# Patient Record
Sex: Female | Born: 1965 | Race: White | Hispanic: No | Smoking: Current every day smoker
Health system: Southern US, Community
[De-identification: ages and names within clinical notes are randomized; demographics above are authoritative.]

## PROBLEM LIST (undated history)

## (undated) DIAGNOSIS — M549 Dorsalgia, unspecified: Secondary | ICD-10-CM

## (undated) DIAGNOSIS — I10 Essential (primary) hypertension: Secondary | ICD-10-CM

## (undated) DIAGNOSIS — B1920 Unspecified viral hepatitis C without hepatic coma: Secondary | ICD-10-CM

## (undated) DIAGNOSIS — J449 Chronic obstructive pulmonary disease, unspecified: Secondary | ICD-10-CM

## (undated) DIAGNOSIS — E119 Type 2 diabetes mellitus without complications: Secondary | ICD-10-CM

## (undated) DIAGNOSIS — F1910 Other psychoactive substance abuse, uncomplicated: Secondary | ICD-10-CM

## (undated) DIAGNOSIS — G8929 Other chronic pain: Secondary | ICD-10-CM

## (undated) HISTORY — PX: BACK SURGERY: SHX140

## (undated) HISTORY — PX: KNEE ARTHROSCOPY: SUR90

## (undated) HISTORY — PX: EXTERNAL FIXATION ARM: SHX1552

## (undated) HISTORY — PX: TUBAL LIGATION: SHX77

## (undated) HISTORY — PX: TONSILLECTOMY: SUR1361

---

## 1997-10-14 ENCOUNTER — Emergency Department (HOSPITAL_COMMUNITY): Admission: EM | Admit: 1997-10-14 | Discharge: 1997-10-15 | Payer: Self-pay | Admitting: Emergency Medicine

## 1997-10-15 ENCOUNTER — Ambulatory Visit (HOSPITAL_COMMUNITY): Admission: RE | Admit: 1997-10-15 | Discharge: 1997-10-15 | Payer: Self-pay | Admitting: Emergency Medicine

## 1997-12-24 ENCOUNTER — Ambulatory Visit (HOSPITAL_COMMUNITY): Admission: RE | Admit: 1997-12-24 | Discharge: 1997-12-24 | Payer: Self-pay | Admitting: Family Medicine

## 1998-10-24 ENCOUNTER — Encounter: Payer: Self-pay | Admitting: *Deleted

## 1998-10-24 ENCOUNTER — Ambulatory Visit (HOSPITAL_COMMUNITY): Admission: RE | Admit: 1998-10-24 | Discharge: 1998-10-24 | Payer: Self-pay | Admitting: *Deleted

## 1998-10-27 ENCOUNTER — Ambulatory Visit (HOSPITAL_COMMUNITY): Admission: RE | Admit: 1998-10-27 | Discharge: 1998-10-27 | Payer: Self-pay | Admitting: Family Medicine

## 1998-10-27 ENCOUNTER — Encounter: Payer: Self-pay | Admitting: Family Medicine

## 1999-01-04 ENCOUNTER — Encounter: Admission: RE | Admit: 1999-01-04 | Discharge: 1999-04-04 | Payer: Self-pay | Admitting: Anesthesiology

## 1999-07-28 ENCOUNTER — Encounter: Admission: RE | Admit: 1999-07-28 | Discharge: 1999-10-26 | Payer: Self-pay | Admitting: Anesthesiology

## 2000-04-03 ENCOUNTER — Emergency Department (HOSPITAL_COMMUNITY): Admission: EM | Admit: 2000-04-03 | Discharge: 2000-04-03 | Payer: Self-pay | Admitting: Emergency Medicine

## 2000-04-03 ENCOUNTER — Encounter: Payer: Self-pay | Admitting: Emergency Medicine

## 2000-06-21 ENCOUNTER — Emergency Department (HOSPITAL_COMMUNITY): Admission: EM | Admit: 2000-06-21 | Discharge: 2000-06-21 | Payer: Self-pay | Admitting: Emergency Medicine

## 2000-06-21 ENCOUNTER — Encounter: Payer: Self-pay | Admitting: Emergency Medicine

## 2001-02-11 ENCOUNTER — Encounter: Payer: Self-pay | Admitting: Emergency Medicine

## 2001-02-11 ENCOUNTER — Emergency Department (HOSPITAL_COMMUNITY): Admission: EM | Admit: 2001-02-11 | Discharge: 2001-02-11 | Payer: Self-pay | Admitting: Emergency Medicine

## 2001-10-20 ENCOUNTER — Emergency Department (HOSPITAL_COMMUNITY): Admission: EM | Admit: 2001-10-20 | Discharge: 2001-10-20 | Payer: Self-pay | Admitting: Emergency Medicine

## 2002-04-28 ENCOUNTER — Emergency Department (HOSPITAL_COMMUNITY): Admission: AC | Admit: 2002-04-28 | Discharge: 2002-04-29 | Payer: Self-pay

## 2002-04-28 ENCOUNTER — Encounter: Payer: Self-pay | Admitting: Emergency Medicine

## 2002-05-07 ENCOUNTER — Encounter: Admission: RE | Admit: 2002-05-07 | Discharge: 2002-06-11 | Payer: Self-pay | Admitting: *Deleted

## 2002-07-31 ENCOUNTER — Inpatient Hospital Stay (HOSPITAL_COMMUNITY): Admission: EM | Admit: 2002-07-31 | Discharge: 2002-08-05 | Payer: Self-pay | Admitting: *Deleted

## 2002-07-31 ENCOUNTER — Encounter: Payer: Self-pay | Admitting: *Deleted

## 2002-07-31 ENCOUNTER — Encounter: Payer: Self-pay | Admitting: Emergency Medicine

## 2002-08-01 ENCOUNTER — Encounter: Payer: Self-pay | Admitting: *Deleted

## 2002-08-02 ENCOUNTER — Encounter: Payer: Self-pay | Admitting: *Deleted

## 2002-08-04 ENCOUNTER — Encounter: Payer: Self-pay | Admitting: Internal Medicine

## 2002-08-05 ENCOUNTER — Inpatient Hospital Stay (HOSPITAL_COMMUNITY): Admission: EM | Admit: 2002-08-05 | Discharge: 2002-08-09 | Payer: Self-pay | Admitting: Psychiatry

## 2002-10-28 ENCOUNTER — Encounter: Payer: Self-pay | Admitting: Neurosurgery

## 2002-10-28 ENCOUNTER — Encounter: Admission: RE | Admit: 2002-10-28 | Discharge: 2002-10-28 | Payer: Self-pay | Admitting: Neurosurgery

## 2002-11-04 ENCOUNTER — Emergency Department (HOSPITAL_COMMUNITY): Admission: EM | Admit: 2002-11-04 | Discharge: 2002-11-05 | Payer: Self-pay | Admitting: Emergency Medicine

## 2002-11-05 ENCOUNTER — Encounter: Payer: Self-pay | Admitting: Emergency Medicine

## 2002-11-24 ENCOUNTER — Inpatient Hospital Stay (HOSPITAL_COMMUNITY): Admission: AD | Admit: 2002-11-24 | Discharge: 2002-11-29 | Payer: Self-pay | Admitting: Psychiatry

## 2002-12-02 ENCOUNTER — Encounter
Admission: RE | Admit: 2002-12-02 | Discharge: 2003-03-02 | Payer: Self-pay | Admitting: Physical Medicine & Rehabilitation

## 2003-03-26 ENCOUNTER — Encounter
Admission: RE | Admit: 2003-03-26 | Discharge: 2003-06-24 | Payer: Self-pay | Admitting: Physical Medicine & Rehabilitation

## 2003-04-12 ENCOUNTER — Encounter: Payer: Self-pay | Admitting: Emergency Medicine

## 2003-04-12 ENCOUNTER — Inpatient Hospital Stay (HOSPITAL_COMMUNITY): Admission: EM | Admit: 2003-04-12 | Discharge: 2003-04-21 | Payer: Self-pay | Admitting: Psychiatry

## 2003-04-26 ENCOUNTER — Emergency Department (HOSPITAL_COMMUNITY): Admission: EM | Admit: 2003-04-26 | Discharge: 2003-04-26 | Payer: Self-pay

## 2003-06-30 ENCOUNTER — Encounter
Admission: RE | Admit: 2003-06-30 | Discharge: 2003-09-28 | Payer: Self-pay | Admitting: Physical Medicine & Rehabilitation

## 2003-07-09 ENCOUNTER — Emergency Department (HOSPITAL_COMMUNITY): Admission: EM | Admit: 2003-07-09 | Discharge: 2003-07-09 | Payer: Self-pay | Admitting: Emergency Medicine

## 2003-07-13 ENCOUNTER — Inpatient Hospital Stay (HOSPITAL_COMMUNITY): Admission: EM | Admit: 2003-07-13 | Discharge: 2003-07-16 | Payer: Self-pay | Admitting: Emergency Medicine

## 2003-07-16 ENCOUNTER — Inpatient Hospital Stay (HOSPITAL_COMMUNITY): Admission: RE | Admit: 2003-07-16 | Discharge: 2003-07-25 | Payer: Self-pay | Admitting: *Deleted

## 2003-11-25 ENCOUNTER — Encounter
Admission: RE | Admit: 2003-11-25 | Discharge: 2004-01-24 | Payer: Self-pay | Admitting: Physical Medicine & Rehabilitation

## 2003-11-26 ENCOUNTER — Ambulatory Visit: Payer: Self-pay | Admitting: Physical Medicine & Rehabilitation

## 2004-01-24 ENCOUNTER — Encounter
Admission: RE | Admit: 2004-01-24 | Discharge: 2004-03-23 | Payer: Self-pay | Admitting: Physical Medicine & Rehabilitation

## 2004-01-26 ENCOUNTER — Ambulatory Visit: Payer: Self-pay | Admitting: Physical Medicine & Rehabilitation

## 2004-03-23 ENCOUNTER — Encounter
Admission: RE | Admit: 2004-03-23 | Discharge: 2004-06-21 | Payer: Self-pay | Admitting: Physical Medicine & Rehabilitation

## 2004-03-27 ENCOUNTER — Ambulatory Visit: Payer: Self-pay | Admitting: Physical Medicine & Rehabilitation

## 2004-05-17 ENCOUNTER — Ambulatory Visit: Payer: Self-pay | Admitting: Physical Medicine & Rehabilitation

## 2004-08-11 ENCOUNTER — Encounter
Admission: RE | Admit: 2004-08-11 | Discharge: 2004-11-09 | Payer: Self-pay | Admitting: Physical Medicine & Rehabilitation

## 2004-08-15 ENCOUNTER — Ambulatory Visit: Payer: Self-pay | Admitting: Physical Medicine & Rehabilitation

## 2004-08-18 ENCOUNTER — Encounter
Admission: RE | Admit: 2004-08-18 | Discharge: 2004-08-18 | Payer: Self-pay | Admitting: Physical Medicine & Rehabilitation

## 2004-09-28 ENCOUNTER — Ambulatory Visit: Payer: Self-pay | Admitting: Physical Medicine & Rehabilitation

## 2004-11-22 ENCOUNTER — Encounter
Admission: RE | Admit: 2004-11-22 | Discharge: 2005-02-20 | Payer: Self-pay | Admitting: Physical Medicine & Rehabilitation

## 2004-12-08 ENCOUNTER — Ambulatory Visit: Payer: Self-pay | Admitting: Physical Medicine & Rehabilitation

## 2005-01-09 ENCOUNTER — Encounter
Admission: RE | Admit: 2005-01-09 | Discharge: 2005-04-09 | Payer: Self-pay | Admitting: Physical Medicine & Rehabilitation

## 2005-01-09 ENCOUNTER — Ambulatory Visit: Payer: Self-pay | Admitting: Physical Medicine & Rehabilitation

## 2005-04-14 ENCOUNTER — Ambulatory Visit (HOSPITAL_COMMUNITY): Admission: RE | Admit: 2005-04-14 | Discharge: 2005-04-14 | Payer: Self-pay | Admitting: Neurology

## 2005-05-17 ENCOUNTER — Emergency Department (HOSPITAL_COMMUNITY): Admission: EM | Admit: 2005-05-17 | Discharge: 2005-05-17 | Payer: Self-pay | Admitting: Emergency Medicine

## 2005-05-24 ENCOUNTER — Encounter: Admission: RE | Admit: 2005-05-24 | Discharge: 2005-06-12 | Payer: Self-pay | Admitting: Orthopedic Surgery

## 2005-07-18 ENCOUNTER — Encounter: Payer: Self-pay | Admitting: Family Medicine

## 2005-07-31 ENCOUNTER — Ambulatory Visit: Payer: Self-pay | Admitting: Pulmonary Disease

## 2005-07-31 ENCOUNTER — Inpatient Hospital Stay (HOSPITAL_COMMUNITY): Admission: RE | Admit: 2005-07-31 | Discharge: 2005-08-01 | Payer: Self-pay | Admitting: Orthopedic Surgery

## 2005-08-21 ENCOUNTER — Inpatient Hospital Stay (HOSPITAL_COMMUNITY): Admission: RE | Admit: 2005-08-21 | Discharge: 2005-08-24 | Payer: Self-pay | Admitting: Orthopedic Surgery

## 2005-08-23 ENCOUNTER — Encounter (INDEPENDENT_AMBULATORY_CARE_PROVIDER_SITE_OTHER): Payer: Self-pay | Admitting: Interventional Cardiology

## 2005-09-20 ENCOUNTER — Other Ambulatory Visit: Admission: RE | Admit: 2005-09-20 | Discharge: 2005-09-20 | Payer: Self-pay | Admitting: Family Medicine

## 2006-01-09 ENCOUNTER — Encounter: Admission: RE | Admit: 2006-01-09 | Discharge: 2006-01-09 | Payer: Self-pay | Admitting: Orthopedic Surgery

## 2006-01-13 ENCOUNTER — Encounter: Admission: RE | Admit: 2006-01-13 | Discharge: 2006-01-13 | Payer: Self-pay | Admitting: Orthopedic Surgery

## 2006-01-22 ENCOUNTER — Encounter: Admission: RE | Admit: 2006-01-22 | Discharge: 2006-02-27 | Payer: Self-pay | Admitting: Orthopedic Surgery

## 2006-05-06 ENCOUNTER — Encounter: Admission: RE | Admit: 2006-05-06 | Discharge: 2006-05-06 | Payer: Self-pay | Admitting: Orthopedic Surgery

## 2006-07-03 ENCOUNTER — Ambulatory Visit (HOSPITAL_BASED_OUTPATIENT_CLINIC_OR_DEPARTMENT_OTHER): Admission: RE | Admit: 2006-07-03 | Discharge: 2006-07-03 | Payer: Self-pay | Admitting: Orthopedic Surgery

## 2006-07-23 ENCOUNTER — Encounter: Admission: RE | Admit: 2006-07-23 | Discharge: 2006-09-10 | Payer: Self-pay | Admitting: Orthopedic Surgery

## 2007-04-30 ENCOUNTER — Emergency Department (HOSPITAL_COMMUNITY): Admission: EM | Admit: 2007-04-30 | Discharge: 2007-04-30 | Payer: Self-pay | Admitting: Family Medicine

## 2007-05-20 ENCOUNTER — Encounter: Admission: RE | Admit: 2007-05-20 | Discharge: 2007-05-20 | Payer: Self-pay | Admitting: Orthopedic Surgery

## 2007-05-23 ENCOUNTER — Emergency Department (HOSPITAL_COMMUNITY): Admission: EM | Admit: 2007-05-23 | Discharge: 2007-05-23 | Payer: Self-pay | Admitting: Emergency Medicine

## 2007-06-04 ENCOUNTER — Encounter (INDEPENDENT_AMBULATORY_CARE_PROVIDER_SITE_OTHER): Payer: Self-pay | Admitting: Orthopedic Surgery

## 2007-06-04 ENCOUNTER — Inpatient Hospital Stay (HOSPITAL_COMMUNITY): Admission: RE | Admit: 2007-06-04 | Discharge: 2007-06-07 | Payer: Self-pay | Admitting: Orthopedic Surgery

## 2009-03-13 ENCOUNTER — Emergency Department (HOSPITAL_COMMUNITY): Admission: EM | Admit: 2009-03-13 | Discharge: 2009-03-14 | Payer: Self-pay | Admitting: Emergency Medicine

## 2009-03-17 ENCOUNTER — Emergency Department (HOSPITAL_COMMUNITY): Admission: EM | Admit: 2009-03-17 | Discharge: 2009-03-17 | Payer: Self-pay | Admitting: Emergency Medicine

## 2009-11-16 ENCOUNTER — Emergency Department (HOSPITAL_COMMUNITY): Admission: EM | Admit: 2009-11-16 | Discharge: 2009-11-16 | Payer: Self-pay | Admitting: Emergency Medicine

## 2009-12-23 ENCOUNTER — Emergency Department (HOSPITAL_COMMUNITY): Admission: EM | Admit: 2009-12-23 | Discharge: 2009-12-23 | Payer: Self-pay | Admitting: Family Medicine

## 2010-02-10 ENCOUNTER — Emergency Department (HOSPITAL_COMMUNITY)
Admission: EM | Admit: 2010-02-10 | Discharge: 2010-02-10 | Payer: Self-pay | Source: Home / Self Care | Admitting: Family Medicine

## 2010-02-24 ENCOUNTER — Emergency Department (HOSPITAL_COMMUNITY)
Admission: EM | Admit: 2010-02-24 | Discharge: 2010-02-24 | Payer: Self-pay | Source: Home / Self Care | Admitting: Emergency Medicine

## 2010-03-08 ENCOUNTER — Encounter (INDEPENDENT_AMBULATORY_CARE_PROVIDER_SITE_OTHER): Payer: Self-pay | Admitting: *Deleted

## 2010-03-08 LAB — CONVERTED CEMR LAB
Albumin: 4 g/dL (ref 3.5–5.2)
Alkaline Phosphatase: 62 units/L (ref 39–117)
Chloride: 101 meq/L (ref 96–112)
Glucose, Bld: 142 mg/dL — ABNORMAL HIGH (ref 70–99)
LDL Cholesterol: 109 mg/dL — ABNORMAL HIGH (ref 0–99)
Potassium: 4.2 meq/L (ref 3.5–5.3)
Sodium: 138 meq/L (ref 135–145)
TSH: 0.671 microintl units/mL (ref 0.350–4.500)
Total Protein: 6.9 g/dL (ref 6.0–8.3)
Triglycerides: 107 mg/dL (ref <150)

## 2010-04-11 ENCOUNTER — Emergency Department (HOSPITAL_COMMUNITY)
Admission: EM | Admit: 2010-04-11 | Discharge: 2010-04-11 | Payer: Self-pay | Source: Home / Self Care | Admitting: Family Medicine

## 2010-05-29 LAB — POCT URINALYSIS DIPSTICK
Bilirubin Urine: NEGATIVE
Glucose, UA: NEGATIVE mg/dL
Nitrite: NEGATIVE
Protein, ur: NEGATIVE mg/dL
Specific Gravity, Urine: 1.01 (ref 1.005–1.030)
Urobilinogen, UA: 0.2 mg/dL (ref 0.0–1.0)

## 2010-06-19 LAB — POCT I-STAT, CHEM 8
Calcium, Ion: 1.17 mmol/L (ref 1.12–1.32)
Chloride: 102 mEq/L (ref 96–112)
Glucose, Bld: 113 mg/dL — ABNORMAL HIGH (ref 70–99)
HCT: 35 % — ABNORMAL LOW (ref 36.0–46.0)
TCO2: 31 mmol/L (ref 0–100)

## 2010-06-19 LAB — DIFFERENTIAL
Basophils Absolute: 0 10*3/uL (ref 0.0–0.1)
Basophils Absolute: 0 10*3/uL (ref 0.0–0.1)
Eosinophils Absolute: 0.1 10*3/uL (ref 0.0–0.7)
Eosinophils Relative: 1 % (ref 0–5)
Eosinophils Relative: 2 % (ref 0–5)
Lymphocytes Relative: 19 % (ref 12–46)
Lymphocytes Relative: 36 % (ref 12–46)
Lymphs Abs: 1.3 10*3/uL (ref 0.7–4.0)
Neutro Abs: 5 10*3/uL (ref 1.7–7.7)
Neutrophils Relative %: 72 % (ref 43–77)
Neutrophils Relative %: 56 % (ref 43–77)

## 2010-06-19 LAB — CBC
HCT: 35.3 % — ABNORMAL LOW (ref 36.0–46.0)
HCT: 39.1 % (ref 36.0–46.0)
MCV: 90.6 fL (ref 78.0–100.0)
Platelets: 177 10*3/uL (ref 150–400)
Platelets: 224 10*3/uL (ref 150–400)
RDW: 13.5 % (ref 11.5–15.5)
RDW: 13.2 % (ref 11.5–15.5)
WBC: 7 10*3/uL (ref 4.0–10.5)
WBC: 6.4 10*3/uL (ref 4.0–10.5)

## 2010-06-19 LAB — CULTURE, ROUTINE-ABSCESS

## 2010-08-01 NOTE — Op Note (Signed)
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   OPERATIVE PROCEDURE:  1,  Fusion, L4-5.  1. Harvest right posterior iliac crest graft.  2. Reinsertion pedicle screws L4, L5, S1 bilaterally.   OPERATIVE NOTE:  The patient was placed under general endotracheal  anesthesia.  A Foley catheter was placed in the bladder.  Sequential  compression devices were placed on both lower extremities.  Intravenous  antibiotics had been infused preoperatively.   She was positioned prone on a Jackson frame.  Care was taken to position  the upper extremities so as to avoid hyperflexion and hyperabduction of  the shoulders and so as to avoid hyperflexion of the elbows.  The upper  extremities were padded from axilla to hands with foam.  The hips and  knees were gently flexed and supported on pillows.   The previous midline incision was marked with a skin marker.  A proposed  site of the bone graft harvest incision was also marked on the skin.  The skin was prepped using DuraPrep and the operative field draped in  rectangular fashion and drapes secured with Ioban.   The subcutaneous tissue was injected with a mixture of 0.25% plain  Marcaine and 1% lidocaine with epinephrine.  I then excised the previous  incision in elliptical fashion.  Dissection was carried down onto the  spinous process of L4 and L3 and carried distally to the posterior  aspect of the sacrum.   Cross-table lateral radiograph was taken with a  Kocher which was aligned almost exactly with the proximal screws at L4.  The paraspinal scar and muscle was then mobilized laterally to the right  side and then to the left.  The set screws in the couplings to the rod  were removed, the rod removed and then each screw removed on either  side.  The L4 screws were grossly loose as they had appeared to be on  radiographs.  The L5 screws were not as tight as expected but not  grossly loose.  Those screws at S1 were very tight bilaterally.   Having removed the screws, we excised the scar from the posterior  elements bilaterally at L4-5 and L5-S1.  We meticulously curetted away  the scar from the bony elements and the fusion appeared solid at L5-S1  and there was obvious motion bilaterally at the facet joints at L4-5.   Next we harvested a right posterior iliac crest graft through the  separate oblique incision.  This was carried down to the gluteal fascia,  the gluteal fascia detached from the outer edge of the iliac crest.  Muscle was elevated off the outer table of the ilium with the Cobb  elevator.  We then harvested a moderate quantity of graft using a gouge.  The wound was irrigated and Gelfoam packed into the bony defect.  The  gluteal fascia was then reattached to the iliac crest using interrupted  figure-of-eight #1 Vicryl sutures.   Next we used a high-speed bur to decorticate the lamina and inferior  articular process of L4 on the right side and the L5 superior articular  process and intact fusion mass distally.  Next we placed screws on the  right side, 7.5 mm diameter screws at S1 and L5 and an 8.5 mm screw at  L4.  Depths were based on measurements to the bottom of the holes.  Each  screw was stimulated and distal EMG activity monitored.  In each  instance there was no stimulation within the levels of concern, making  it very unlikely that there was any contact between a screw  thread and  nerve root.   Next we debrided scar away on the left side and prepared the bone in  similar fashion to that described on the right.  There was a fair amount  of unincorporated beta tricalcium phosphate in the scar tissue, which  was submitted to pathology.   We then replaced the screws on the left side as described for the right  side using 7.5-mm diameter screws at S1 and L5 and an 8.5-mm screw at  L4, the grossly loose level.  Each screw was again stimulated and the  levels of stimulation necessary to create distal EMG activity were at a  safe zone.   Rods were contoured and placed first on the right side and attached  provisionally.  A cross-table lateral radiograph was taken, which showed  satisfactory condition of the implants.  The left-sided rod was then  contoured and attached to the left side.  All the couplings were  torqued.  A PA radiograph was taken.  This showed satisfactory position  of screws.   Next we had had a small package of Infuse prepared.  Collagen sponge and  bone graft were packed in at the L4-5 interval bilaterally posteriorly  with special attention to the facet joints, thus performing the fusion.   A 15-gauge Blake drain was then placed subfascially and brought out  through the skin on the right side and secured to the skin with a 2-0  nylon suture.  Thoracolumbar fascia was then closed using continuous #1  Vicryl suture.  A one-eighth inch Hemovac drain was then run from the  central wound through the lateral bone graft harvest wound through to  the skin of the right side and secured with a 2-0 nylon suture.  The  subcutaneous tissue of both wounds was closed using interrupted 2-0  Vicryl and continuous 2-0 Vicryl.  Skin of both wounds was closed using  subcuticular 3-0 undyed Vicryl in running fashion.  The skin edges were  reinforced with Steri-Strips.  An antibiotic ointment dressing was  applied and secured with OpSite.   At the time  of dictation I had not yet examined the patient in the  recovery room so no neurologic examination is reported here.  Sponge and  needle counts were correct.  There were no intraoperative complications.      Nelda Severe, MD  Electronically Signed     MT/MEDQ  D:  06/04/2007  T:  06/04/2007  Job:  [id number]

## 2010-08-01 NOTE — Discharge Summary (Signed)
Mallory Medina, GALES NO.:  [id number]   MEDICAL RECORD NO.:  [id number]          PATIENT TYPE:  INP   LOCATION:  5033                         FACILITY:  [facility]   PHYSICIAN:  Nelda Severe, MD      DATE OF BIRTH:  Jan 19, 1966   DATE OF ADMISSION:  06/04/2007  DATE OF DISCHARGE:  06/07/2007                               DISCHARGE SUMMARY   This 45 year old woman was admitted for management of L4-L5  pseudoarthrosis status post L4-S1 fusion.  She had disabling back pain.   The day of admission, she was taken to the operating room where her  pedicle fixation was revised, bone graft harvested and placed in  addition to infuse.  Postoperatively, the course has been relatively  uncomplicated, although, she did have some lateral thigh pain in the  right side.  A followup CT scan was taken.  This shows no evidence of  nerve root compression, although the L5 screws did not follow the exact  trajectory of the previously placed screws.   At the present time, her wound is stable.  Drains have been removed.  The dressing has been changed today.  She is ambulatory with a walker.  She can get in and out of bed on her own.  She can tolerate oral intake  and diet.  She is able to void voluntarily.   She is being discharged at this time with a prescription for Norco 10, 1-  2 q.4-6 h. p.r.n. maximum 8 per day 120 tablets.  She will continue with  all other medications, which she has at home.   She will be followed up in 1 month's time in the office.   She is to avoid bending and lifting.  She is to call me for any  persistent wound drainage or fever.   FINAL DIAGNOSIS:  Lumbar spondylosis, status post fusion with  pseudoarthrosis L4-L5.   CONDITION ON DISCHARGE:  Stable, ambulatory.      Nelda Severe, MD  Electronically Signed     MT/MEDQ  D:  06/07/2007  T:  06/07/2007  Job:  [id number]
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
## 2010-08-04 NOTE — H&P (Signed)
NAME:  Mallory, Medina NO.:  [id number]   MEDICAL RECORD NO.:  [id number]                   PATIENT TYPE:  IPS   LOCATION:  0303                                 FACILITY[facility]  [facility]   PHYSICIAN:  Geoffery Lyons, M.D.                   DATE OF BIRTH:  [birth year]   DATE OF ADMISSION:  11/24/2002  DATE OF DISCHARGE:                         PSYCHIATRIC ADMISSION ASSESSMENT   IDENTIFYING INFORMATION:  Mallory Medina patient is a 45 year old separated white  female voluntarily admitted on November 24, 2002.   HISTORY OF PRESENT ILLNESS:  Mallory Medina patient presents with a history of  intentional overdose, taking 10 Ambien tablets on Monday while she was at  home.  Her stressors are she reports a recent breakup with her boyfriend.  She states it was an impulsive act.  She reports, though, that she was  hoping it would kill herself.  Mallory Medina patient left a note to whoever.  She is  not sure who found out.  She states she was also smoking crack cocaine and  that her relapsing and breakup with Mallory Medina boyfriend added to her suicidal  thoughts and current suicide attempt.  She denies any psychotic symptoms.  She reports she has been sleeping and appetite has been satisfactory.   PAST PSYCHIATRIC HISTORY:  This is Mallory Medina second hospitalization to [facility], [facility].  Mallory Medina patient had a suicide attempt before, she overdosed on  Tylenol.  She sees a therapist, Markus Jarvis, at [facility].  No  other psychiatric admissions.   SUBSTANCE ABUSE HISTORY:  Mallory Medina patient smokes.  She drinks alcohol on a rare  basis.  She has been smoking marijuana.   PAST MEDICAL HISTORY:  Primary care [facility] Loyer: Dr. Fulton Mole.  Medical  problems: Chronic back pain.   MEDICATIONS:  1. Wellbutrin XL 450 mg.  2. Protonix 40 mg daily.  3. Neurontin 300 mg t.i.d.  4. Symmetrel 100 mg b.i.d.  5. Symbyax 50/12 mg; has been on that for two months.   DRUG ALLERGIES:  SULFA.   PHYSICAL EXAMINATION:  Physical  was done while Mallory Medina patient was medically  cleared for her overdose in [facility].  Mallory Medina  patient appears in no acute distress today.  Sleepy, but otherwise stable.  She voices no complaints from overdose.   LABORATORY DATA:  Alcohol level was less than 5.  CMET was within normal  limits.  CBC: Within normal limits.  Urine drug screen was positive for THC,  positive for cocaine.   SOCIAL HISTORY:  She is a 45 year old separated white female, separated for  two years, married for one year.  She lives with her mother and her  daughter.  She has three children ages 81, 23, and 21.  Her two youngest  children are with their biological father.  Her oldest is currently with her  mother.  Mallory Medina 29 year old is pregnant.  Mallory Medina patient completed [facility]ory [facility] 10th grade  and is not working at this time.   FAMILY HISTORY:  Mallory Medina patient denies.   MENTAL STATUS EXAM:  She is an alert and oriented female, cooperative, fair  eye contact.  Speech is clear.  Mood is depressed.  Affect: Mallory Medina patient  appears sad and some mild depression.  Thought processes are coherent; no  evidence of psychosis, no suicidal or homicidal ideation or auditory and  visual hallucinations.  Cognitive functioning: Intact.  Memory is fair.  Judgment is fair.  Insight is limited.   ADMISSION DIAGNOSES:   AXIS I:  1. Bipolar disorder.  2. Cocaine abuse.   AXIS II:  Deferred.   AXIS III:  None.   AXIS IV:  Problems with primary support group, other psychosocial problems.   AXIS V:  Current is 30, this past year is 63.   INITIAL PLAN OF CARE:  Plan is a voluntary admission to [facility] for intentional overdose.  Contract for safety, check every 15  minutes.  Will stabilize mood and thinking so Mallory Medina patient can be safe.  Will  resume her medications.  Will put a nicotine patch on as Mallory Medina patient smokes.  Will encourage group activity.  Mallory Medina patient is to increase her coping skills  by attending  groups.  Will have trazodone available for sleep.  Mallory Medina patient  is to remain alcohol and drug free.  Mallory Medina patient is to follow up with mental  health and be medication compliant.   ESTIMATED LENGTH OF STAY:  Three to five days.     Landry Corporal, N.P.                       Geoffery Lyons, M.D.    JO/MEDQ  D:  11/26/2002  T:  11/27/2002  Job:  [id number]

## 2010-08-04 NOTE — Assessment & Plan Note (Signed)
HISTORY OF PRESENT ILLNESS:  Mallory Medina is here for follow up of her low back  pain.  She had two transforaminal injections performed by Dr. Wynn Banker.  She felt some temporary benefits, particularly in the legs, but the effect  was not long lasting.  Unfortunately, on April 26, the patient attempted  suicide with poly medications and was admitted to ICU and eventually  Behavioral Health.  She was followed by Dr. Kathrynn Running who made multiple  adjustments with her medications.  The patient is attempting to get into an  inpatient transitional program to help with __________ treatment service.   CURRENT MEDICATIONS:  1. Seroquel 50 mg t.i.d.  2. Cymbalta 60 mg daily.  3. Neurontin 300 mg b.i.d. and 900 mg q.h.s.  4. Oxcarbazepine 300 mg half tablet t.i.d.  5. Trazodone 300 mg q.h.s.  6. Amantadine 100 mg b.i.d.   The patient states that she feels that the medications are helping.  They  have helped to some extent with her pain.  The pain is mostly still in the  low back radiating down the posterior legs.  She rates her pain on an  average of 8/10.  Made worse with walking, bending, sitting or working.  Improves with rest and her medications.  She is not using anything currently  from a narcotic standpoint for her pain.   REVIEW OF SYSTEMS:  The patient denies any chest pain, shortness of breath,  cold or flu symptoms.  She has had occasional wheezing and weakness and  numbness in the lower extremities.  She has significant psychiatric issues  as mentioned above.  Although, she is improving.  She denies confusion,  vertigo, agitation or headaches.  She has had some reflux, but denies  nausea, vomiting, diarrhea or constipation.  The patient has had some weight  gain, skin changes, fever and chills.   PHYSICAL EXAMINATION:  VITAL SIGNS:  Blood pressure 122/72, pulse 105,  respirations 16. Saturating 95% on room air.  GENERAL APPEARANCE:  The patient walks fairly normal with a slightly wide  based gait.  She is alert, generally flat in affect with normal grooming.  BACK:  Examination of the low back showed pain with palpation of the  lumbosacral spine and paraspinal muscles.  She has elevation of the right  hemipelvis with some clockwise rotation of the pelvis and spine.  She had  some restriction of movement with rightward bending as well as forward  flexion.  She was able to flex to about 45-60 degrees.  She seemed to have  better flexibility compared to last time.  Forward flexion did cause  discomfort.  Extension relieved pain and she was able to extend it to  approximately 20 degrees.  Reflexes were essentially 2+ at the knees and  left ankle.  The right ankle was traced.  Motor function was really 5/5  except for pain inhibition.  Sensory, again, was grossly intact.  Straight-  leg testing was equivocal and Patrick's test was negative.   ASSESSMENT:  1. Post laminectomy syndrome.  2. Lumbar radiculopathy.  3. Manic depression status post suicide attempts.   PLAN:  1. I have noted multiple medication changes and several of these could be     helpful for her pain including the Cymbalta, Oxcarbazepine and Neurontin.     Hopefully, she will get some mood stabilization from these as well as the     other medications.  2. From the standpoint of pure pain relief, we will start her on  low-dose     Kadian once again 20 mg daily.  She had felt great relief with this     before.  I gave her a sample voucher for two weeks worth which should     have a very low abuse potential.  Further scripts will be written out in     two week intervals, or they will be held by her mother.  3. Agree with the residential treatment service program.  Certainly, we need     to get a handle on her lewd aspects of pain to treat her pain in general.  4. I will see the patient back in approximately 5-6 weeks time after her     inpatient admission which may take place tomorrow.      Ranelle Oyster, M.D.   ZTS/MedQ  D:  07/27/2003 13:52:44  T:  07/27/2003 14:47:31  Job #:  [id number]   cc:   Dianne Dun  [facility]

## 2010-08-04 NOTE — Discharge Summary (Signed)
NAMENICHELLE, RENWICK NO.:  [id number]   MEDICAL RECORD NO.:  [id number]          PATIENT TYPE:  INP   LOCATION:  5035                         FACILITY:  [facility]   PHYSICIAN:  Nelda Severe, MD      DATE OF BIRTH:  [birth year]   DATE OF ADMISSION:  08/21/2005  DATE OF DISCHARGE:  08/24/2005                                 DISCHARGE SUMMARY   This 45 year old woman was admitted to the hospital on August 21, 2005, for  management of chronic severe lumbar pain.  The day of admission, she was  taken to surgery were a right L4-5, L5-S1 laminectomies and L4-5, L5-S1  fusion with local bone graft was carried out.  Pedicle screws and rods were  used as well.   Postoperatively, she did quite well except that she had an EKG done because  she was tachycardiac on either the late evening of August 21, 2005, or the  early morning of August 22, 2005, which showed abnormalities.  She was then  assessed by the [facility].  Troponins were negative.  She  progressed satisfactorily with ambulation and transitioned onto hydrocodone  from intravenous narcotics.  She was discharged on August 24, 2005, the post  third postoperative day, by __________  assistant on my behalf.   CONDITION ON DISCHARGE:  Improved pain, ambulating with walker, stable  wound.   She was given a prescription for:  1. Lortab 10 mg, 60 tablets.  2. Flexeril 10 mg, 60 tablets by __________  .   She was instructed to follow up with Dr. Nicholos Johns, her family doctor, for  diabetes management, this having been diagnosed also while she was in the  hospital.   FINAL DIAGNOSIS:  Lumbar spondylosis and stenosis, status post laminectomy  and fusion.   Discharge instructions include avoidance of bending and lifting, following  up in the office in two week's time etc.      Nelda Severe, MD  Electronically Signed     MT/MEDQ  D:  10/24/2005  T:  10/24/2005  Job:  [id number]

## 2010-08-04 NOTE — Discharge Summary (Signed)
NAME:  Mallory Medina, Mallory Medina NO.:  [id number]   MEDICAL RECORD NO.:  [id number]                   PATIENT TYPE:  IPS   LOCATION:  0303                                 FACILITY:  [facility]   PHYSICIAN:  Geoffery Lyons, M.D.                   DATE OF BIRTH:  Jun 04, 1965   DATE OF ADMISSION:  11/24/2002  DATE OF DISCHARGE:  11/29/2002                                 DISCHARGE SUMMARY   CHIEF COMPLAINT AND PRESENT ILLNESS:  This was the second admission to [facility] for this 45 year old separated white female  voluntarily committed.  History of intentional overdose, taking 10 Ambien.  Her stressors, as she reports, are recent breakup with her boyfriend,  claimed that it was impulsive.  She was hoping it would kill herself.  The  patient left a note to whoever, not sure who found out.  Was also smoking  crack cocaine and her relapsing and breakup with boyfriend added to her  suicidal thoughts and current suicide attempts.  Denied any psychotic  symptoms.   PAST PSYCHIATRIC HISTORY:  Second time at [facility].  Suicide  attempt before, overdosed on Tylenol.  Goes to the [facility].   SUBSTANCE ABUSE HISTORY:  As already stated, using marijuana.  Drinks on  occasion.   PAST MEDICAL HISTORY:  Chronic back pain.   MEDICATIONS:  Wellbutrin XL 150 mg daily, Protonix 40 mg daily, Neurontin  300 mg three times a day, Symmetrel 100 mg twice a day and Symbyax 50\12 1  daily.   PHYSICAL EXAMINATION:  Performed and failed to show any acute findings.   LABORATORY DATA:  Alcohol level less than 5.  CMET within normal limits.  CBC within normal limits.  UDS was positive for marijuana and for cocaine.   MENTAL STATUS EXAM:  Alert, oriented female.  Cooperative.  Fair eye  contact.  Speech was clear.  Mood is depressed.  Affect appears sad, some  mild depression.  Thought processes are coherent.  No evidence of psychosis.  No suicidal or homicidal  ideation.  No auditory or visual hallucinations.  Cognition well-preserved.   ADMISSION DIAGNOSES:   AXIS I:  1. Cocaine and marijuana abuse.  2. Mood disorder not otherwise specified.   AXIS II:  No diagnosis.   AXIS III:  No diagnosis.   AXIS IV:  Moderate.   AXIS V:  Global Assessment of Functioning upon admission 30; highest Global  Assessment of Functioning in the last year 65.   HOSPITAL COURSE:  She was admitted and started intensive individual and  group psychotherapy.  She was maintained on the Wellbutrin, the Neurontin  and the Symbyax.  She was also given her Protonix.  She was started on  Neurontin 300 mg three times a day and she was given some trazodone for  sleep.  Did not sleep well on trazodone, so  she was placed on Restoril 15 mg  per day and her Neurontin was increased to 300 mg four times a day to help  her manage the pain.  She was given Vioxx 12.5 mg daily.  She did continue  to endorse pain, felt like her life was a mess.  Stress coming from, among  others, breaking up with the boyfriend, actively grieving the loss of the  relationship, could accept that there was not much to do.  Admitted that she  has used cocaine with long history of marijuana but still minimizing.  As we  increased the Neurontin, she seemed to start doing better but continued to  endorse depression and anxiety.  She found out that her 76 year old  daughter, who was pregnant, lost her baby.  She was hurting for her as well  as for herself.  Admitted that she was in emotional and physical pain.  She  was able to start opening up and did start working on some of the grief.  Sleep was an issue.  She was given trazodone for sleep, up to 300 mg per  night, that was effective.  On November 29, 2002, she was saying that she  was ready to be discharged.  She felt that she was doing really well.  Denied any suicidal or homicidal ideation.  She was wanting to be home for  her daughter.  Found  out that she got her old job back, which was very  encouraging as her boss lady was very supportive of her.  She was also going  to continue outpatient follow-up.  No suicidal ideation.  No homicidal  ideation.  We went ahead and discharged to outpatient treatment.   DISCHARGE DIAGNOSES:   AXIS I:  1. Mood disorder not otherwise specified.  2. Cocaine and marijuana abuse.   AXIS II:  No diagnosis.   AXIS III:  No diagnosis.   AXIS IV:  Moderate.   AXIS V:  Global Assessment of Functioning upon discharge 55.   DISCHARGE MEDICATIONS:  1. Wellbutrin XL 150 mg daily.  2. Protonix 40 mg daily.  3. Symmetrel 100 mg twice a day.  4. Abilify 1/2 in the morning.  5. Neurontin 400 mg four times a day.  6. Vioxx 25 mg daily.  7. Advair 100\50 puff twice a day.  8. Albuterol 2 puffs every four hours as needed.  9. Ultram 50 mg, 2 every eight hours as needed for pain.  10.      Trazodone 150 mg, 2 at night as needed for sleep.   FOLLOW UP:  [facility].                                              Geoffery Lyons, M.D.   IL/MEDQ  D:  12/23/2002  T:  12/24/2002  Job:  [id number]

## 2010-08-04 NOTE — Assessment & Plan Note (Signed)
Mallory Medina is back regarding her low back pain. We reviewed her MRI report,  which revealed annular tear centrally with disk bulge indenting upon the  ventral thecal sac at L4-5 with mild right neural foramen narrowing. Left  neural foramen was okay. At L5-S1, there were signs of the right  hemilaminectomy with decompression at the thecal sac. There was marked left  sided neural foramen narrowing; however, with moderate right neural  foraminal narrowing still. Mild disk bulging was seen with no marked central  canal narrowing. The patient continues to have significant bilateral lower  extremity symptoms particularly posterior as well as pain in the low back  region. The pain is rated at a 7/10. The Sterapred pack seemed to help to a  certain extent but the pain is increasing once again. She describes the pain  as stabbing. It interferes with general life, relations with others and  enjoyment of life on a moderate to severe level. The pain is worse with  bending, sitting and standing.   REVIEW OF SYMPTOMS:  The patient complains of some depression but this has  been stable. She denies any GU, GI, urinary, cardio or respiratory  symptomatology today.   SOCIAL HISTORY:  The patient is divorced but living with family in  [place]. 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   PHYSICAL EXAMINATION:  Blood pressure is 100/64, pulse is 98, respiratory  rate 16, she is sating 97% on room air. The patient is pleasant in no acute  distress. She is alert and oriented x3, affect is bright and appropriate.  Gait is slightly wide based but stable. The patient had about 30-40 degrees  of forward flexion with pain radiating into the legs and low back. Extension  was witnessed at 10-15 degrees with pain. Straight leg testing was positive.  Patrick's testing was equivocal. She had some continued tenderness over the  hips. Sensory reflux exam was essentially normal. Muscle strength was 5/5 in  all  four extremities.   ASSESSMENT:  1.  Status post lumbar laminectomy.  2.  Low back pain and leg pain associated with likely radiculopathy at L5      and S2.  3.  History of manic depression.  4.  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   PLAN:  1.  Will set up the patient for a nonselective epidural steroid injection at      L5-S1. Will schedule with Dr. Wynn Banker.  2.  In the meantime will maintain Kadian at 3 mg b.i.d. and Norco 10/325, 1      q.4 h p.r.n. for breakthrough pain.  3.  Continue Trileptal 300 mg t.i.d. for neuropathic pain.  4.  We discussed weight loss and lifestyle changes as well.  5.  I will see the patient back pending completion of her injections.       ZTS/MedQ  D:  08/30/2004 12:23:50  T:  08/30/2004 12:46:01  Job #:  [id number]   cc:   [name], M.D.  [address]  [place]  [place] [address]  Fax: [phone]

## 2010-08-04 NOTE — Op Note (Signed)
Mallory Medina, Mallory Medina                ACCOUNT NO.:  [id number]   MEDICAL RECORD NO.:  [id number]          PATIENT TYPE:  AMB   LOCATION:  DSC                          FACILITY:  [facility]   PHYSICIAN:  Harvie Junior, M.D.   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   POSTOPERATIVE DIAGNOSES:  1. 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  2. Chondromalacia of the medial femoral condyle.   PROCEDURE:  1. Right knee chondroplasty medial femoral condyle.  2. Right knee chondroplasty patellofemoral joint.  3. Right knee lateral retinacular release.  4. Left knee chondroplasty medial femoral condyle.  5. Chondroplasty of the patellofemoral joint.  6. Lateral retinacular release.   SURGEON:  Harvie Junior, M.D.   ASSISTANT:  Marshia Ly, P.A.   ANESTHESIA:  General.   BRIEF HISTORY:  Mallory Medina is a 45 year old female with a long history of  having significant bilateral knee pain with obvious tight lateral  retinaculum and chondromalacia of patella.  We had followed her for a  prolonged period of time.  She ultimately had some back surgery recently  and was out of work related to that and wanted to get this addressed  during her down time from back surgery as she was going on to heal and  we felt that it was appropriate to address this.  She had failed all  conservative care including antiinflammatory medication, injection  therapy and when all of this had failed, we ultimately felt that the  most appropriate course of action for her was going to be lateral  retinacular release and debridement of chondromalacia of patella.  She  was brought to the operating room for these procedures.   PROCEDURE:  The patient is  brought to the operating room and after  adequate anesthesia was obtained with general anesthetic, the patient  was placed up on the operating room table.  The right and left leg were  prepped and draped in the usual sterile fashion.  Following this,  attention was turned to the right leg where routine arthroscopic  examination revealed that there was some significant chondromalacia in  the medial femoral condyle which was debrided.  There was significant  grade 2 change in the lateral tibial plateau which was debrided.  Attention was then turned up into the patellofemoral joint where there  was grade 2 and 3 changes of the patellofemoral joint and the trochlea.  She had significant lateral tracking in the patella and a tight lateral  retinaculum.  At this point, an 18 gauge spinal needle was placed  through the skin at approximately 2 fingerbreadths proximal to the  patella and we did a lateral release from just proximal to this into the  muscle area on the lateral side distally down to the joint and down to  the portal and  then from the portal down to the joint line.  Excellent  release laterally was obtained.  The patella tracked more in the midline  and the pressure on the patella was dramatically reduced.  At this  point, the knee was copiously and thoroughly irrigated and suctioned  dry.  The portals were closed with a bandage.  A sterile compressive  dressing was applied.   Attention was turned to the left knee.  At this time, routine  arthroscopic examination of the left knee revealed there was some  chondromalacia in the medial femoral condyle, not as severe as in the  right knee, but certainly present.  This was debrided to a smooth and  stable rim and attention was turned towards the lateral side.  The  lateral side of the left knee looked pristine.  Up under the  patellofemoral joint, there was some grade 2 changes in the trochlea and  up under the patella.  These were  debrided to a smooth rim and attention  was turned to the lateral compartment where an 18 gauge spinal needle  was placed 2 fingerbreadths proximal to the patella and a lateral  retinacular release was performed through the lateral portal from just  proximal to this point into the muscle area all the way down to the  portal and then from the portal to the joint line.  Excellent midline  tracking was then achieved.  A final check was made for any loose  fragments or pieces of cartilage and to watch the new patellar tracking,  which is accomplished.  The knee was copiously and thoroughly irrigated,  and suctioned dry and the left knee was then closed with a sterile  compressive dressing and she was then taken to the recovery room and she  was noted to be in satisfactory condition.  She will be started on early  therapy and early range of motion.      Harvie Junior, M.D.  Electronically Signed     JLG/MEDQ  D:  07/03/2006  T:  07/03/2006  Job:  [id number]

## 2010-08-04 NOTE — Assessment & Plan Note (Signed)
Mallory Medina is back regarding her low back pain.  She comes to me with complaints  over the last two weeks of having diffuse body pain.  There is really no  particular area that is bothersome more than others.  The low back area is  chronic.  She states that her mood has been stable.  She says that her  energy has been down.  We increased her kadian to 30 mg b.i.d. at last visit  and added use of Trileptal.  She continues working 20 to 40 hours a week.  She is routinely stressed by psychosocial situations.  She does report  missing her last period.  She also continues to lose weight.   SOCIAL HISTORY:  The patient is working as above and her work time seems to  vary week to week.   REVIEW OF SYSTEMS:  The patient reports anxiety, depression, problems with  sleep, agitation, suicidal thoughts and headaches, most of which have been  in the remote past.  Reports reflux, poor appetite, bowel incontinence,  weight loss, and swelling.   PHYSICAL EXAMINATION:  VITAL SIGNS:  Blood pressure is 116/69, pulse is 85,  respiratory rate is 20, she is saturating 95% on room air.  NEUROLOGICAL:  The patient walks with a fairly normal gait.  PSYCHIATRIC:  Affect is a bit flat.  She does look very exhausted today.  Appearance is slightly disheveled.  EXTREMITIES:  The patient has some limitation in forward flexion at 50  degrees with low back pain.  Extension at 5 to 10 degrees seem to provoke  pain as well.  She had diffuse pain along the shoulders, trapezius region,  neck, mid-spine, hips, knees, and feet.  The knees revealed no swelling or  instability.  She had no weakness there either.  Sensory exam was normal.   IMPRESSION:  1.  Post laminectomy syndrome.  2.  Lumbar radiculopathy.  3.  Manic depression.  4.  Peripheral neuropathy related diabetes.   PLAN:  1.  Continue kadian 30 mg b.i.d.  2. Increase Trileptal to 300 mg t.i.d.  3.      She will continue Vicodin for breakthrough pain, 5/500 one  q.6h. p.r.n.      4. Continue weight loss.  5. May benefit from medical workup for      amenorrhea and generalized aches and pains, although nothing frankly to      contribute it to other than general peripheral sensitization response to      her low back pain.  6. Recommend gynecological followup.  7. The patient      may benefit from a thyroid hormone check.  8. We will see the patient      back in two months' time.      Zach   ZTS/MedQ  D:  03/27/2004 13:42:21  T:  03/27/2004 14:20:52  Job #:  [id number]

## 2010-08-04 NOTE — H&P (Signed)
NAME:  Mallory Medina, Mallory Medina NO.:  [id number]   MEDICAL RECORD NO.:  [id number]                   PATIENT TYPE:  IPS   LOCATION:  0505                                 FACILITY:  [facility]   PHYSICIAN:  Geoffery Lyons, M.D.                   DATE OF BIRTH:  Feb 15, 1966   DATE OF ADMISSION:  04/12/2003  DATE OF DISCHARGE:                         PSYCHIATRIC ADMISSION ASSESSMENT   IDENTIFYING INFORMATION:  This is a 45 year old separated white female who  is a voluntary admission.   HISTORY OF PRESENT ILLNESS:  This patient presented in the [facility]  Emergency Department after she slipped on the ice this week and re injured  her lower back.  While she was there she requested help getting off drugs  and alcohol.  She has a past history of cocaine abuse and reports that she  was clean for about 2 months after she was discharged from [facility] in September of 2004.  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  She reports that she is depressed and has been feeling guilty and  hopeless about her drug use.  She has no specific plans for suicide and  denies any homicidal thought.  She feels hopeless to control her drug use  and is hoping that after she leaves here that she can go through a longer  term rehab plan.  She reports that she has seen shadows in the corners of  her vision during times that she has been using cocaine, no auditory or  visual hallucinations at this time.  Her sleep has been broken at night,  appetite is generally poor with these symptoms for the past 2 weeks.   PAST PSYCHIATRIC HISTORY:  This is the patient's third or fourth admission  to [facility].  She does have a history of prior  suicide attempt by overdose, and currently she has been getting her  medications from her primary care physician and she is not under psychiatric  care.  The patient reports that she had been on Effexor in the past but  found it ineffective.  She has reported positive response to Prozac.   SOCIAL HISTORY:  The patient has been separated for the past 5 years from  her husband.  She has a 39 year old daughter who she is close to and who  lives with her mother.  The patient is unemployed.  No legal charges against  her at this time, but admits that she had been stealing and at one point had  stolen from her own mother.  She is currently homeless and has been living  on the streets.  FAMILY HISTORY:  Unclear.   ALCOHOL AND DRUG HISTORY:  See above.   PAST MEDICAL HISTORY:  The patient has been followed by Dr. Lurena Joiner at  [facility] and the patient has also been followed by [facility] and had been receiving steroid injections for her  ruptured lumbar disk.  The patient has a medical history significant for  asthma and she has a history of 2 prior back surgeries.   MEDICATIONS:  Neurontin 600 mg p.o. q.i.d., Symbyax 12/50 mg one daily,  Wellbutrin XL 150 mg p.o. q.a.m., trazodone 300 mg p.o. q.h.s.  The patient  has also undergone spinal injections at [facility], last date  unclear.  Also the patient is on Protonix 40 mg daily.   DRUG ALLERGIES:  SULFA, MORPHINE.   REVIEW OF SYSTEMS:  Reveals that the patient reports that she had a seizure  in which she fell out on the floor and was shaking.  This occurred about 2  weeks ago under the influence of both cocaine and alcohol.  She reports that  she did not lose bowel or bladder function at that time, did go to the  emergency room, was not formally diagnosed with a seizure but she feels that  she did have a seizure.  The patient reports tremulousness and anxiety over  the past day or two since  she has stopped drinking, feels this has been  alleviated by the Librium we have given her.  She is positive for cocaine  cravings and feels that they are very strong today and endorses risk of  sexually-transmitted disease, denies IV drug use.   POSITIVE PHYSICAL FINDINGS:  Please see the physical examination that was  done in the [facility] Emergency Department.  We have nothing new to add to  that today.  The patient's vital signs are within normal limits.   DIAGNOSTIC STUDIES:  Her CBC is within normal limits as is her metabolic  panel.  Her pregnancy test is negative.  Urine drug screen is positive for  cocaine and THC.  Her alcohol level was less than 8.   MENTAL STATUS EXAM:  This is a fully alert female who is overweight.  She is  fatigued looking and disheveled, with a depressed and blunted affect, but  she is cooperative and pleasant.  Speech is normal in pace, tone and amount.  Mood is anxious, depressed.  She feels guilty and hopeless about her drug  use, quite ashamed of herself for relapsing and ashamed of the life that she  is currently living.  No mood lability noted.  Thought process is logical  and coherent.  No evidence of psychosis, positive for suicidal ideation with  no clear plan on how she would commit suicide but rather feeling hopeless  about her life, with nothing to live for.  No homicidal ideation.  Cognitively she is intact and oriented x3.  Intelligence is within normal  limits.  Insight satisfactory, impulse control and judgment within normal  limits.   ADMISSION DIAGNOSIS:   AXIS I:  1. Depressive disorder not otherwise specified.  2. Polysubstance abuse.   AXIS II:  Deferred.   AXIS III:  Chronic low back pain.   AXIS IV:  Severe problems with homelessness.   AXIS V:  Current 30, past year 22.   INITIAL PLAN OF CARE:  Voluntarily admit the patient to treat her cocaine addiction and to alleviate her depression.  We have elected to  stop her   Wellbutrin because of her history of seizure and have placed her on Librium  25 mg p.o. q.6 hours p.r.n..  We have also placed her on Symmetrel 100 mg  p.o. b.i.d. for the cocaine cravings, and have asked the case manager to  check on resources for her to try to get her restarted and get into some  type of a safe home situation.  Meanwhile we have discussed the risks of  sexually-transmitted disease including HIV and hepatitis and we will get an  RPR and an HIV antibody test along with a hemoglobin A1C, fasting lipid  panel,  and a TSH.  We have discussed the plan or care with the patient and she is  currently in agreement.  We have discussed the risks and benefits of the  medicine and the patient voices her understanding.   ESTIMATED LENGTH OF STAY:  5 days.     Margaret A. Scott, N.P.                   Geoffery Lyons, M.D.    MAS/MEDQ  D:  04/13/2003  T:  04/13/2003  Job:  [id number]

## 2010-08-04 NOTE — Discharge Summary (Signed)
Mallory Medina, AUL NO.:  [id number]   MEDICAL RECORD NO.:  [id number]          PATIENT TYPE:  INP   LOCATION:  5018                         FACILITY:  [facility]   PHYSICIAN:  Nelda Severe, MD      DATE OF BIRTH:  July 16, 1965   DATE OF ADMISSION:  07/31/2005  DATE OF DISCHARGE:  08/01/2005                                 DISCHARGE SUMMARY   This woman was admitted for surgical management of back and leg pain.  It  was proposed to do a revision laminectomy at L5-S1 and L4-5, L5-S1 fusion.  The day of admission she was taken to the operating room.  Anesthesia was  induced, she was positioned and the incision partially made.  At this point  it was discovered that her endotracheal tube was malpositioned.  She was  taken off the table, placed on a gurney.  The tube was malpositioned.  X-  rays showed the possible presence of aspiration pneumonia.  Therefore, the  surgery procedure was aborted.  She was seen in the recovery room by the  pulmonology service who felt that she probably had atelectasis and not  aspiration.  She has been maintained in the hospital for observation  overnight.  She will be discharged this morning if her chest x-ray is clear  and it is pending.  Clinically she looks fine with the likelihood of  aspiration pneumonia being remote __________.   FINAL DIAGNOSES:  Lumbar spondylosis and stenosis status post previous  laminectomy, atelectasis left lung status post malposition of ET tube.   CONDITION ON DISCHARGE:  Stable.   DISCHARGE INSTRUCTIONS:  She will call the office to rebook her surgery.      Nelda Severe, MD  Electronically Signed     MT/MEDQ  D:  08/01/2005  T:  08/01/2005  Job:  [id number]

## 2010-08-04 NOTE — Procedures (Signed)
[name], Mallory Medina NO.:  [id number]   MEDICAL RECORD NO.:  [id number]          PATIENT TYPE:  REC   LOCATION:  TPC                          FACILITY:  [facility]   PHYSICIAN:  Erick Colace, M.D.DATE OF BIRTH:  10-31-1965   DATE OF PROCEDURE:  10/30/2004  DATE OF DISCHARGE:                                 OPERATIVE REPORT   MEDICAL RECORD NUMBER:  [id number].   PROCEDURE:  Caudal epidural steroid injection under fluoroscopic guidance.   INDICATIONS:  Post laminectomy syndrome with radicular symptoms at L5 and S2  dermatomes.   Prior S1 transforaminal injection not particularly helpful for patient's  pain. She does have bilateral lower extremity pain at the current time.   Informed consent was obtained after describing risks and benefits of the  procedure to the patient. These include bleeding, bruising, infection, loss  of bowel and bladder function, temporary or permanent paralysis, and death.  She elects to proceed.   The patient placed prone on fluoroscopy table, Betadine prep, sterile drape.  A 25-gauge 1-1/2-inch needle was used to anesthetize skin and subcu tissues  in the inferior presacral area on the dorsal side.   Then, a 22-gauge, 3-1/2-inch spinal needle was inserted under fluoroscopic  guidance into the sacral hiatus, and this was confirmed on both AP and  lateral imaging. Then, Omnipaque 180 under live fluoro demonstrated no  intravascular uptake and epidural spread, and then a solution containing 2  mL of 40 mg per mL Kenalog and 2 mL of 1% lidocaine and 6 mL of 0.9 normal  saline preservative free was injection. The patient tolerated the procedure  well. Pre-injection pain 9/10. Post-injection 7/10. If no significant  improvement with this, the last location for injection would likely be at L2-  3 interspace.      Erick Colace, M.D.  Electronically Signed     AEK/MEDQ  D:  10/30/2004 17:13:32  T:  10/31/2004 07:16:32  Job:   [id number]

## 2010-08-04 NOTE — Procedures (Signed)
NAME:  Mallory Medina, Mallory Medina                          ACCOUNT NO.:  [id number]   MEDICAL RECORD NO.:  [id number]                   PATIENT TYPE:  REC   LOCATION:  TPC                                  FACILITY:  [facility]   PHYSICIAN:  Erick Colace, M.D.           DATE OF BIRTH:  01/22/1966   DATE OF PROCEDURE:  05/20/2003  DATE OF DISCHARGE:                                 OPERATIVE REPORT   INDICATIONS FOR PROCEDURE:  The patient returns today for an L4-5  translaminar epidural steroid injection.  She has had a previous L2-3  translaminar epidural steroid injection for a history of right L3  radiculopathy.  This was improved following the injection.  She has pain  lower down now.   Pre-injection pain level 7 out of 10; refer to pre-procedure record.   DESCRIPTION OF PROCEDURE:  Informed consent was obtained after discussing  the risks and benefits of the procedure with the patient.  The risks include  bleeding, bruising, infection, loss of bowel and bladder dysfunction,  paralysis and loss of sensation in the lower extremities.  The patient  elects to proceed.  The patient was placed prone on the fluoroscopy table  and fluoroscopic images were obtained.  The L4-5 interspace was identified.  An 18 gauge curved spinal epidural needle was utilized.  Both AP and lateral  views were obtained to measure the appropriate depth and placement.  Loss of  resistance technique was used utilizing normal saline.  Omnipaque 180 times  1 mL demonstrated epidural flow.  Then a solution containing 2 mL of 40 mg  per mL Kenalog in 2 mL of 1% methylparaben free lidocaine were injected.  The patient tolerated the procedure well.  The procedure was done after  sterile Betadine prep and drape as well as 1 mL of subcutaneous lidocaine to  anesthetize the skin and subcutaneous tissues.                                                Erick Colace, M.D.    AEK/MEDQ  D:  05/20/2003 17:36:06  T:   05/20/2003 18:56:05  Job:  [id number]   cc:   [name], M.D.  [address]  [place]  [place] [address]  Fax: [phone]

## 2010-08-04 NOTE — Assessment & Plan Note (Signed)
MEDICAL RECORD NUMBER:  [id number]   HISTORY OF PRESENT ILLNESS:  Mrs. Legault is back regarding her low back pain.  She continues to have significant low back symptoms and pain and numbness  into the left leg.  She had three sets of injections by Dr. Wynn Banker which  include a caudal epidural on August 14, S1 transforaminal epidural injection  on July 17 which provided no relief.  She had a L3-4 left paramedian  translaminar epidural injection without relief either.  The patient rates  her pain as a 6-7/10.  Describes it as sharp, dull, aching, burning,  stabbing.  She has moved to a new job.  We are told that she will be sitting  and is less stressful, and she is hopeful this will be a benefit to her  symptoms.  She is still frustrated with her pain and feels that it  interferes with general activity and enjoyment of life on a severe level.  The pain does not abate during the day.  She sleeps fairly well.  Pain  increases with bending, sitting and standing for prolonged periods of time  and improves with rest.   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   REVIEW OF SYSTEMS:  The patient reports weight gain, depression, anxiety,  diarrhea.   PHYSICAL EXAMINATION:  VITAL SIGNS:  Blood pressure 117/68, pulse 97,  respirations 16, saturating 97% on room air.  GENERAL APPEARANCE:  The patient is pleasant, in no acute distress.  She  remains moderately obese.  Affect is bright and generally appropriate. Gait  is stable.  Coordination is fair.  Reflexes are 1+ in both lower  extremities.  Straight-leg testing was positive bilaterally.  She has  subjective numbness along the dorsum of the foot today, somewhat into the  lateral leg, although, this could be consistent.  Left leg was a bit weaker  with ankle dorsiflexion, +/- with plantar flexion today from the left side.  Left extension flexion of the knee and hip was 4+ to 5/5.  Right lower  extremity was 4+ to 5/5 throughout.  The patient tended to walk with some  favoring and antalgia to the left side, but minimally so today.   IMPRESSION:  Patient with significant lumbar spondylosis of the spine at L2-  S1.  The patient has annular disk tears at L2-3 and L4-5. She has severe  left neural foraminal narrowing and moderate right neural foraminal  narrowing at L5-S1 which may correlate her symptoms, particularly in the  left leg.  The patient has history of right hemilaminectomy at L5-S1.   PLAN:  1.  I have gone through multiple treatments and injection therapy, etc.  She      continues to have difficulties with her pain, particularly in the left      leg.  She would like another opinion regarding surgical intervention      along the lumbar spine.  We will send her to Dr. Lelon Perla for      evaluation.  2.  In the meantime, we will increase Kadian to 50 mg b.i.d.  Continue Norco      10/25 for breakthrough pain.  3.  Will send her for dietary counseling for weight loss.  4.  I will see her back in about one months time.      Ranelle Oyster, M.D.  Electronically Signed    ZTS/MedQ  D:  01/09/2005 17:24:49  T:  01/10/2005 91:47:82  Job #:  [id number]   cc:   Henry A. Pool, M.D.  Fax: [phone]

## 2010-08-04 NOTE — Op Note (Signed)
Mallory Medina, LEMBO NO.:  [id number]   MEDICAL RECORD NO.:  [id number]          PATIENT TYPE:  INP   LOCATION:  5035                         FACILITY:  [facility]   PHYSICIAN:  Nelda Severe, MD      DATE OF BIRTH:  07-09-1965   DATE OF PROCEDURE:  08/21/2005  DATE OF DISCHARGE:                                 OPERATIVE REPORT   SURGEON:  Dr. Casimiro Needle.   ASSISTANT:  OR staff.   PREOPERATIVE DIAGNOSIS:  Lumbar spondylosis and stenosis.  Status post  laminectomy.   POSTOPERATIVE DIAGNOSIS:  Lumbar spondylosis and stenosis.  Status post  laminectomy.   PROCEDURE:  Left L4-5, L5-S1 lamina foraminotomies and facette joint  resection; L4-5, L5-S1 posterolateral fusion; insertion of pedicle screws at  L4-5, L5-S1 bilaterally; local graft harvest; aspiration of bone marrow from  left iliac crest for add mixture with VITOSS (tricalcium phosphate) morsels.   OPERATIVE NOTE:  The patient was placed under general endotracheal  anesthesia.  A Foley catheter was placed in the bladder.  She received  intravenous vancomycin prophylactically.  Bilateral sequential compression  devices were attached to her lower extremities.   The patient was positioned prone on the [place] frame.  Care was taken to  position the upper extremities to avoid hyperflexion and abduction of the  shoulders and also to avoid hyperflexion of the elbows.  The upper  extremities were padded from axilla to hands with foam.  There was no  pressure on the cubital tunnels.  The hips and knees were gently flexed and  supported on pillows.   The lumbar area was prepped using DuraPrep and draped in square fashion.  The drapes were secured with Ioban.   The skin was scored in the location had been scored at the previous aborted  surgery.  Subcutaneous tissue was injected a mixture of 0.25% plain Marcaine  and 1% plain lidocaine.  The incision was deepened and the previous scar  excised in elliptical fashion.   Dissection was carried down into the fascial  layer.  Cutting current and Cobb elevators used to mobilize the paraspinal  muscles bilaterally from the transverse process of L4 superiorly to about S2  distally.  Transverse processes at L4, L5 and ala sacrum were exposed  bilaterally.   Next we made pedicle holes at S1, L5-L4, first on the right and on the left.  This was done in the same fashion at each level.  At the sacral levels the  base of the superior tracheal process was removed with a Leksell rongeur to  reveal the posterior pedicle.  Pedicle was perforated with an awl and a  pedicle probe and then a 3.2 mm drill bit used to make a hole through the  pedicle in the vertebral body.  At the lumbar level the junction of the  superior articular process and transverse process was identified and a piece  of the superior articular process removed with Leksell, the posterior  pedicle identified, perforated with an awl and then hole created using a  pedicle probe and 3.5 drill bit.  In each  instance the hole was carefully  palpated with a ball-tip probe to make sure it was circumferentially intact  and then sounded for depth and the depths recorded.  In each hole, FloSeal  was injected and then a radiopaque marker placed.  Cross-table lateral  radiograph showed satisfactory position radiopaque markers.   Next we harvested local bone graft from the posterior elements using a small  acetabular reamer.  This thinned down the lamina at L4 and L5 as well as the  inferior articular processes of either level.  Because the patient is having  left leg pain and not right, we did not perform revision laminectomy on the  right side.  The left side had never had a laminectomy.  We performed lamina  foraminotomies and inferior articular process excisions at L4-5, L5-S1.  Both instances some of the superior articular process was removed and  lateral decompression carried out until a ball-tipped nerve hook  was  admitted into the neural foramen without any obstruction.  The lateral  recess was well decompressed at both levels.   Next we mixed 30 mL of the VITOSS morsels with approximately 25 mL of bone  marrow aspirate from the left posterior iliac crest with the bone graft we  had harvested.   We then decorticated the ala sacrum and the transverse processes at L5-L4  and lateral aspect of the superior articular process at L4 on the right  side.  About half of the mixture of VITOSS, bone marrow aspirate and locally  harvested graft was then packed in between the transverse process of L4 and  superior lateral aspect of the superior articular process of L4 and the ala  sacrum distally.  We then inserted the right-sided screws, these were  Iscolap screws.  A 7 mm screw was used at the S1 and 6 mm screws and L5-L4.  The depths were based on the depth measurements and checked just prior to  putting the screw in.  Each hole was pre tapped.  Each screw was then tested  by stimulating it and determining the levels of stimulation required to  stimulate the distal recording EMGs.  Each instance the levels were well  above the danger level.   We then did exactly the same on the left side, first creating the fusion bed  from the ala of sacrum to the lateral aspect of the superior articular  process of L4, then placing most of the remaining graft and then placing the  screws.  Screws were then stimulated and all of the levels of distal  stimulation were in the safe zone.   Actually prior to placing the screws on the left side we had provisionally  attached the pre contoured 50 mm titanium rod to the screws on the right  side.  We then preliminarily attached a 50 mm rod on the left, but this  proved too short, and had to be removed as did the L4 screw because we had  tightened the set screw down and therefore the risked weakening the coupling mechanism.  Therefore the L4 screw was removed and a new  screw placed, same  size.  It was again stimulated and the distal recording EMG stimulated at  safe level.   The 60 mm rod was then placed and provisionally coupled.  Prior to placing  the rod, cross-table lateral radiograph was taken which showed satisfactory  position of the screws.  A 60 mm rod was then provisionally attached and  then we torqued  all of the couplings.  The Tangs of each saddle were then  broken off and placed on Mayo stand so they could be counted and we knew  that none had been retained in the field.   There was a small amount of remaining graft which was then placed  posterolaterally on the right side.   Next we closed the wound in layers.  #1 Vicryl in continuous fashion was  used for the fascia.  A one-eighth inch Hemovac drain was placed in the  subcutaneous layer and brought out through the skin to the right.  Drain was  secured with a 2-0 nylon suture in basket weave fashion.  The subcutaneous  layer was closed using combination of interrupted and continuous 2-0 Vicryl  suture.  The skin was closed using subcuticular 3-0 Vicryl suture undyed in  continuous fashion.  The skin edges were reinforced with Steri-Strips.  Antibiotic ointment dressing applied and secured with tape.   ESTIMATED BLOOD LOSS:  350 mL.  There no intraoperative complications.  The  patient was placed in her bed and brought to the recovery room.  At the  point of dictation, the patient has not had neurologic examination by me.      Nelda Severe, MD  Electronically Signed     MT/MEDQ  D:  08/21/2005  T:  08/22/2005  Job:  [id number]

## 2010-08-04 NOTE — Procedures (Signed)
Mallory Medina, Mallory Medina NO.:  [id number]   MEDICAL RECORD NO.:  [id number]          PATIENT TYPE:  REC   LOCATION:  TPC                          FACILITY:  [facility]   PHYSICIAN:  Erick Colace, M.D.DATE OF BIRTH:  1966/01/04   DATE OF PROCEDURE:  12/11/2004  DATE OF DISCHARGE:                                 OPERATIVE REPORT   MEDICAL RECORD NUMBER:  [id number].   PROCEDURE:  L3-4 left paramedian translaminar epidural steroid injection  under fluoroscopic guidance. Ordering physician Dr. Riley Kill.   REASON FOR INJECTION:  Post laminectomy syndrome, lumbar radiculopathy, only  partly responsive to narcotic analgesics as well as adjuvant therapy.   Prior injections tried:  Caudal epidural October 30, 2004 with no significant  relief and S1 transforaminal epidural steroid injections performed prior to  this on October 02, 2004 without significant relief.   Informed consent was obtained after describing risks and benefits of the  procedure to the patient. These include bleeding, bruising, infection, loss  of bowel and bladder function, temporary or permanent paralysis, and she  elects to proceed.   The patient placed prone on fluoroscopy table, Betadine prep, sterile drape.  A 25-gauge 1-1/2-inch needle was used to anesthetize skin and subcu tissues.  A 18-gauge 6-inch Tuohy needle was inserted under fluoroscopic guidance into  the left paramedian L4-5 interspace, and loss-of-resistance technique was  utilized demonstrating good loss of resistance to saline/air combination  when needle advanced carefully under lateral fluoroscopic imaging. Omnipaque  180 demonstrated good epidural spread x2 cc under live fluoro. Then a test  solution 1 cc of 1% lidocaine was injected. No lumbar extremity numbness or  paresthesias was elicited, and then a solution of 40 mg/cc Depo-Medrol x2  cc, then lidocaine methylparaben-free 1% x2 cc were injected. The patient  tolerated the  procedure well. Preinjection pain level 9/10. Postinjection  pain level 5/10.      Erick Colace, M.D.  Electronically Signed     AEK/MEDQ  D:  12/11/2004 12:38:43  T:  12/12/2004 11:52:26  Job:  [id number]

## 2010-08-04 NOTE — Assessment & Plan Note (Signed)
DATE OF VISIT:  May 17, 2004.   MEDICAL RECORD NUMBER:  [id number].   Mallory Medina is back regarding her low back pain and post laminectomy syndrome.  She has had some continued low back pain and some pain into the left leg.  It really has been exacerbated by her workload.  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  She is working third shift, which means she  is overnight.  She is standing about 12 hours at a time.  She is not able to  sit down.  She is folding pamphlets and packaging.  The back ends up being  very sore by the end of the day.  Her pain will range from an 8-10  generally.  Trileptal has helped to a certain extent with the left leg.  She  continues to have psychosocial issues that are stressful.   SOCIAL HISTORY:  The patient continues to smoke.  She lives with her  parents.  Her mother has some health issues of late.   REVIEW OF SYSTEMS:  The patient reports some wheezing, coughing, weakness,  numbness and problems with depression, anxiety, sleep, reflux and appetite.   PHYSICAL EXAMINATION:  The blood pressure is 119/67, the pulse is 74 and the  respiratory rate is 20.  She is saturating 94% on room air.  The patient  walks with a slightly antalgic gait favoring the left side, but this is  minimal.  The back and pelvic posture are fair.  There is some pain along  both PSIS areas in the lower lumbar paraspinals.  Range of motion is stable  at 50-60 degrees of flexion and extension and 10 degrees with some pain.  She has less diffuse body pain I would say today.  The knees are stable.  Motor and sensory exams are both within normal limits.  The heart is regular  rate and rhythm.  The lungs are clear.  Abdomen soft and nontender.   IMPRESSION:  1.  Post laminectomy syndrome.  2.  Lumbar radiculopathy.  3.  Manic depression.  4.  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   PLAN:  1.  The patient has not been able to use Lidoderm secondary to Medicaid    restrictions.  I would like to resume the Lidoderm patches and she will      use these at work.  2.  Continue Kadian 30 mg b.i.d.  This was refilled the other day.  3.  I refilled Vicodin 5/500 mg one q.6h. p.r.n. #60.  4.  Continue Trileptal 300 mg t.i.d.  5.  Recommended ongoing weight loss and aerobic activity as able.  6.  I gave the patient a prescription in order to take breaks and sit on a      stool during work if possible.  The prolonged standing is certainly not      beneficial for her low back.  7.  I will see her back in three months' time.      ZTS/MedQ  D:  05/17/2004 09:46:34  T:  05/17/2004 10:12:51  Job #:  [id number]

## 2010-08-04 NOTE — Discharge Summary (Signed)
Mallory Medina, Mallory Medina NO.:  [id number]   MEDICAL RECORD NO.:  [id number]                   PATIENT TYPE:  IPS   LOCATION:  0503                                 FACILITY:  [facility]   PHYSICIAN:  Geoffery Lyons, M.D.                   DATE OF BIRTH:  1965-05-21   DATE OF ADMISSION:  08/05/2002  DATE OF DISCHARGE:  08/09/2002                                 DISCHARGE SUMMARY   CHIEF COMPLAINT AND PRESENT ILLNESS:  This was the first admission to [facility] for this 45 year old single white female voluntarily  admitted.  History of intentional overdose on 400 tablets of Tylenol.  The  patient said she had the medication at home.  Did not realize how dangerous  it was.  No one was there.  She left a note for her boyfriend, her mother  and her daughter.  She was wanting to die.  Stressors include that she stole  money from her mother and her employer.  She relapsed on crack cocaine about  a month prior to this admission.  She felt very guilty over this situation.  Upon admission, she stated that she wanted to live.   PAST PSYCHIATRIC HISTORY:  First time at [facility].  No other  psychiatric treatment.  In rehab eight years ago for cocaine.  Longest  history of being clean was four years.   ALCOHOL/DRUG HISTORY:  Relapsed on crack cocaine.  Occasional use of  alcohol.   PAST MEDICAL HISTORY:  Back problems, two back surgeries and asthma.   MEDICATIONS:  Prozac 40 mg daily (on for six months).   PHYSICAL EXAMINATION:  Performed.   LABORATORY DATA:  Tylenol level, on admission, was 837.4.  SGOT was  __________  down to 38 on Aug 06, 2002.  Urine pregnancy test was negative.  Head CT scan was negative.   MENTAL STATUS EXAM:  Alert, oriented, cooperative female with good eye  contact, casually dressed.  Speech was clear.  Mood is depressed.  Affect is  flat.  Agreeable.  However, receptive to medication and treatment plan.  Thought  processes are coherent.  No evidence of psychosis.  No auditory or  visual hallucinations.  Cognition well-preserved.   ADMISSION DIAGNOSES:   AXIS I:  1. Major depression, recurrent.  2. Cocaine dependence.   AXIS II:  No diagnosis.   AXIS III:  1. Status post acetaminophen overdose.  2. Back pain.  3. Asthma.   AXIS IV:  Moderate.   AXIS V:  Global Assessment of Functioning upon admission 30; highest Global  Assessment of Functioning in the last year 65.   LABORATORY DATA:  CBC with hemoglobin 11.6.  Blood chemistries with glucose  112.  SGOT 31, SGPT 138.   HOSPITAL COURSE:  She was admitted and started intensive individual and  group psychotherapy.  She was given  Ambien for sleep, Ativan as needed for  anxiety, Protonix for her stomach, Prozac 40 mg per day, albuterol  nebulizer, oxycodone 5 mg every six hours as needed.  She was switched to  Symmetrel 100 mg twice a day, Neurontin 100 mg three times a day, Wellbutrin  XL 150 mg in the morning.  OxyContin was discontinued.  She was given an  albuterol inhaler.  Neurontin was increased to 200 mg twice a day and 300 mg  at bedtime.  She did admit that she relapsed on cocaine and she felt so  upset and ashamed that she wanted to die.  But she persistently denied that  she was still feeling suicidal.  As the hospitalization progressed, she  started feeling better.  Family session with her daughter, parents, brother  and boyfriend.  Led to being able to talk, clarify things.  They were  supportive of her.  She was tearful during the interview, stating that she  was sorry, feeling very guilty.  On Aug 09, 2002, she was in full contact  with reality.  Mood was euthymic.  Affect was bright, broad.  No suicidal  ideation.  No homicidal ideation.  A lot of support from the family.  Willing and motivated to pursue further treatment, so discharge was  considered and granted.   DISCHARGE DIAGNOSES:   AXIS I:  1. Major depression,  single episode.  2. Cocaine dependence.   AXIS II:  No diagnosis.   AXIS III:  1. Asthma.  2. Status post Tylenol overdose.  3. Chronic back pain.   AXIS IV:  Moderate.   AXIS V:  Global Assessment of Functioning upon discharge 60.   DISCHARGE MEDICATIONS:  1. Protonix 40 mg per day.  2. Prozac 40 mg daily.  3. Wellbutrin XL 150 mg daily.  4. Neurontin 100 mg, 2 twice a day.  5. Neurontin 300 mg at night.  6.     Albuterol inhaler.  7. Ambien 10 mg at bedtime for sleep.   FOLLOW UP:  [facility].                                               Geoffery Lyons, M.D.    IL/MEDQ  D:  09/09/2002  T:  09/10/2002  Job:  [id number]

## 2010-08-04 NOTE — Discharge Summary (Signed)
NAME:  Mallory Medina, Mallory Medina NO.:  [id number]   MEDICAL RECORD NO.:  [id number]                   PATIENT TYPE:  IPS   LOCATION:  0505                                 FACILITY:  [facility]   PHYSICIAN:  Geoffery Lyons, M.D.                   DATE OF BIRTH:  1965/04/09   DATE OF ADMISSION:  04/12/2003  DATE OF DISCHARGE:  04/21/2003                                 DISCHARGE SUMMARY   CHIEF COMPLAINT AND PRESENT ILLNESS:  This was the third or fourth admission  to [facility] for this 45 year old separated white  female, voluntarily admitted.  She presented to the emergency room at [facility] emergency department.  She slipped on the ice and injured her lower  back.  She requests help getting off drugs and alcohol.  History of cocaine,  clean for 2 months, relapsed on cocaine around mid November.  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  Her drinking escalated  in fashion up to two 4-oz beers on most days.  Depressed.  Guilty.  Hopeless.  No specific plan for suicide.  Very hopeless.  Unable to control  her drug use.  Would like to go to long-term rehab.   PAST PSYCHIATRIC HISTORY:  This is the third or fourth time to [facility].  She had been on Effexor, which was ineffective, was  __________ response to Prozac.   PAST MEDICAL HISTORY:  1. Steroid injection for a ruptured lumbar disk.  2. Asthma.   MEDICATIONS:  1. Neurontin 600 four times a day.  2. Symbyax 12/50 one daily.  3. Wellbutrin XL 150 mg in the morning.  4. Trazodone 300 at bedtime.   PHYSICAL EXAMINATION:  Performed and failed show any acute findings.   LABORATORY DATA:  Hemoglobin A1c 5.8.  Triglycerides 236.  HIV non-reactive.  TSH within normal limits.  RPR non-reactive.   HOSPITAL COURSE:  She was admitted and started in intensive individual and  group psychotherapy.  She was on Librium as needed for anxiety, Symmetrel  100 twice a day.  She was given Ultram every 8 hour as needed for pain,  Neurontin 600 four times a day, Protonix 40 mg in the morning, Symbyax 12/25  in the morning, trazodone 100 at bedtime for sleep.  Trazodone was increased  to 150.  She was given some Robaxin 7.5 every 8 hours.  She was given  naproxen 500 twice a day for the pain.  She was given a short term of  Percocet.  Trazodone was increased to 300 for sleep.  She admitted that she  had relapsed on cocaine and marijuana.  There is persistent pain.  Felt no  support.  She was homeless.  Broke up with her boyfriend again.  Claimed  that that was also an issue.  She was somatically focused,  wanting to have  some control of her pain.  Easily upset because she had dealt with the shame  and guilt for having relapsed.  After trazodone was increased, she started  sleeping better.  Objectively her mood was better.  Her affect was better.  She seemed somatically focused.   On April 18, 2003 she felt something snap in her back.  She was sent to  the emergency room where she was checked, and there were no acute findings.  We started working on discharge.  When she was told that she going to be  discharged, she said she was suicidal, anxious, depression, upset, hopeless,  helpless, alone, afraid.  There was a bed in the half-way house, which was  very encouraging.  To that, she had to be off her benzodiazepines and her  opiates, and she was willing to do that.  We went ahead and fully detoxed.   On April 21, 2003, she was in full contact with reality.  No suicide  ideas.  No homicidal ideas.  No hallucinations.  No delusional.  She was  dealing with pain, but she was willing to do without the opiates in order to  secure a bed.  Upon discharge, no suicidal ideas, no homicidal ideas,  motivated.   DISCHARGE DIAGNOSIS:   AXIS I:  1. Depressive disorder, not otherwise specified.  2. Polysubstance abuse.   AXIS II:  No diagnosis.    AXIS III:  Chronic back pain.   AXIS IV:  Moderate.   AXIS V:  Global assessment of functioning on discharge:  50.   DISCHARGE MEDICATIONS:  1. Symmetrel 100 twice a day.  2. Neurontin 600 four times a day.  3. Protonix 40 mg daily.  4. Symbyax 12/50 one at bedtime.  5. Naproxen 500 twice a day.  6. Trazodone 300 at bedtime for sleep.   FOLLOW UP:  [facility], [place] and the half-  way house.                                               Geoffery Lyons, M.D.    IL/MEDQ  D:  05/11/2003  T:  05/12/2003  Job:  [id number]

## 2010-08-04 NOTE — Discharge Summary (Signed)
Mallory Medina, Mallory Medina                            ACCOUNT NO.:  [id number]   MEDICAL RECORD NO.:  [id number]                   PATIENT TYPE:  INP   LOCATION:  4712                                 FACILITY:  [facility]   PHYSICIAN:  Deirdre Peer. Polite, M.D.              DATE OF BIRTH:  1965/06/27   DATE OF ADMISSION:  07/31/2002  DATE OF DISCHARGE:  08/05/2002                                 DISCHARGE SUMMARY   DISCHARGE DIAGNOSES:  1. Tylenol overdose with hepatotoxicity, resolving.  2. Major depression.  3. Thrombocytopenia, no acute bleeding.   DISCHARGE MEDICATIONS:  1. Protonix 40 mg daily.  2. Prozac 40 mg daily.   CONSULTS:  1. Dr. Antonietta Breach of psychiatry.  2. Dr. Marcelyn Bruins of Critical Care Medicine.   PROCEDURES AND STUDIES:  1. Chest x-ray, Aug 02, 2002:  No evidence of acute cardiopulmonary disease,     removal of the nasogastric tube.  2. CT of the head without contrast, Jul 31, 2002:  No evidence of     intracranial mass or hemorrhage, no skull fracture or foreign body.     Paranasal sinuses appear normal.  The intracranial vessels appear to be     somewhat more dense than usual, suggesting possibility of dehydration.  3. Chest x-ray, Aug 01, 2002:  No active disease.  4. Twelve-lead electrocardiogram:  Normal sinus rhythm.   DISCHARGE LABORATORY DATA:  WBC of 4, RBC 3.18, hemoglobin 10.5, hematocrit  29.9, platelets 135,000.  PT 13.4, INR 1.0, PTT 29.  Sodium 140, potassium  3.4, chloride 104, CO2 31, glucose 118, BUN 4, creatinine 0.6, SGOT 42, SGPT  184, total protein 5.5, albumin 3.   DISPOSITION:  The patient is being discharged to [facility].   CONDITION ON DISCHARGE:  Stable.   HISTORY OF PRESENT ILLNESS:  This is a 45 year old female with a history of  drug abuse and depression, who presented to [facility] after being found  unresponsive on a cold tile floor by a family member.  The patient recently  has been under a lot of stress  and depressed.  Family members stated they  are concerned about cocaine abuse.  The patient reportedly took some Tylenol  as well last p.m.  Upon evaluation in the emergency room, the patient was  unresponsive to painful stimuli with occasional jerky movements.  The  patient had been given Narcan by EMS en route to the hospital.  Initial CT  of the head was negative for acute process.  Drug screen was positive for  cocaine.  Tylenol level was high at 837.4.  The patient was admitted for  further evaluation and treatment.   HOSPITAL COURSE:  #1 - MULTI-SUBSTANCE/TYLENOL OVERDOSE WITH HEPATOTOXICITY:  The patient was admitted.  Consult was made for Critical Care Medicine for  possible intubation for airway protection.  The patient was seen by Dr.  Marcelyn Bruins.  Upon examination by Dr. Shelle Iron, the patient was starting to  wake up and saw no need for emergent airway projection at that point.  Mucomyst was started for her Tylenol overdose.  LFTs and coagulations were  obtained.  Her LFTs and her INR did become elevated.  The patient was  monitored closely, as she was on suicide precautions with a sitter at  bedtime and all times.  A psychiatric evaluation was obtained; the patient  was seen by Dr. Jeanie Sewer with recommendation for inpatient dual treatment.  At discharge, her LFTs are resolving; her INR is normal at 1.0.  She has no  signs or symptoms of acute bleeding.   #2 - MAJOR DEPRESSION:  Major depression was addressed by psychiatry.  Her  outpatient medication of Prozac was continued during her hospitalization.   #3 - THROMBOCYTOPENIA:  No evidence of bleeding at this time.   DISCHARGE INSTRUCTIONS AND FOLLOWUP:  The patient needs LFTs in two to three  days.     Stephanie Swaziland, NP                      Deirdre Peer. Polite, M.D.    SJ/MEDQ  D:  08/05/2002  T:  08/06/2002  Job:  [id number]   cc:   Marcelyn Bruins, M.D. [facility]   Antonietta Breach, M.D.  [address]   [place], [place] [address]  Fax: [phone]   Elana Alm. Nicholos Johns, M.D.  [address]  [place]  [place] [address]  Fax: [phone]

## 2010-08-04 NOTE — Consult Note (Signed)
Mallory Medina, AHN NO.:  [id number]   MEDICAL RECORD NO.:  [id number]           PATIENT TYPE:   LOCATION:                               FACILITY:  [facility]   PHYSICIAN:  Michelene Gardener, MD         DATE OF BIRTH:   DATE OF CONSULTATION:  08/22/2005  DATE OF DISCHARGE:                                   CONSULTATION   PRIMARY CARE PHYSICIAN:  Elana Alm. Nicholos Johns, M.D.   REFERRING PHYSICIAN:  Nelda Severe, M.D.   REASON FOR CONSULTATION:  Abnormal EKG.   HISTORY OF PRESENT ILLNESS:  This is a 45 year old Caucasian female, with  past medical history of multiple problems, admitted to the hospital for  evaluation of back pain and left lower extremity pain.  The patient had  laminectomy at L5-S1 followed by two level fusion at L4-5 and L5-S1.  I was  called to consult on her because of chest pain and change in her EKG today.  When I spoke to the patient today she denied having any chest pain at all.  She stated that there is no chest pain today and there was no chest pain  yesterday. She denied any shortness of breath.  There is no vomiting, there  is no palpitations, and no syncope.  Her EKG was showing sinus tachycardia  with T wave inversion in the inferior leads.   PAST MEDICAL HISTORY:  1.  Back problem that started in early 1990's and she had two operations      done in 1990 and 1992.  2.  Bipolar disorder.  3.  GERD.   MEDICATIONS:  1.  Depakote.  2.  Lamictal.  3.  Protonix.  4.  Dulcolax.  5.  Zinc.   PAST SURGICAL HISTORY:  Back procedures done in 1990 and 1992 and  laminectomy on this admission.   SOCIAL HISTORY:  The patient is divorced and lives with her parents and one  daughter, age 49 years old.  She has been unable to work on a regular basis  for over a year.  She has worked in that regard intermittently.  She used to  smoke, but she quit around 3 months ago.  Before that, she used to smoke 1/2  packs of cigarettes a day for about 25 years.   She drinks alcohol  occasionally.  She denies recreational drugs.   FAMILY HISTORY:  No diabetes, no hypertension, and no coronary artery  disease.   REVIEW OF SYSTEMS:  Positive for some back pain and left shoulder pain.  CONSTITUTIONAL:  There is no fever, no fatigability, and no weight changes.  EYES:  No double vision, no pain, no weakness.  ENT:  No ear pain, no  hearing loss, no epistaxis.  No sinus pain or difficulty swelling.  RESPIRATORY:  No cough, no wheezes, no hemoptysis.  CARDIOVASCULAR:  NO  chest pain, no orthopnea, no edema, and no palpitations.  GASTROINTESTINAL:  No nausea, no vomiting, no diarrhea, and no abdominal pain.  No  constipation.  GENITOURINARY:  No dysuria, no hematuria, and  no frequency.  ENDOCRINE:  No polyuria, no nocturia, and no sweating.  HEMATOLOGY:  No  bruises and no bleeding, acne, no rash and no lesions.  NEUROLOGY:  No  numbness, no weakness, no dysphagia and no tremors and no headache.  The  rest of the systems were reviewed and they were negative.   PHYSICAL EXAMINATION:  VITAL SIGNS:  Temperature is 98.2, pulse 100,  respiratory rate 20, and blood pressure 100/58.  Oxygen saturation is 95% on  2 liters.  GENERAL:  This is a young, Caucasian female who lying down in bed in no  acute distress at the present time.  HEENT:  Conjunctivae are normal.  There is no pallor and no erythema.  Pupils equal, round, and reactive to light and accommodation.  There is no  ptosis.  Hearing is intact.  There is no ear discharge or infection.  There  is no nose discharge, infection, or bleeding.  Oral mucosa is moist and  there is no pharyngeal erythema.  NECK:  Supple.  There is no JVD, no carotid bruits, no lymphadenopathy, no  thyroid enlargement or thyroid tenderness.  HEART:  S1 and S2 are heard.  There is no additional heart sound.  There are  no murmurs, no gallops, and no thrills.  RESPIRATORY:  The patient is breathing between 18 to 20.  There is  no use of  accessory muscles.  No intercostal retractions.  No murmurs, no rales, no  rhonchi, and no wheezes.  ABDOMEN:  Soft and nondistended.  No tenderness.  No hepatosplenomegaly.  Bowel sounds are normal.  Umbilicus is central.  EXTREMITIES:  No edema.  No varicose veins.  SKIN:  No rash and no erythema.  Skin is warm to touch.  NEUROLOGY:  Cranial nerves II-XII grossly intact. No sensory deficits.   LABORATORY DATA:  WBC 10.6, hemoglobin 11.0, hematocrit 31.5, MCV 97,  platelet count is 149.  Sodium 139, potassium 4.3, chloride 98, bicarb 34,  glucose 219, BUN 4, creatinine 0.9, calcium 8.5.  Creatinine kinase total is  718 and then 706.  CK-MB is 15.3 and then 9.6.  Troponin is 0.05 and then  0.03.   EKG; initial one done at 2:47 a.m. showed sinus tachycardia at 130 beats per  minute.  There is ST depression in the inferior leads including lead 2, 3,  and aVF. There were also some T wave inversions in leads V3 through V6.  Repeat EKG at 9:20 a.m. is showing sinus tachycardia at 101 beats per minute  with same changes with ST depressions which are less pronounced in inferior  leads 2, 3, aVF, and her lateral leads V3, V4, V5, and V6.   IMPRESSION:  1.  Abnormal EKG.  The patient's EKG was showing sinus tachycardia and      normally with sinus tachycardia its compensatory mechanism which is      secondary to dehydration or pain and in this patient's case I am      suspecting her tachycardia is secondary to her pain.  This was transient      tachycardia which resolved.  When I saw the patient she is running heart      rate between 90 to 100.  Normally the patient developed sinus      tachycardia, I would just keep it between 120 to 130 because it will be      compensatory and then this would be corrected if the patient went more  than 140 or if she developed high blood pressure or low blood pressure.     The inferior leads are showing some ST depression as well as the       anterolateral leads.  The two EKG's that were done are almost the same,      although, the ST depressions are more pronounced in the first EKG.  The      patient denies any chest pain and denies any shortness of breath.  I      will get three sets of troponins on her.  She has already had two sets      which are negative and we checked the third one and I would repeat her      EKG at that time as well to compare and see if there are any changes.      No need for medications at the present time, I would just start her on      sublingual nitroglycerin if needed.  I would also get echocardiogram to      assess her heart, her left ventricular function, and to see if there are      any wall motion abnormalities.  2.  Rhabdomyolysis.  Increased total CPK is secondary to rhabdomyolysis      rather than cardiac source and this would indicate that there is damage      to her muscular source and not the cardiac source because the troponins      remain in the normal range.  So the patient only needs IV fluids for      this to be corrected.  The patient is already receiving IV fluids. We      will followtotal CPK.  3.  Hyperglycemia.  I will get her fasting glucose level.  4.  Hypertension.  We will continue her current medications.  5.  Bipolar disorder.  Again we will continue her current medications.   Total assessment time is 60 minutes.      Michelene Gardener, MD  Electronically Signed     NAE/MEDQ  D:  08/22/2005  T:  08/23/2005  Job:  [id number]   cc:   Elana Alm. Nicholos Johns, M.D.  Fax: [phone]   Nelda Severe, MD  Fax: [phone]

## 2010-08-04 NOTE — Assessment & Plan Note (Signed)
MEDICAL RECORD NUMBER:  [id number].   Sacred is back regarding her low back pain and post laminectomy syndrome.  She has been fairly stable with her pain up until recently. She began a new  job a few weeks ago where she is walking two miles to and from work. She  began to develop increased left hip and leg pain radiating from the low  back. She also has some symptoms on the right but not to the same extent.  Instead of improving, these seem to be getting worse. She had to quit her  job and move back up to [place] area to help with her exhusband who  became sick. She also is assisting with the care of the children. The  patient rates her pain at a 9/10, describes it as sharp and tingling. It is  not worse any particular time of the day. Interferes with general activity,  enjoyment of life on a severe level. Pain seems to be worse with sitting or  bending. Also seems to worsen with walking. She has been sleeping on the  couch, and this seems to tie her back up in knots.   REVIEW OF SYSTEMS:  The patient reports numbness, tingling, depression, some  anxiety. She has had no suicidal thoughts. She denies any constitutional,  GU, GI, or cardiorespiratory problems.   SOCIAL HISTORY:  The patient is living with her exhusband with details  above.   PHYSICAL EXAMINATION:  VITAL SIGNS:  Blood pressure 140/80, pulse 76,  respiratory rate 16. She is saturating 97% on room air.  GENERAL:  The patient favors the left side today once again. She walks with  a flexed posture. The pelvis was also tilted forward. She continues to have  pain in both PSIS areas and lumbar paraspinals and associated facets. Range  of motion is about 30 degrees before pain sets in. Extension was 10 to 15  degrees with pain. She also had significant pain with rotation, particularly  of the left today. Straight leg testing was positive. Patrick's test was  equivocal. The patient had mild tenderness over both greater  trochanter  areas. Reflexes were 1+ to 2+ and nonfocal. Sensory exam was grossly intact.  The muscle exam was stable at 5/5 throughout.   ASSESSMENT:  1.  Post laminectomy syndrome.  2.  Lumbar radiculopathy.  3.  Manic depression.  4.  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   PLAN:  1.  Will send the patient for a MRI of the lumbar spine to rule out      worsening lumbar radiculopathy. She may be a candidate for epidural      injections.  2.  Will increase her breakthrough pain to Norco 10/325 one q.4h. p.r.n.      #90.  3.  Continue Kadian 30 mg b.i.d.  4.  I provided her with a Sterapred dose pack. Will see if this helps some      of her inflammation and symptoms.  5.  She will continue with the Trileptal 300 mg t.i.d.  6.  She needs to work on weight loss as we talked about before.  7.  We will see the patient back in one month's time if not sooner.     ZTS/MedQ  D:  08/15/2004 16:26:25  T:  08/16/2004 07:39:00  Job #:  [id number]

## 2010-08-04 NOTE — Discharge Summary (Signed)
NAME:  Mallory Medina, Mallory Medina NO.:  [id number]   MEDICAL RECORD NO.:  [id number]                   PATIENT TYPE:  IPS   LOCATION:  0301                                 [facility]ILITY[facility]  [facility]   PHYSICIAN:  Jeanice Lim, M.D.              DATE OF BIRTH:  Aug 11, 1965   DATE OF ADMISSION:  07/16/2003  DATE OF DISCHARGE:  07/25/2003                                 DISCHARGE SUMMARY   IDENTIFYING DATA:  This is a 45 year old Caucasian separated female,  relapsed on Tuesday with crack cocaine, suffered a seizure, found  unresponsive late afternoon, brought to the emergency room.  The patient  sees the relapse result of pressure of multiple stressors including a  suicide attempt by her daughter who is 69 years old.  Father had a heart  attack.  Mother is aware of the relapse and kicked her out of her home and  once again she is homeless.  Urine drug screen positive for cocaine,  opiates, and benzodiazepines as well as cannabis, negative for tricyclics,  no acetaminophen.  Alcohol less than 10.  The patient has several overdose  attempts in the past.   PAST PSYCHIATRIC HISTORY:  Multiple psychiatric admissions.  Drug use began  around age 15 and started out with marijuana.  Most recent medications  prescribed by primary care physician Dr. Renato Gails at [facility].   ADMISSION MEDICATIONS:  Symbyax, gained 30 pounds on this, Neurontin 600 mg  q.i.d., Wellbutrin XL 150 mg q.a.m., and trazodone 300 mg q.h.s., Protonix  40 mg q.a.m. and Vicodin p.r.n. pain.   ALLERGIES:  SULFA, caused diarrhea.   PHYSICAL EXAMINATION:  Essentially within normal limits, neurologically  nonfocal.   ROUTINE ADMISSION LABS:  Essentially within normal limits except for urine  drug screen.   MENTAL STATUS EXAM:  Alert, oriented, speech within normal limits,goal  directed, reporting suicidal ideation, upset that she is once again homeless  and without friends or income.  No homicidal ideation, no  psychotic  symptoms, cognition intact.  No memory of the seizure or blackout from using  crack cocaine the other day.  Partial insight.   ADMISSION DIAGNOSES:   AXIS I:  1. Polysubstance abuse with cocaine, marijuana, benzodiazepines abuse,     possible opiate abuse.  2. History of bipolar disorder, although this may be substance-induced mood     disorder not otherwise specified.   AXIS II:  Rule out borderline personality disorder.   AXIS III:  Morbid obesity, chronic back pain.   AXIS IV:  Severe stressors, occupation, living situation, financial, support  system, other psychosocial stressors.   AXIS V:  35/55-60.   HOSPITAL COURSE:  The patient was admitted and ordered routine p.r.n.  medications, underwent further monitoring, and was encouraged to participate  in individual, group and milieu therapy.  Medications were resumed.  Albuterol and Vicodin ordered, seizure precautions.  The patient was  restarted  on Neurontin, Cymbalta, trazodone, Protonix, Vicodin.  The patient  participated in aftercare planning and substance abuse therapy.  She was  optimized on Neurontin, Trileptal for mood instability, and Cymbalta was  changed to the morning, also Symmetrel for cocaine cravings and Seroquel  p.r.n. agitation.  The patient was wanting to go to a residential program  and case management assisted with finding what options were available.  The  patient was accepted at [facility] in [place] as well as could go to [facility] if not  able to get into [facility].  She was discharged in improved condition, no acute  withdrawal symptoms, more stable mood, improved judgment and insight, coping  skills, no dangerous ideation, psychotic symptoms and motivated to be sober  and to participate in her aftercare relapse prevention plan. The patient was  given medication education.   DISCHARGE MEDICATIONS:  1. Protonix 40 mg q.a.m.  2. Trazodone 150 mg two q.h.s.  3. Trileptal 300 mg 1/2 q.a.m., 1/2 q.4 p.m.,  and 1/2 q.h.s.  4. Cymbalta 60 mg q.a.m.  5. Symmetrel 100 mg b.i.d.  6. Neurontin 300 mg 1 t.i.d. and 3 q.h.s.  7. Seroquel 25 mg 2 tabs up to 3 times a day.   DISPOSITION:  The patient was to follow up with Fransisca Kaufmann May 9 at 12  noon, residential treatment services.   DISCHARGE DIAGNOSES:   AXIS I:  1. Polysubstance abuse with cocaine, marijuana, benzodiazepines abuse,     possible opiate abuse.  2. History of bipolar disorder, although this may be substance-induced mood     disorder not otherwise specified.   AXIS II:  Rule out borderline personality disorder.   AXIS III:  Morbid obesity, chronic back pain.   AXIS IV:  Severe stressors, occupation, living situation, financial, support  system, other psychosocial stressors.   AXIS V:  Global assessment of function on discharge was 55.                                               Jeanice Lim, M.D.    JEM/MEDQ  D:  08/29/2003  T:  08/30/2003  Job:  [id number]

## 2010-08-04 NOTE — H&P (Signed)
NAME:  Mallory Medina, Mallory Medina NO.:  [id number]   MEDICAL RECORD NO.:  [id number]                   PATIENT TYPE:  IPS   LOCATION:  0301                                 FACILITY:  [facility]   PHYSICIAN:  Jeanice Lim, M.D.              DATE OF BIRTH:  10/28/1965   DATE OF ADMISSION:  07/16/2003  DATE OF DISCHARGE:                         PSYCHIATRIC ADMISSION ASSESSMENT   IDENTIFYING INFORMATION:  This is a 45 year old white separated female.  She  relapsed on Tuesday, July 13, 2003 on crack cocaine.  Apparently, she  suffered a seizure.  Her family found her unresponsive late in the afternoon  and she was brought to the emergency room around 6:30 on Tuesday evening.  The patient states that her relapse was after the pressures of multiple  psychosocial stressors including a recent suicide attempt by her daughter  who is age 68, her father having a heart attack and her mother being aware  of her relapse on drugs and turning her out of her home so, hence, she is  once again homeless.  In the emergency room, her urine drug screen was  positive for cocaine, opiates, benzodiazepines, THC.  It was in fact  negative for TCA and acetaminophen.  Her alcohol level was less than 10.  Unfortunately, this was one of several overdose attempts and it is also one  of several admissions to the hospital for the same.   PAST PSYCHIATRIC HISTORY:  She has had probably 4-5 psychiatric admissions.  She notes that her drug use began around age 8.  She started out with  marijuana.  She had no emotional history prior to beginning her drug use.  Her most recent medications have been prescribed by her primary care  physician, Dr. Lurena Joiner, at [facility].   MEDICATIONS:  Her medications, currently, are Symbyax 50\12 mg q.h.s.,  although she has gained 30 pounds on this, Neurontin 600 mg q.i.d.,  Wellbutrin XL 150 mg in the a.m. and at noon, trazodone 300 mg q.h.s.,  Protonix 40 mg  q.a.m. and Vicodin 2 q.6h. p.r.n. pain.   SOCIAL HISTORY:  She is currently separated from her boyfriend.  Her three  daughters, age 19, 82 and 24, live with her former husband, their father.  She currently has no income.  She states that, due to chronic back pain, it  makes it difficult for her to work.  She does have a Medicaid card but,  other than that, no income.   FAMILY HISTORY:  She states that all of her grandparents used alcohol.  They  were not necessarily alcoholics but they all used alcohol.   ALLERGIES:  SULFA (this gives her diarrhea).   PHYSICAL EXAMINATION:  She was admitted to the hospital on the 26th where  she was stabilized.  Her physical examination is not repeated at this time.  It is well-documented in the chart.   MENTAL STATUS  EXAM:  She is alert and oriented in all realms.  Her speech is  within normal limits.  Her thought processes are clear, rational and goal-  oriented.  Her thought content shows that she is acknowledging that she is  still suicidal, although this is mostly self-pitying as she finds herself,  once again, without friends or income, etc.  She is negative for homicidal  ideation.  She is negative for hallucinations and delusions.  Her memory is  intact.  She has no memory of seizure or blackouts from using crack the  other day.  Her affect is appropriate to the situation and her mood, she  reports, is up and down multiple times during the day although, sitting her  in the interview, she is normal.  Her judgment is within normal limits.  She  has some insight.   DIAGNOSES:   AXIS I:  1. Recurrent polysubstance abuse, specifically cocaine, marijuana.  She is     prescribed Vicodin and she stole her mother's benzodiazepines and     overdosed on those.  2. History for bipolar disorder.  However, I feel this is substance abuse-     induced mood disorder.   AXIS II:  Rule out borderline.   AXIS III:  1. Morbid obesity.  2. Chronic back  pain.   AXIS IV:  Severe (stressors in all levels).   AXIS V:  Probably 40 at the moment.   PLAN:  Continue further stabilization and to help identify long-term  substance abuse treatment for her.     Vic Ripper, P.A.-C.               Jeanice Lim, M.D.    MD/MEDQ  D:  07/17/2003  T:  07/17/2003  Job:  [id number]

## 2010-08-04 NOTE — Assessment & Plan Note (Signed)
REASON FOR VISIT:  Mallory Medina is back regarding her low back pain.  She had an  L2-L3 translaminar epidural injection performed on the right side with good  results in December 2004.  This improved a lot of her anterior leg pain.  Approximately 2-3 weeks ago she had increased pain, however, in the  posterolateral legs, left greater than right side.  She does not remember  any type of fall or accident.  She rates her pain at a 10/10 today.  She has  been taking increased doses of Vicodin, using 1-2 q.8h.  The pain goes from  the back into the buttock area and down the knee to the foot.  She does  report some numbness in the leg.  No frank weakness.   REVIEW OF SYSTEMS:  The patient does note occasional chest pain, cold  symptoms, and some coughing.  Weakness, numbness in the left lower extremity  is noted, and some dizziness.  Mood has been decreased, has had some  problems with sleep and suicidal ideations.  She does report reflux and  diarrhea.  Denies vomiting, nausea, and constipation.  Does report some  recent weight gain.   PHYSICAL EXAMINATION TODAY:  The patient is stable and in no acute distress.  Blood pressure is 115/73, pulse is 86, respiratory rate 16, her temperature  is 98.  She does walk with a limp to the left side.  She is generally  appropriate in conversation and nonagitated.  The examination of the low  back revealed some tenderness in both PSIS areas as well as the lower lumbar  paraspinals.  The postoperative scar is noted.  She had limited forward  flexion of approximately 30 degrees, lateral bending and rotation of  approximately 20 degrees to either side, and extension to 20 degrees.  She  had a positive straight leg test on the left more so than the right.  Reflexes were 2+ at the knees and 1+ at both ankles.  Sensory exam was  inconsistent in the left leg today.  She had fair range of motion of the  knee and foot.  Skin was intact for color and temperature.   Patrick's test  was equivocal as the patient had significant pain with any type of hip  flexion today on the left side.   ASSESSMENT:  1. Post laminectomy syndrome.  2. Lumbar radiculopathy at the L3 nerve which is improved.  3. L4-L5 disc disease and probable L5 radiculopathy acutely causing     increased pain.  4. Manic depression.   PLAN:  1. Will have the patient return for a nonselective L4-L5 epidural injection.     Once again, symptoms are more predominant on the one side (left) but she     is having bilateral symptomatology.  She may need a series of injections.  2. Will continue her on her Neurontin at 600 mg q.i.d.  3. For pain we will begin her on low-dose Kadian 20 mg daily.  Dispense #30     today.  4. She can use Vicodin for breakthrough pain one to two q.6h. p.r.n. #60.  5. Ultimately after the pain subsides and she is more mobile I would like to     initiate physical therapy.  6. Will see the patient back pending the completion of her injections.      Ranelle Oyster, M.D.   ZTS/MedQ  D:  05/04/2003 15:39:56  T:  05/04/2003 16:43:23  Job #:  [id number]   cc:  [name], M.D.  [address]  [place]  [place] [address]  Fax: [phone]

## 2010-08-04 NOTE — Op Note (Signed)
Mallory Medina, KITE NO.:  [id number]   MEDICAL RECORD NO.:  [id number]          PATIENT TYPE:  INP   LOCATION:  5018                         FACILITY:  [facility]   PHYSICIAN:  Nelda Severe, MD      DATE OF BIRTH:  October 24, 1965   DATE OF PROCEDURE:  07/31/2005  DATE OF DISCHARGE:  08/01/2005                                 OPERATIVE REPORT   PREOPERATIVE DIAGNOSIS:  Lumbar spondylosis and stenosis status post  laminectomy.   POSTOPERATIVE DIAGNOSIS:  Lumbar spondylosis and stenosis status post  laminectomy.   OPERATION PERFORMED:  Aborted revision laminectomy and two-level fusion at  L4-5, L5-S1 secondary to possible aspiration pneumonia, left lung.   SURGEON:  Nelda Severe, MD   ANESTHESIA:  Sheldon Silvan, M.D.   INDICATIONS FOR PROCEDURE:  The patient was admitted for management of back  and left lower extremity pain.  The plan was to perform a one-level  revisional laminectomy at L5-S1 followed by two-level fusion at L4-5, L5-S1  with instrumentation and autogenous bone graft.   DESCRIPTION OF PROCEDURE:  She was taken to the operating room and general  anesthetic with intubation given.  A large quantity of fluid was aspirated  through a nasogastric tube from her stomach.  She was positioned prone on  the [place] frame.  Care was taken to position the upper extremities so as  to avoid hyperflexion, abduction of the shoulders and so as to avoid  hyperflexion of the elbows.  The upper extremities were padded with foam.  The hips and knees were gently flexed and supported on pillows.  The lumbar  area was prepped with DuraPrep and draped in rectangular fashion.  The  drapes were secured with Ioban.   I had just scored the proposed incision through the epidermis into the  dermis in preparation for injecting the subcutaneous layer with a mixture of  0.25% Marcaine with epinephrine and 1% lidocaine with epinephrine, when Dr.  Ivin Booty,  the anesthesiologist told he  to stop.  We did continue with the  injection, but no more incision was made.  The problem was that there was a  malposition of the endotracheal tube.  A chest x-ray taken with the patient  prone on the [place] frame revealed the ET tube to be in the right mainstem  bronchus.  We then applied a sterile dressing and turned the patient onto a  gurney into the supine position.  The tube was readjusted and another x-ray  taken.  The x-ray was consistent with the possibility of an aspiration  pneumonia of the left lung.  Therefore we decided not to proceed with the  surgery.  She was placed briefly back on the [place] frame while the  dressing was removed and Steri-Strips applied to the epidermal incision and  a new Xeroform gauze dressing applied and secured with OpSite.   She was then awakened and transferred to the recovery room where she was  assessed by the critical care pulmonology service.  They felt that it was  probably atelectasis, but that we would observe her for possible  aspiration  pneumonia.      Nelda Severe, MD  Electronically Signed     MT/MEDQ  D:  08/01/2005  T:  08/01/2005  Job:  [id number]

## 2010-08-04 NOTE — Procedures (Signed)
NAMEKAVINA, CANTAVE NO.:  [id number]   MEDICAL RECORD NO.:  [id number]          PATIENT TYPE:  WOC   LOCATION:  WOC                          FACILITY:  [facility]   PHYSICIAN:  Erick Colace, M.D.DATE OF BIRTH:  14-Jan-1966   DATE OF PROCEDURE:  DATE OF DISCHARGE:                                 OPERATIVE REPORT   Patient referred for lumbar epidural injection.  She is status post  laminectomy L5-S1.  For that reason, could not perform requested L5-S1  translaminar.  Therefore, given increased pain in the right side versus left  side, went with right S1 transforaminal.   Informed consent was obtained, after describing risks, benefits and  procedure with the patient.  These include bleeding, bruising, infection,  loss of bowel and bladder function, death or paralysis.  She elects to  proceed and has given written consent.  Patient placed prone on fluoroscopy  table.  Betadine prep, sterile drape. A 25-gauge inch and a half needle was  used in skin and subcutaneous tissues, 1% lidocaine x3 mL and a 22-gauge 3-  1/2 inch spinal needle was inserted under fluoroscopic guidance and a right  S1 foramen under live fluoroscopic injection using IV extension tube, being  Omnipaque 180 demonstrated no intravascular uptake and therefore, solution  containing 1 mL of 40 mg/mL Depo-Medrol and 2 mL of 1% lidocaine  methylparaben-free were injected.  Patient tolerated the procedure well.  Post injection instructions given.  If no improvement with this injection,  would try a caudal epidural steroid injection, otherwise would go on to left  S1 since she had more left-sided symptomatology.       AEK/MEDQ  D:  10/02/2004 14:27:13  T:  10/03/2004 17:20:35  Job:  [id number]

## 2010-08-04 NOTE — Procedures (Signed)
NAME:  Mallory Medina, Mallory Medina                          ACCOUNT NO.:  [id number]   MEDICAL RECORD NO.:  [id number]                   PATIENT TYPE:  REC   LOCATION:  TPC                                  FACILITY:  [facility]   PHYSICIAN:  Erick Colace, M.D.           DATE OF BIRTH:  09-Apr-1965   DATE OF PROCEDURE:  DATE OF DISCHARGE:                                 OPERATIVE REPORT   PROCEDURE:  L2-3 translaminar epidural steroid injection.   INDICATIONS:  L2-3 disk impinging on right L3 nerve root with low back pain  and leg pain, right greater than left.   INFORMED CONSENT:  Risks and benefits of the procedure were explained to the  patient including bleeding, bruising, infection, paralysis, loss of bowel  and bladder functioning; patient elects to proceed.   DESCRIPTION OF PROCEDURE:  Patient placed prone on fluoroscopy table.  Under  multiple fluoroscopic images, a 3.5-inch 18-gauge Hustead needle was  advanced into proper positioning.  Loss of resistance was not clear.  AP and  lateral fluoroscopic images confirmed needle location and injection of  Omnipaque 180 showed epidural spread without evidence of intravascular  uptake.  Then a solution containing 2 mL of 40-mg-per-mL Kenalog and 3 mL of  1% methylparaben-free lidocaine were injected.  The patient tolerated the  procedure well.   Post-injection instructions given.  The patient will follow up in three to  four weeks.  If not significant result, would do an L3 selective nerve root  block/transforaminal epidural steroid injection.                                                Erick Colace, M.D.    AEK/MEDQ  D:  02/25/2003 17:01:26  T:  02/26/2003 04:30:54  Job:  [id number]   cc:   [name], M.D.  [address]  [place]  [place] [address]  Fax: [phone]   Ranelle Oyster, M.D.  [address] [place]  [place] [address]  Fax: [phone]

## 2010-08-04 NOTE — Procedures (Signed)
NAME:  Mallory, Medina                          ACCOUNT NO.:  [id number]   MEDICAL RECORD NO.:  [id number]                   PATIENT TYPE:  REC   LOCATION:  TPC                                  FACILITY:  [facility]   PHYSICIAN:  Erick Colace, M.D.           DATE OF BIRTH:  Jul 29, 1965   DATE OF PROCEDURE:  DATE OF DISCHARGE:                                 OPERATIVE REPORT   HISTORY:  [age]-year-old female referred for chronic low back pain,  severe, with right-greater-than-left lower extremity posterior thigh pain.   PROCEDURE:  L2-3 nonselective epidural steroid injection.   INDICATION:  MRI, April 2004, demonstrated broad-based disk extrusion, L2-3.   INFORMED CONSENT:  Informed consent was obtained after describing risks and  benefits of the procedure to the patient.  Risks included bleeding,  bruising, infection, paralysis, loss of bowel and bladder function; the  patient wished to proceed.   DESCRIPTION OF PROCEDURE:  Patient was placed prone on the fluoroscopy  table, Betadine prep, marked, draped, skin and subcu tissues anesthetized  with 2 mL of 1% lidocaine.  An 18-gauge Hustead 3.5-inch needle was advanced  to bone contact under multiple fluoroscopic images.  Loss-of-resistance  technique showed no obvious loss, but both AP and lateral showed appropriate  placement and live fluoroscopic injection of Omnipaque 180 x1 mL  demonstrated epidural spread.  The patient tolerated the procedure well.   Post-injections instructions given.  I will see her back in three to four  weeks.  If no significant benefit, would do an L3 selective nerve root block  at that time.   Pre-injection pain level was 9; post-injection pain level 5/10.                                                Erick Colace, M.D.    AEK/MEDQ  D:  02/25/2003 16:58:48  T:  02/26/2003 04:54:09  Job:  [id number]   cc:   Ranelle Oyster, M.D.  [address] [place]  [place] [address]  Fax: [phone]

## 2010-08-04 NOTE — Assessment & Plan Note (Signed)
MEDICAL RECORD NUMBER:  Is #[id number].   Mallory Medina is back regarding her low back pain.  She has been stable from the  standpoint of her back pain.  It is essentially a 6 out of 10 __________ the  low back and somewhat into the left leg.  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  She is doing  five hour days.  She reports a 20 pound weight loss as well.  She is hoping  the decreased physical demands of this job will help her somewhat.  She  feels that the Kadian was somewhat beneficial.  It remains at 20 mg b.i.d.  She is concerned somewhat with the leg pain that she is having.  The pain is  usually worse with walking, bending, sitting, working and better with rest  and medication.  Sleep is fair but sometimes still a problem.  Her pain  continues to effect social activities and ADLs.  She is unable to do some of  her leisure activities and certainly this is limiting her work capabilities  at this point.   REVIEW OF SYSTEMS:  The patient reports some wheezing, coughing, weakness,  numbness, depression, anxiety, reflux, problems with appetite and bowel  incontinence.  Does report a weight loss as mentioned above as well as some  sweating.  She is planning follow up with her PCP regarding her borderline  diabetes.  We have found a hemoglobin A1c at 6.4 on the last visit.   PHYSICAL EXAMINATION:  GENERAL:  The patient was generally pleasant and in  good spirits.  Affect is bright and appropriate.  Appearance is normal.  VITAL SIGNS:  Blood pressure is 112/79, pulse is 81, respiratory rate is 20.  She is sating 95% on room air.  GAIT:  The patient walks with a fair gait and posture.  MUSCULOSKELETAL:  She had some limitation once again with forward flexion at  45-50 degrees.  It seemed to be improved frankly to me today.  She had pain  __________  extended position.  Extension was 5-10 degrees in lateral  bending and rotation was 20 degrees  to either side.  Reflexes were 2+.  She  had no focal motor loss or sensory loss on either lower extremities today.  Straight leg testing remained equivocal.   ASSESSMENT:  1.  Post laminectomy syndrome.  2.  Lumbar radiculopathy.  3.  Manic depression which is controlled.  4.  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   PLAN:  1.  Continue titrating Kadian to 30 mg b.i.d.  2.  We will add another dose of Trileptal, give her 300 mg at bedtime and      150 mg each in the morning and afternoon.  3.  She will use her Vicodin 5/500 for breakthrough pain, 1-2 q.6h. p.r.n.  4.  The patient will follow up with her PCP regarding her sugars.  5.  The patient will continue with exercise and weight loss.  6.  We did no trigger point injections today.  7.  I will see the patient back in about one to two months' time.       ZTS/MedQ  D:  01/26/2004 13:03:25  T:  01/26/2004 16:17:14  Job #:  [id number]

## 2010-08-04 NOTE — H&P (Signed)
Mallory Medina, Mallory Medina NO.:  [id number]   MEDICAL RECORD NO.:  [id number]                   PATIENT TYPE:  IPS   LOCATION:  0503                                 [facility]ILITY[facility]   PHYSICIAN:  Geoffery Lyons, M.D.                   DATE OF BIRTH:  10-Apr-1965   DATE OF ADMISSION:  08/05/2002  DATE OF DISCHARGE:                         PSYCHIATRIC ADMISSION ASSESSMENT   IDENTIFYING INFORMATION:  A 45 year old single white female, voluntarily  admitted on Aug 06, 2002.   HISTORY OF PRESENT ILLNESS:  The patient presents with a history of  intentional overdose, overdosing on 400 tablets of Tylenol.  The patient  said she had the medication at home.  She did not realize how dangerous it  was.  She did it at home.  She states no one was there.  The patient left a  note for her boyfriend and her mother and her daughter.  Her stated  intention was to die.  Her stressors include that she stole money from her  mother and her employer.  She relapsed on crack cocaine about a month prior.  Her stressors are relapsing, stealing, and feeling very guilty over this  situation.  She denies any current suicidal ideation.  She states she wants  to live.  Denies any psychotic symptoms.   PAST PSYCHIATRIC HISTORY:  First hospitalization [facility],  no other psychiatric admissions.  Was in rehab 8 years ago for cocaine.  Longest history of being clean has been 4 years.  The patient states she  used to see a therapist in the past.  Her name was Gaspar Cola.   SOCIAL HISTORY:  She is a 45 year old separated white female, separated for  5 years, has 3 children ages 51, 13, and 69.  The patient's mother has the  60 year old, the 47 year old is in foster care, and the 45 year old is with  the biological father.  The patient lives with her boyfriend.  She trains  horses for show.  She reports no legal problems.   FAMILY HISTORY:  None.   ALCOHOL DRUG HISTORY:   Smokes, occasion alcohol use, has been relapsed on  crack cocaine.   PAST MEDICAL HISTORY:  Primary care Frankee Gritz is [facility].  Medical problems are back problems, has had 2 back surgeries, and asthma.   MEDICATIONS:  Has been on Prozac 40 mg every day, has been on that for 6  months.  Was on Effexor in the past but found that ineffective.   DRUG ALLERGIES:  SULFA, MORPHINE.   PHYSICAL EXAMINATION:  Done while the patient was hospitalized after her  overdose at [facility].  Tylenol level on admission was 837.4.  Her SGOT was  492, down to 38 at May 20.  Urine pregnancy test was negative.  BUN was 4,  potassium was 2.8 on admission.  Head CT scan was negative.  The patient was  found unresponsive after her overdose and was on a ventilator for support  and was hospitalized for approximately 5 days.   MENTAL STATUS EXAMINATION:  She is alert, oriented, cooperative female, good  eye contact, casually dressed.  Speech is clear, mood is depressed, affect  is flat.  The patient is agreeable, however, and receptive to medications  and treatment plan.  Thought processes are coherent, no evidence of  psychosis, no auditory or visual hallucinations.  Cognitive function intact.  Memory is fair, judgment and insight are poor, poor impulse control.    ADMISSION DIAGNOSES:   AXIS I:  1. Major depression, severe.  2. Cocaine abuse.   AXIS II:  Deferred.   AXIS III:  Status post Tylenol overdose and chronic back pain and asthma.   AXIS IV:  Problems with primary support group, economic, other psychosocial  problems.   AXIS V:  Current is 30, estimated this past year 67.   PLAN:  Voluntary admission for intentional overdose.  Contract for safety,  check every 15 minutes.  Will stabilize mood and thinking so the patient can  be safe.  Will monitor labs.  Medication compliance was discussed with the  patient.  We will order Neurontin for pain and mood stabilization, will add   Wellbutrin for depressive symptoms, will continue with nebulizer treatments,  discourage smoking due to patient's complaints of asthma.  Smoking cessation  was discussed with the patient.  Will have a family session with boyfriend.  The patient is to attend AA and NA meetings and mental health.  The patient  is to remain alcohol and drug free.   TENTATIVE LENGTH OF CARE:  Three to five days or more depending on the  patient's response to medication.      Landry Corporal, N.P.                       Geoffery Lyons, M.D.    JO/MEDQ  D:  08/07/2002  T:  08/07/2002  Job:  [id number]

## 2010-08-04 NOTE — Assessment & Plan Note (Signed)
MEDICAL RECORD NUMBER:  [id number].   SUBJECTIVE:  Mallory Medina is back regarding her low-back pain.  She was recently  released from her half-way house about one month or six weeks ago.  She is  doing much better emotionally.  She is back with her parents.  She has  returned to part-time work.  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  She has been doing it for  about 2-1/2 weeks at this point.  She has been using p.r.n. Vicodin 5/500  for breakthrough pain.  The pain ranges from a 4-8/10.  It certainly is  related to her amount of activity, particularly on the job.  The pain is  made worse with walking, bending, sitting and working, and improves  generally a little bit with her medications.  She is continued on her same  psychiatric medications which include her Seroquel, Cymbalta, Neurontin,  trazodone and Trileptal.  She has been on Kadian in the past and felt that  this was helpful, but had not continued this after going to the inpatient  transitional program.   REVIEW OF SYSTEMS:  The patient reports a history of chest pain, shortness  of breath, swelling, coughing, heart trouble.  Nothing new in the last few  months.  She does report weakness, numbness, problems with depression,  suicidal thoughts and headaches. She reports reflux and poor appetite.  Other pertinent positives and negative are listed in the review of systems  section and health and history portion of the chart.   OBJECTIVE:  On physical examination today, blood pressure 128/66, pulse 112,  respirations 18.  She is saturating 95% on room air.  The patient walks with  a slightly rigid gait with hyperlordotic posture.  Affect is flat, but  fairly bright.  Appearance is normal and well kept.  Examination of the low  back today, she had continued tenderness in the lower lumbar and sacral  regions at the perioperative site.  She had limited flexion today and was  only able to  go to about 45 degrees in the forward plane, and 10 degrees in  the posterior plane.  Collateral bending and rotation was about 20 degrees  either side.  Reflexes remained 2+ at both the knees and ankles.  Motor  function remained 5/5 throughout.  Sensory exam appeared intact.  Straight  leg testing was equivocal.  She had pain really with all movement as well as  her Patrick's test.   ASSESSMENT:  1.  Post laminectomy syndrome.  2.  Lumbar radiculopathy.  3.  Manic depression, status post suicide attempts.   PLAN:  1.  I restarted the Kadian at 20 mg daily.  2.  We gave her a trial of lidocaine patches 5% to place over the lumbar      surgical site.  She will use one to two a day as needed.  3.  We refilled Vicodin 5/500 for breakthrough pain.  4.  We discussed a regular stretching exercise program that she should      follow both at work and while she is off of work.  5.  The patient seems to be doing well emotionally at this point.  She will      continue with her current medications, but does need formal psychiatric      maintenance and followup.      Ranelle Oyster, M.D.   ZTS/MedQ  D:  11/26/2003 15:52:30  T:  11/27/2003 23:25:07  Job #:  [id number]

## 2010-12-08 LAB — POCT URINALYSIS DIP (DEVICE)
Glucose, UA: NEGATIVE
Operator id: 239701
Protein, ur: NEGATIVE
Urobilinogen, UA: 0.2

## 2010-12-11 LAB — URINALYSIS, ROUTINE W REFLEX MICROSCOPIC
Glucose, UA: NEGATIVE
Ketones, ur: NEGATIVE
Leukocytes, UA: NEGATIVE
pH: 5.5

## 2010-12-11 LAB — CBC
Hemoglobin: 11.1 — ABNORMAL LOW
Hemoglobin: 12.2
MCHC: 34.6
MCHC: 35.1
MCV: 92.4
MCV: 93.3
Platelets: 161
RBC: 4.57
RBC: 3.73 — ABNORMAL LOW
RBC: 3.76 — ABNORMAL LOW
RDW: 13.6
WBC: 7.7
WBC: 7.6

## 2010-12-11 LAB — COMPREHENSIVE METABOLIC PANEL
ALT: 180 — ABNORMAL HIGH
AST: 36
Albumin: 3.8
Alkaline Phosphatase: 98
Chloride: 100
GFR calc Af Amer: >60
Potassium: 4.7
Sodium: 136
Total Bilirubin: 0.6

## 2010-12-11 LAB — DIFFERENTIAL
Basophils Absolute: 0
Basophils Relative: 0
Eosinophils Absolute: 0.2
Eosinophils Relative: 3
Lymphocytes Relative: 27
Monocytes Absolute: 0.5

## 2010-12-11 LAB — URINE CULTURE: Colony Count: >=100000

## 2010-12-11 LAB — URINE MICROSCOPIC-ADD ON

## 2010-12-11 LAB — BASIC METABOLIC PANEL
CO2: 29
CO2: 34 — ABNORMAL HIGH
Calcium: 9
Calcium: 9.2
Chloride: 99
Creatinine, Ser: 0.68
Creatinine, Ser: 0.74
GFR calc Af Amer: >60
GFR calc Af Amer: >60
Glucose, Bld: 129 — ABNORMAL HIGH
Sodium: 136
Sodium: 137

## 2010-12-11 LAB — TYPE AND SCREEN: Antibody Screen: NEGATIVE

## 2010-12-11 LAB — HEMOGLOBIN A1C: Mean Plasma Glucose: 133

## 2012-01-20 ENCOUNTER — Emergency Department (HOSPITAL_COMMUNITY)
Admission: EM | Admit: 2012-01-20 | Discharge: 2012-01-20 | Disposition: A | Discharge status: Home / Self Care | Payer: Self-pay | Attending: Emergency Medicine | Admitting: Emergency Medicine

## 2012-01-20 DIAGNOSIS — IMO0002 Reserved for concepts with insufficient information to code with codable children: Secondary | ICD-10-CM | POA: Insufficient documentation

## 2012-01-20 DIAGNOSIS — M549 Dorsalgia, unspecified: Secondary | ICD-10-CM

## 2012-01-20 DIAGNOSIS — M79609 Pain in unspecified limb: Secondary | ICD-10-CM | POA: Insufficient documentation

## 2012-01-20 DIAGNOSIS — M5416 Radiculopathy, lumbar region: Secondary | ICD-10-CM

## 2012-01-20 MED ORDER — OXYCODONE-ACETAMINOPHEN 5-325 MG PO TABS: 1.0000 | ORAL_TABLET | ORAL | Status: DC | PRN

## 2012-01-20 MED ORDER — CYCLOBENZAPRINE HCL 10 MG PO TABS: 10.0000 mg | ORAL_TABLET | Freq: Two times a day (BID) | ORAL | Status: DC | PRN

## 2012-01-20 MED ORDER — OXYCODONE-ACETAMINOPHEN 5-325 MG PO TABS
2.0000 | ORAL_TABLET | Freq: Once | ORAL | Status: AC
  Administered 2012-01-20: 2 via ORAL
  Filled 2012-01-20: qty 2

## 2012-01-20 MED ORDER — CYCLOBENZAPRINE HCL 10 MG PO TABS
10.0000 mg | ORAL_TABLET | Freq: Once | ORAL | Status: AC
  Administered 2012-01-20: 10 mg via ORAL
  Filled 2012-01-20: qty 1
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
## 2012-01-20 NOTE — ED Notes (Signed)
Per [facility], pt states that she was diagnosed with scoliosis when she was 46yo; she also states that she has fibromyalgia. Currently not taking any medicines. Allergy: Sulfa. Symptoms have been persisting for 4 days.

## 2012-01-23 NOTE — ED Provider Notes (Signed)
History     CSN: [id number]  Arrival date & time 01/20/12  1611   First MD Initiated Contact with Patient 01/20/12 1720      Chief Complaint  Patient presents with  . Back Pain    Bilateral  . Leg Pain    Bilateral    (Consider location/radiation/quality/duration/timing/severity/associated sxs/prior treatment) HPI Comments: LAQUANNA KASPRZYK is a 46 y.o. Female with ongoing, recurrent, back, pain. No recent trauma. She has pain radiating down both legs. No urinary or bowel incontinence. No dysuria, or urinary frequency . No fever, chills, nausea, vomiting. She's not tried a medication for the problems. The pain is worse with movement. It improves with rest.  Patient is a 46 y.o. female presenting with back pain and leg pain. The history is provided by the patient.  Back Pain  Associated symptoms include leg pain.  Leg Pain     No past medical history on file.  No past surgical history on file.  No family history on file.  History  Substance Use Topics  . Smoking status: Not on file  . Smokeless tobacco: Not on file  . Alcohol Use: Not on file    OB History    No data available      Review of Systems  Musculoskeletal: Positive for back pain.  All other systems reviewed and are negative.    Allergies  Sulfa antibiotics  Home Medications   Current Outpatient Rx  Name  Route  Sig  Dispense  Refill  . DIMENHYDRINATE 50 MG PO TABS   Oral   Take 50 mg by mouth every 8 (eight) hours as needed. For nausea         . CYCLOBENZAPRINE HCL 10 MG PO TABS   Oral   Take 1 tablet (10 mg total) by mouth 2 (two) times daily as needed for muscle spasms.   20 tablet   0   . OXYCODONE-ACETAMINOPHEN 5-325 MG PO TABS   Oral   Take 1 tablet by mouth every 4 (four) hours as needed for pain.   20 tablet   0     BP 146/83  Pulse 102  Temp 99.1 F (37.3 C) (Oral)  Resp 16  SpO2 99%  Physical Exam  Nursing note and vitals reviewed. Constitutional: She is oriented  to person, place, and time. She appears well-developed and well-nourished.  HENT:  Head: Normocephalic and atraumatic.  Eyes: Conjunctivae normal and EOM are normal. Pupils are equal, round, and reactive to light.  Neck: Normal range of motion and phonation normal. Neck supple.  Cardiovascular: Normal rate, regular rhythm and intact distal pulses.   Pulmonary/Chest: Effort normal and breath sounds normal. She exhibits no tenderness.  Abdominal: Soft. She exhibits no distension. There is no tenderness. There is no guarding.  Musculoskeletal: Normal range of motion.       Mild lumbar pain with palpation to  Neurological: She is alert and oriented to person, place, and time. She has normal strength. She exhibits normal muscle tone.  Skin: Skin is warm and dry.  Psychiatric: She has a normal mood and affect. Her behavior is normal. Judgment and thought content normal.    ED Course  Procedures (including critical care time)   Nursing notes, applicable records and vitals reviewed.      1. Back pain   2. Lumbar radicular pain       MDM  Nonspecific recurrent low back pain. Doubt significant myelopathy, discitis, or occult infection.Doubt metabolic instability, serious  bacterial infection or impending vascular collapse; the patient is stable for discharge.    Plan: Home Medications- Percocet; Home Treatments- Rest; Recommended follow up- PCP prn        Flint Melter, MD 01/23/12 [phone]

## 2012-11-26 ENCOUNTER — Inpatient Hospital Stay (HOSPITAL_BASED_OUTPATIENT_CLINIC_OR_DEPARTMENT_OTHER)
Admission: EM | Admit: 2012-11-26 | Discharge: 2012-12-01 | DRG: 189 | Disposition: A | Discharge status: Home / Self Care | Payer: Self-pay | Attending: Internal Medicine | Admitting: Internal Medicine

## 2012-11-26 ENCOUNTER — Emergency Department (HOSPITAL_BASED_OUTPATIENT_CLINIC_OR_DEPARTMENT_OTHER): Payer: Self-pay

## 2012-11-26 ENCOUNTER — Encounter (HOSPITAL_BASED_OUTPATIENT_CLINIC_OR_DEPARTMENT_OTHER): Payer: Self-pay

## 2012-11-26 DIAGNOSIS — J9600 Acute respiratory failure, unspecified whether with hypoxia or hypercapnia: Principal | ICD-10-CM | POA: Diagnosis present

## 2012-11-26 DIAGNOSIS — J441 Chronic obstructive pulmonary disease with (acute) exacerbation: Secondary | ICD-10-CM | POA: Diagnosis present

## 2012-11-26 DIAGNOSIS — IMO0001 Reserved for inherently not codable concepts without codable children: Secondary | ICD-10-CM

## 2012-11-26 DIAGNOSIS — J449 Chronic obstructive pulmonary disease, unspecified: Secondary | ICD-10-CM

## 2012-11-26 DIAGNOSIS — K5900 Constipation, unspecified: Secondary | ICD-10-CM | POA: Diagnosis not present

## 2012-11-26 DIAGNOSIS — F411 Generalized anxiety disorder: Secondary | ICD-10-CM

## 2012-11-26 DIAGNOSIS — D696 Thrombocytopenia, unspecified: Secondary | ICD-10-CM | POA: Diagnosis not present

## 2012-11-26 DIAGNOSIS — G8929 Other chronic pain: Secondary | ICD-10-CM | POA: Diagnosis present

## 2012-11-26 DIAGNOSIS — E119 Type 2 diabetes mellitus without complications: Secondary | ICD-10-CM | POA: Diagnosis present

## 2012-11-26 DIAGNOSIS — J189 Pneumonia, unspecified organism: Secondary | ICD-10-CM | POA: Diagnosis not present

## 2012-11-26 DIAGNOSIS — I10 Essential (primary) hypertension: Secondary | ICD-10-CM | POA: Diagnosis present

## 2012-11-26 DIAGNOSIS — F17200 Nicotine dependence, unspecified, uncomplicated: Secondary | ICD-10-CM | POA: Diagnosis present

## 2012-11-26 DIAGNOSIS — T380X5A Adverse effect of glucocorticoids and synthetic analogues, initial encounter: Secondary | ICD-10-CM | POA: Diagnosis present

## 2012-11-26 DIAGNOSIS — R739 Hyperglycemia, unspecified: Secondary | ICD-10-CM

## 2012-11-26 DIAGNOSIS — F1120 Opioid dependence, uncomplicated: Secondary | ICD-10-CM | POA: Diagnosis present

## 2012-11-26 DIAGNOSIS — F17213 Nicotine dependence, cigarettes, with withdrawal: Secondary | ICD-10-CM

## 2012-11-26 DIAGNOSIS — Z23 Encounter for immunization: Secondary | ICD-10-CM

## 2012-11-26 HISTORY — DX: Other chronic pain: G89.29

## 2012-11-26 HISTORY — DX: Chronic obstructive pulmonary disease, unspecified: J44.9

## 2012-11-26 HISTORY — DX: Type 2 diabetes mellitus without complications: E11.9

## 2012-11-26 HISTORY — DX: Other psychoactive substance abuse, uncomplicated: F19.10

## 2012-11-26 HISTORY — DX: Dorsalgia, unspecified: M54.9

## 2012-11-26 HISTORY — DX: Essential (primary) hypertension: I10

## 2012-11-26 LAB — CBC WITH DIFFERENTIAL/PLATELET
Basophils Absolute: 0 10*3/uL (ref 0.0–0.1)
Eosinophils Absolute: 0.1 10*3/uL (ref 0.0–0.7)
Eosinophils Relative: 1 % (ref 0–5)
HCT: 44.7 % (ref 36.0–46.0)
Lymphocytes Relative: 27 % (ref 12–46)
MCH: 31.7 pg (ref 26.0–34.0)
MCHC: 34.9 g/dL (ref 30.0–36.0)
MCV: 90.9 fL (ref 78.0–100.0)
Monocytes Absolute: 0.7 10*3/uL (ref 0.1–1.0)
Platelets: 125 10*3/uL — ABNORMAL LOW (ref 150–400)
RDW: 12.1 % (ref 11.5–15.5)

## 2012-11-26 LAB — BASIC METABOLIC PANEL
CO2: 31 mEq/L (ref 19–32)
Calcium: 9.8 mg/dL (ref 8.4–10.5)
Creatinine, Ser: 0.7 mg/dL (ref 0.50–1.10)
GFR calc non Af Amer: >90 mL/min (ref >90)
Sodium: 136 mEq/L (ref 135–145)

## 2012-11-26 MED ORDER — ACETAMINOPHEN 325 MG PO TABS
650.0000 mg | ORAL_TABLET | Freq: Once | ORAL | Status: AC
  Administered 2012-11-26: 650 mg via ORAL
  Filled 2012-11-26: qty 2

## 2012-11-26 MED ORDER — ALBUTEROL SULFATE (5 MG/ML) 0.5% IN NEBU
INHALATION_SOLUTION | RESPIRATORY_TRACT | Status: AC
  Administered 2012-11-26: 5 mg via RESPIRATORY_TRACT
  Filled 2012-11-26: qty 1

## 2012-11-26 MED ORDER — PREDNISONE 50 MG PO TABS
60.0000 mg | ORAL_TABLET | Freq: Once | ORAL | Status: AC
  Administered 2012-11-26: 60 mg via ORAL
  Filled 2012-11-26: qty 1

## 2012-11-26 MED ORDER — ALBUTEROL (5 MG/ML) CONTINUOUS INHALATION SOLN
INHALATION_SOLUTION | RESPIRATORY_TRACT | Status: AC
  Filled 2012-11-26: qty 20

## 2012-11-26 MED ORDER — IPRATROPIUM BROMIDE 0.02 % IN SOLN
RESPIRATORY_TRACT | Status: AC
  Administered 2012-11-26: 0.5 mg
  Filled 2012-11-26: qty 2.5

## 2012-11-26 MED ORDER — ALBUTEROL SULFATE (5 MG/ML) 0.5% IN NEBU
15.0000 mg/h | INHALATION_SOLUTION | Freq: Once | RESPIRATORY_TRACT | Status: AC
  Administered 2012-11-26: 15 mg/h via RESPIRATORY_TRACT

## 2012-11-26 MED ORDER — ALBUTEROL SULFATE (5 MG/ML) 0.5% IN NEBU
5.0000 mg | INHALATION_SOLUTION | Freq: Once | RESPIRATORY_TRACT | Status: AC
  Administered 2012-11-26: 5 mg via RESPIRATORY_TRACT

## 2012-11-26 MED ORDER — ALBUTEROL SULFATE (5 MG/ML) 0.5% IN NEBU
INHALATION_SOLUTION | RESPIRATORY_TRACT | Status: AC
  Administered 2012-11-26: 5 mg
  Filled 2012-11-26: qty 1

## 2012-11-26 NOTE — ED Notes (Signed)
Pt  Complains of body aches and severe back pain from coughing so much.  Pt reports unable to cough anything up.  Pt has congested cough that started 2 days ago.

## 2012-11-26 NOTE — ED Notes (Signed)
MD into re evaluate patient.  Pt still has sats 87-89% on 2L [place]

## 2012-11-26 NOTE — ED Provider Notes (Signed)
CSN: [id number]     Arrival date & time 11/26/12  2138 History  This chart was scribed for Charles B. Bernette Mayers, MD by Blanchard Kelch, ED Scribe. The patient was seen in room MH10/MH10. Patient's care was started at 10:14 PM.     Chief Complaint  Patient presents with  . Shortness of Breath  . Wheezing    The history is provided by the patient. No language interpreter was used.    HPI Comments: Mallory Medina is a 47 y.o. female with a history of COPD who presents to the Emergency Department complaining of constant cough with associated congestion and wheezing that began two days ago. Patient was given a breathing treatment with mild relief. Patient is a current every day smoker (pack/day).  She was put on steroids a month and a half ago for her breathing in [place]. She has never been admitted to the hospital for her breathing. She reports her mother died from diabetes and her father from hypertension.  Pt denies having a PCP currently.   Past Medical History  Diagnosis Date  . COPD (chronic obstructive pulmonary disease)   . Diabetes mellitus without complication   . Hypertension   . Back pain, chronic    Past Surgical History  Procedure Laterality Date  . Back surgery    . External fixation arm Left   . Knee arthroscopy Bilateral   . Tonsillectomy    . Tubal ligation     No family history on file. History  Substance Use Topics  . Smoking status: Current Every Day Smoker -- 1.00 packs/day    Types: Cigarettes  . Smokeless tobacco: Not on file  . Alcohol Use: No   OB History   Grav Para Term Preterm Abortions TAB SAB Ect Mult Living                 Review of Systems: A complete 10 system review of systems was obtained and all systems are negative except as noted in the HPI and PMH.    Allergies  Sulfa antibiotics  Home Medications   Current Outpatient Rx  Name  Route  Sig  Dispense  Refill  . methadone (DOLOPHINE) 10 MG tablet   Oral   Take 10 mg by mouth  every 8 (eight) hours. Pt goes to methadone clinic and takes 80mg          . cyclobenzaprine (FLEXERIL) 10 MG tablet   Oral   Take 1 tablet (10 mg total) by mouth 2 (two) times daily as needed for muscle spasms.   20 tablet   0   . dimenhyDRINATE (DRAMAMINE) 50 MG tablet   Oral   Take 50 mg by mouth every 8 (eight) hours as needed. For nausea         . oxyCODONE-acetaminophen (PERCOCET/ROXICET) 5-325 MG per tablet   Oral   Take 1 tablet by mouth every 4 (four) hours as needed for pain.   20 tablet   0    Triage Vitals: BP 171/92  Pulse 74  Temp(Src) 98.3 F (36.8 C) (Oral)  Resp 20  Ht 5\' 4"  (1.626 m)  Wt 165 lb (74.844 kg)  BMI 28.31 kg/m2  SpO2 98%  Physical Exam  Nursing note and vitals reviewed. Constitutional: She is oriented to person, place, and time. She appears well-developed and well-nourished.  HENT:  Head: Normocephalic and atraumatic.  Eyes: EOM are normal. Pupils are equal, round, and reactive to light.  Neck: Normal range of motion. Neck  supple.  Cardiovascular: Normal rate, normal heart sounds and intact distal pulses.   Pulmonary/Chest: Effort normal. She has wheezes.  Abdominal: Bowel sounds are normal. She exhibits no distension. There is no tenderness.  Musculoskeletal: Normal range of motion. She exhibits no edema and no tenderness.  Neurological: She is alert and oriented to person, place, and time. She has normal strength. No cranial nerve deficit or sensory deficit.  Skin: Skin is warm and dry. No rash noted.  Psychiatric: She has a normal mood and affect.    ED Course  Procedures (including critical care time)  DIAGNOSTIC STUDIES: Oxygen Saturation is 98% on room air, normal by my interpretation.    COORDINATION OF CARE:  10:18 PM - Will order prednizone another breathing treatment with follow up. Patient verbalizes understanding and agrees with treatment plan.    Labs Review Labs Reviewed - No data to display  Imaging  Review  Dg Chest 2 View  11/26/2012   *RADIOLOGY REPORT*  Clinical Data: Shortness of breath and wheezing  CHEST - 2 VIEW  Comparison: 05/30/2007  Findings: The heart size and mediastinal contours are within normal limits.  Both lungs are clear.  The visualized skeletal structures are unremarkable.  IMPRESSION: Negative exam.   Original Report Authenticated By: Signa Kell, M.D.    MDM  No diagnosis found.  Pt's SpO2 dropped after neb. Now consistently in 80s despite supplemental O2 and still wheezing. Will check basic labs in anticipation of possible admission, CAT ordered. Will need reassessment after shift change. Care signed out to Dr. Nicanor Alcon pending reassessment.   I personally performed the services described in this documentation, which was scribed in my presence. The recorded information has been reviewed and is accurate.      Charles B. Bernette Mayers, MD 11/26/12 2350

## 2012-11-26 NOTE — ED Notes (Signed)
Patient transported to X-ray 

## 2012-11-26 NOTE — ED Notes (Signed)
Pt oxygen range from 87-91% on RA, MD notified.  Placed patient on 2L [place]

## 2012-11-26 NOTE — ED Notes (Signed)
Pt presents with chest congestion, cough and wheezing x 2 days.  Hx of beginning COPD.

## 2012-11-26 NOTE — ED Notes (Signed)
Pt requesting medication for headache. MD made aware  

## 2012-11-26 NOTE — ED Notes (Signed)
Rt at bedside for neb treatments.

## 2012-11-27 ENCOUNTER — Encounter (HOSPITAL_BASED_OUTPATIENT_CLINIC_OR_DEPARTMENT_OTHER): Payer: Self-pay

## 2012-11-27 ENCOUNTER — Inpatient Hospital Stay (HOSPITAL_COMMUNITY): Payer: Self-pay

## 2012-11-27 DIAGNOSIS — J9600 Acute respiratory failure, unspecified whether with hypoxia or hypercapnia: Principal | ICD-10-CM | POA: Diagnosis present

## 2012-11-27 DIAGNOSIS — J449 Chronic obstructive pulmonary disease, unspecified: Secondary | ICD-10-CM

## 2012-11-27 LAB — D-DIMER, QUANTITATIVE: D-Dimer, Quant: 1 ug/mL-FEU — ABNORMAL HIGH (ref 0.00–0.48)

## 2012-11-27 LAB — CBC WITH DIFFERENTIAL/PLATELET
Basophils Absolute: 0 10*3/uL (ref 0.0–0.1)
Basophils Relative: 0 % (ref 0–1)
Eosinophils Absolute: 0 10*3/uL (ref 0.0–0.7)
Eosinophils Relative: 0 % (ref 0–5)
HCT: 42.3 % (ref 36.0–46.0)
MCHC: 36.2 g/dL — ABNORMAL HIGH (ref 30.0–36.0)
MCV: 89.6 fL (ref 78.0–100.0)
Monocytes Absolute: 0.1 10*3/uL (ref 0.1–1.0)
Platelets: 124 10*3/uL — ABNORMAL LOW (ref 150–400)
RDW: 12.3 % (ref 11.5–15.5)

## 2012-11-27 LAB — TSH: TSH: 0.423 u[IU]/mL (ref 0.350–4.500)

## 2012-11-27 LAB — POCT I-STAT 3, ART BLOOD GAS (G3+)
O2 Saturation: 91 %
Patient temperature: 98.6
TCO2: 29 mmol/L (ref 0–100)
pCO2 arterial: 43.5 mmHg (ref 35.0–45.0)
pH, Arterial: 7.419 (ref 7.350–7.450)

## 2012-11-27 LAB — GLUCOSE, CAPILLARY
Glucose-Capillary: 142 mg/dL — ABNORMAL HIGH (ref 70–99)
Glucose-Capillary: 149 mg/dL — ABNORMAL HIGH (ref 70–99)

## 2012-11-27 LAB — BASIC METABOLIC PANEL
BUN: 6 mg/dL (ref 6–23)
CO2: 26 mEq/L (ref 19–32)
Calcium: 9.6 mg/dL (ref 8.4–10.5)
Chloride: 96 mEq/L (ref 96–112)
Creatinine, Ser: 0.61 mg/dL (ref 0.50–1.10)

## 2012-11-27 LAB — HEMOGLOBIN A1C: Mean Plasma Glucose: 111 mg/dL (ref <117)

## 2012-11-27 LAB — RAPID URINE DRUG SCREEN, HOSP PERFORMED
Amphetamines: NOT DETECTED
Cocaine: NOT DETECTED
Opiates: NOT DETECTED
Tetrahydrocannabinol: POSITIVE — AB

## 2012-11-27 LAB — MRSA PCR SCREENING: MRSA by PCR: NEGATIVE

## 2012-11-27 MED ORDER — DEXTROSE 5 % IV SOLN
500.0000 mg | Freq: Once | INTRAVENOUS | Status: AC
  Administered 2012-11-27: 500 mg via INTRAVENOUS
  Filled 2012-11-27 (×2): qty 500

## 2012-11-27 MED ORDER — LEVALBUTEROL HCL 0.63 MG/3ML IN NEBU
0.6300 mg | INHALATION_SOLUTION | Freq: Four times a day (QID) | RESPIRATORY_TRACT | Status: DC
  Administered 2012-11-27: 0.63 mg via RESPIRATORY_TRACT
  Filled 2012-11-27 (×5): qty 3

## 2012-11-27 MED ORDER — ACETAMINOPHEN 325 MG PO TABS
650.0000 mg | ORAL_TABLET | Freq: Four times a day (QID) | ORAL | Status: DC | PRN
  Administered 2012-11-27 – 2012-11-29 (×3): 650 mg via ORAL
  Filled 2012-11-27 (×3): qty 2

## 2012-11-27 MED ORDER — ONDANSETRON HCL 4 MG/2ML IJ SOLN
4.0000 mg | Freq: Four times a day (QID) | INTRAMUSCULAR | Status: DC | PRN
  Administered 2012-11-28: 4 mg via INTRAVENOUS
  Filled 2012-11-27: qty 2

## 2012-11-27 MED ORDER — METHYLPREDNISOLONE SODIUM SUCC 40 MG IJ SOLR
40.0000 mg | Freq: Two times a day (BID) | INTRAMUSCULAR | Status: DC
  Administered 2012-11-27 – 2012-11-29 (×5): 40 mg via INTRAVENOUS
  Filled 2012-11-27 (×7): qty 1

## 2012-11-27 MED ORDER — BENZONATATE 100 MG PO CAPS
200.0000 mg | ORAL_CAPSULE | Freq: Three times a day (TID) | ORAL | Status: DC
  Administered 2012-11-27 – 2012-12-01 (×13): 200 mg via ORAL
  Filled 2012-11-27 (×16): qty 2

## 2012-11-27 MED ORDER — LEVALBUTEROL HCL 0.63 MG/3ML IN NEBU: 0.6300 mg | INHALATION_SOLUTION | Freq: Four times a day (QID) | RESPIRATORY_TRACT | Status: DC | PRN

## 2012-11-27 MED ORDER — ONDANSETRON HCL 4 MG PO TABS: 4.0000 mg | ORAL_TABLET | Freq: Four times a day (QID) | ORAL | Status: DC | PRN

## 2012-11-27 MED ORDER — INFLUENZA VAC SPLIT QUAD 0.5 ML IM SUSP
0.5000 mL | INTRAMUSCULAR | Status: AC
  Administered 2012-11-29: 0.5 mL via INTRAMUSCULAR
  Filled 2012-11-27 (×2): qty 0.5

## 2012-11-27 MED ORDER — LEVOFLOXACIN IN D5W 750 MG/150ML IV SOLN
750.0000 mg | Freq: Every day | INTRAVENOUS | Status: DC
  Administered 2012-11-27 – 2012-11-28 (×2): 750 mg via INTRAVENOUS
  Filled 2012-11-27 (×2): qty 150

## 2012-11-27 MED ORDER — HYDRALAZINE HCL 20 MG/ML IJ SOLN: 10.0000 mg | INTRAMUSCULAR | Status: DC | PRN

## 2012-11-27 MED ORDER — ENOXAPARIN SODIUM 40 MG/0.4ML ~~LOC~~ SOLN
40.0000 mg | SUBCUTANEOUS | Status: DC
  Administered 2012-11-27 – 2012-12-01 (×5): 40 mg via SUBCUTANEOUS
  Filled 2012-11-27 (×5): qty 0.4

## 2012-11-27 MED ORDER — ALBUTEROL SULFATE (5 MG/ML) 0.5% IN NEBU: 2.5000 mg | INHALATION_SOLUTION | RESPIRATORY_TRACT | Status: DC

## 2012-11-27 MED ORDER — METHADONE HCL 10 MG PO TABS
80.0000 mg | ORAL_TABLET | Freq: Every day | ORAL | Status: DC
  Administered 2012-11-27 – 2012-11-30 (×4): 80 mg via ORAL
  Filled 2012-11-27 (×4): qty 8

## 2012-11-27 MED ORDER — DEXTROSE 5 % IV SOLN
1.0000 g | Freq: Once | INTRAVENOUS | Status: AC
  Administered 2012-11-27: 1 g via INTRAVENOUS
  Filled 2012-11-27: qty 10

## 2012-11-27 MED ORDER — INSULIN ASPART 100 UNIT/ML ~~LOC~~ SOLN
0.0000 [IU] | Freq: Three times a day (TID) | SUBCUTANEOUS | Status: DC
  Administered 2012-11-27 – 2012-11-28 (×2): 1 [IU] via SUBCUTANEOUS

## 2012-11-27 MED ORDER — SODIUM CHLORIDE 0.9 % IJ SOLN: 3.0000 mL | Freq: Two times a day (BID) | INTRAMUSCULAR | Status: DC

## 2012-11-27 MED ORDER — GUAIFENESIN-DM 100-10 MG/5ML PO SYRP
5.0000 mL | ORAL_SOLUTION | ORAL | Status: DC | PRN
  Administered 2012-11-28: 5 mL via ORAL
  Filled 2012-11-27: qty 5

## 2012-11-27 MED ORDER — ALBUTEROL SULFATE (5 MG/ML) 0.5% IN NEBU
2.5000 mg | INHALATION_SOLUTION | RESPIRATORY_TRACT | Status: DC | PRN
  Administered 2012-11-29: 2.5 mg via RESPIRATORY_TRACT
  Filled 2012-11-27: qty 0.5

## 2012-11-27 MED ORDER — ALBUTEROL SULFATE (5 MG/ML) 0.5% IN NEBU
2.5000 mg | INHALATION_SOLUTION | Freq: Four times a day (QID) | RESPIRATORY_TRACT | Status: DC
  Administered 2012-11-27 – 2012-12-01 (×14): 2.5 mg via RESPIRATORY_TRACT
  Filled 2012-11-27 (×15): qty 0.5

## 2012-11-27 MED ORDER — IOHEXOL 350 MG/ML SOLN
100.0000 mL | Freq: Once | INTRAVENOUS | Status: AC | PRN
  Administered 2012-11-27: 70 mL via INTRAVENOUS

## 2012-11-27 MED ORDER — IPRATROPIUM BROMIDE 0.02 % IN SOLN
0.5000 mg | Freq: Four times a day (QID) | RESPIRATORY_TRACT | Status: DC
  Administered 2012-11-27 – 2012-12-01 (×15): 0.5 mg via RESPIRATORY_TRACT
  Filled 2012-11-27 (×16): qty 2.5

## 2012-11-27 MED ORDER — INSULIN ASPART 100 UNIT/ML ~~LOC~~ SOLN: 0.0000 [IU] | Freq: Three times a day (TID) | SUBCUTANEOUS | Status: DC

## 2012-11-27 MED ORDER — BUDESONIDE 0.25 MG/2ML IN SUSP
0.2500 mg | Freq: Two times a day (BID) | RESPIRATORY_TRACT | Status: DC
  Administered 2012-11-27 – 2012-11-28 (×3): 0.25 mg via RESPIRATORY_TRACT
  Filled 2012-11-27 (×7): qty 2

## 2012-11-27 MED ORDER — SODIUM CHLORIDE 0.9 % IJ SOLN
3.0000 mL | Freq: Two times a day (BID) | INTRAMUSCULAR | Status: DC
  Administered 2012-11-27 – 2012-11-30 (×8): 3 mL via INTRAVENOUS

## 2012-11-27 MED ORDER — ACETAMINOPHEN 650 MG RE SUPP: 650.0000 mg | Freq: Four times a day (QID) | RECTAL | Status: DC | PRN

## 2012-11-27 MED ORDER — NICOTINE 21 MG/24HR TD PT24
21.0000 mg | MEDICATED_PATCH | Freq: Every day | TRANSDERMAL | Status: DC
  Administered 2012-11-27 – 2012-12-01 (×5): 21 mg via TRANSDERMAL
  Filled 2012-11-27 (×5): qty 1

## 2012-11-27 NOTE — Progress Notes (Signed)
[facility] PROGRESS NOTE  Mallory Medina WUJ:[id number] DOB: 1965/08/01 DOA: 11/26/2012 PCP: No primary provider on file.  Assessment/Plan  Acute hypoxic respiratory failure secondary to acute COPD exacerbation.  ProBNP 290. D-dimer mildly elevated at CT Angiocath negative for pulmonary embolism.  CT chest demonstrated a mild right upper lobe groundglass opacity that could represent atelectasis versus mild pneumonia. She also had a mild T3 compression deformity without canal compromise.  She was started on IV Solu-Medrol twice daily, DuoNeb, inhaled budesonide. She initially required 6 L of nasal cannula to maintain oxygen saturation in the low 90s, however she quickly improved and at the time of my exam has been weaned to 2 L nasal cannula. -  Continue IV Solu-Medrol today, and transition to oral prednisone tomorrow -  Continue levofloxacin and DuoNeb -  Transferred to telemetry -  Wean oxygen as tolerated  Cigarette abuse and dependence with withdrawal -  Nicotine patch -  Counseled cessation  DM with hyperglycemia, likely due to steroids. -  Hemoglobin A1c pending -  Sliding scale insulin  Narcotic dependence on [facility]. -  Verified methadone dose of 80 mg daily with [facility] this morning -  Continue methadone daily -  Urine drug screen also positive for THC, counseled cessation  Elevated blood pressures, possibly secondary to steroids -  Trend -  Add when necessary hydralazine  Diet:  Diabetic Access:  PIV IVF:  Off Proph:  Lovenox  Code Status: Full Family Communication: Patient alone Disposition Plan: Pending improvement in dyspnea   Consultants:  None  Procedures:  CT Angio  Antibiotics:  Levofloxacin   HPI/Subjective:    Objective: Filed Vitals:   11/27/12 0441 11/27/12 0800 11/27/12 0829 11/27/12 0957  BP:  140/70  145/78  Pulse:  76  73  Temp: 98.7 F (37.1 C)  97.5 F (36.4 C) 98.7 F (37.1 C)  TempSrc: Oral   Oral Oral  Resp:  16    Height:      Weight:      SpO2:  95%  96%    Intake/Output Summary (Last 24 hours) at 11/27/12 1230 Last data filed at 11/27/12 0800  Gross per 24 hour  Intake    150 ml  Output   1400 ml  Net  -1250 ml   Filed Weights   11/26/12 2142 11/27/12 0235  Weight: 74.844 kg (165 lb) 73.2 kg (161 lb 6 oz)    Exam:   General:  Caucasian female, No acute distress  HEENT:  NCAT, MMM, nasal cannula in place  Cardiovascular:  RRR, nl S1, S2 no mrg, 2+ pulses, warm extremities  Respiratory:  No expiratory wheeze with diminished breath sounds at the bases, mildly rhonchorous, no focal rales, no increased WOB  Abdomen:   NABS, soft, NT/ND  MSK:   Normal tone and bulk, no LEE  Neuro:  Grossly intact  Data Reviewed: Basic Metabolic Panel:  Recent Labs Lab 11/26/12 2322 11/27/12 0430  NA 136 135  K 3.1* 3.8  CL 95* 96  CO2 31 26  GLUCOSE 137* 246*  BUN 6 6  CREATININE 0.70 0.61  CALCIUM 9.8 9.6   Liver Function Tests: No results found for this basename: AST, ALT, ALKPHOS, BILITOT, PROT, ALBUMIN,  in the last 168 hours No results found for this basename: LIPASE, AMYLASE,  in the last 168 hours No results found for this basename: AMMONIA,  in the last 168 hours CBC:  Recent Labs Lab 11/26/12 2322 11/27/12 0430  WBC 8.4 6.4  NEUTROABS 5.3 5.6  HGB 15.6* 15.3*  HCT 44.7 42.3  MCV 90.9 89.6  PLT 125* 124*   Cardiac Enzymes: No results found for this basename: CKTOTAL, CKMB, CKMBINDEX, TROPONINI,  in the last 168 hours BNP (last 3 results)  Recent Labs  11/27/12 0430  PROBNP 290.0*   CBG:  Recent Labs Lab 11/27/12 0827 11/27/12 1125  GLUCAP 168* 142*    Recent Results (from the past 240 hour(s))  MRSA PCR SCREENING     Status: None   Collection Time    11/27/12  2:39 AM      Result Value Range Status   MRSA by PCR NEGATIVE  NEGATIVE Final   Comment:            The GeneXpert MRSA Assay (FDA     approved for NASAL specimens      only), is one component of a     comprehensive MRSA colonization     surveillance program. It is not     intended to diagnose MRSA     infection nor to guide or     monitor treatment for     MRSA infections.     Studies: Dg Chest 2 View  11/26/2012   *RADIOLOGY REPORT*  Clinical Data: Shortness of breath and wheezing  CHEST - 2 VIEW  Comparison: 05/30/2007  Findings: The heart size and mediastinal contours are within normal limits.  Both lungs are clear.  The visualized skeletal structures are unremarkable.  IMPRESSION: Negative exam.   Original Report Authenticated By: Signa Kell, M.D.   Ct Angio Chest Pe W/cm &/or Wo Cm  11/27/2012   CLINICAL DATA:  Shortness of Breath. Productive cough. Rule out pulmonary embolism. History of COPD.  EXAM: CT ANGIOGRAPHY CHEST WITH CONTRAST  TECHNIQUE: Multidetector CT imaging of the chest was performed using the standard protocol during bolus administration of intravenous contrast. Multiplanar CT image reconstructions including MIPs were obtained to evaluate the vascular anatomy.  CONTRAST:  70mL OMNIPAQUE IOHEXOL 350 MG/ML SOLN  COMPARISON:  Plain films, including 11/26/2012. No prior CT.  FINDINGS: Lung windows demonstrate mild bibasilar scarring. Patchy mild ground-glass opacity within the medial right upper lobe, including on images 30 -31/series 6. .  Soft tissue windows: The quality of this exam for evaluation of pulmonary embolism is moderate. A moderate amount of contrast remains in the SVC. No evidence of pulmonary embolism.  Normal thoracic aorta, without evidence of dissection. Normal heart size, without pericardial or pleural effusion.  No mediastinal or hilar adenopathy.  Limited abdominal imaging demonstrates no significant findings.  Mild T3 superior endplate compression deformity, of indeterminate acuity. No canal compromise.  Review of the MIP images confirms the above findings.  IMPRESSION: 1. No evidence of pulmonary embolism. 2. Minimal  right upper lobe ground-glass opacity. Although this could represent subsegmental atelectasis, given the clinical history, suspicious for mild infection. 3. Mild T3 compression deformity, without significant canal compromise.   Electronically Signed   By: Jeronimo Greaves   On: 11/27/2012 07:47    Scheduled Meds: . albuterol  2.5 mg Nebulization Q6H  . benzonatate  200 mg Oral TID  . budesonide (PULMICORT) nebulizer solution  0.25 mg Nebulization BID  . enoxaparin (LOVENOX) injection  40 mg Subcutaneous Q24H  . [START ON 11/28/2012] influenza vac split quadrivalent PF  0.5 mL Intramuscular Tomorrow-1000  . insulin aspart  0-9 Units Subcutaneous TID WC  . ipratropium  0.5 mg Nebulization Q6H  . levofloxacin (LEVAQUIN)  IV  750 mg Intravenous Daily  . methadone  80 mg Oral Daily  . methylPREDNISolone (SOLU-MEDROL) injection  40 mg Intravenous Q12H  . nicotine  21 mg Transdermal Daily  . sodium chloride  3 mL Intravenous Q12H   Continuous Infusions:   Principal Problem:   Acute respiratory failure    Time spent: 30 min    Colletta Spillers, [facility]  [facility] Pager [phone]. If 7PM-7AM, please contact night-coverage at [email or web address], password [facility] 11/27/2012, 12:30 PM  LOS: 1 day

## 2012-11-27 NOTE — Progress Notes (Signed)
Clinical Social Work Department BRIEF PSYCHOSOCIAL ASSESSMENT 11/27/2012  Patient:  Mallory Medina, Mallory Medina     Account Number:  [id number]     Admit date:  11/26/2012  Clinical Social Worker:  Carren Rang  Date/Time:  11/27/2012 02:11 PM  Referred by:  CSW  Date Referred:  11/27/2012 Referred for  Homelessness   Other Referral:   Interview type:  Patient Other interview type:    PSYCHOSOCIAL DATA Living Status:   Admitted from facility:   Level of care:   Primary support name:  Harriett Sine (middle daughter) [phone] Primary support relationship to patient:  CHILD, ADULT Degree of support available:   Poor    CURRENT CONCERNS Current Concerns  Other - See comment   Other Concerns:   Homeless Concerns    SOCIAL WORK ASSESSMENT / PLAN CSW received referral for Homeless Concerns. CSW went into room and introduced self to patient. CSW explained reason for visit. Patient stated that she "is going to be very blunt" with CSW. Patient stated that she is homeless right now but is able to get by. She stated that she has a motel room to live in every night because she gets money by being a prostitute. 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 Patient stated that [place] said she can live with her until she gets back on her feet. Patient asked CSW if she can call daughter Harriett Sine to see about housing options. CSW will follow up with daughter and assist with options.   Assessment/plan status:  Information/Referral to [facility] Other assessment/ plan:   Information/referral to community resources:   List of shelters/ Resources    PATIENT'S/FAMILY'S RESPONSE TO PLAN OF CARE: Patient states she would rather not live in a shelter and is able to stay in a motel about every  night. Patient asked CSW to follow up with daughter about her current situation.       Maree Krabbe, MSW, [facility] [phone]

## 2012-11-27 NOTE — Progress Notes (Signed)
[facility] PROGRESS NOTE  Mallory Medina  UJW:[id number]  DOB: 1965/09/08  DOA: 11/26/2012 PCP: No primary provider on file.  HPI/Subjective: Called to accept the patient for transfer from med [facility]. The patient presented with complain of shortness of breath and cough, initially was on room air but progressively requiring 5 L of oxygen to maintain adequate saturations.  Continues to wheeze as per the ER physician.  Data Reviewed: Chest x-ray  Assessment and Plan: Patient accepted for transfer to step down unit. Requested an ABG due to continuous progressive hypoxia. Also requested to start the patient on ceftriaxone and Zithromax.  Author: Lynden Oxford, MD [facility] Pager: [phone] 11/27/2012 2:57 AM   If 7PM-7AM, please contact night-coverage at [email or web address], password [facility]

## 2012-11-27 NOTE — Progress Notes (Addendum)
Pt to be TX, [facility] Admin methadone prior to TX. Called report.  Also called RT so she can be seen on 2W.

## 2012-11-27 NOTE — H&P (Addendum)
[facility] History and Physical  Mallory Medina JWJ:[id number] DOB: Jul 24, 1965 DOA: 11/26/2012  Referring physician: ER physician. Patient was transferred from [facility]. PCP: No primary provider on file.   Chief Complaint: Shortness of breath.  HPI: Mallory Medina is a 47 y.o. female history of tobacco abuse and chronic pain has been experiencing shortness of breath last few weeks but worse in last 2 days with productive cough. Patient states that she gets short of breath easily even at rest and has been having productive cough with subjective feeling of fever chills and diaphoresis. Denies any chest pain. Patient also had a fall last week but did not lose consciousness. In the ER patient was found to be hypoxic and has required at least 5 L of oxygen to maintain saturation. Patient at this time has been admitted for acute hypoxic respiratory failure secondary to severe bronchitis. On my exam patient presently is comfortable and able to complete sentences without difficulty. Patient is wheezing. Patient otherwise denies any nausea vomiting abdominal pain or any focal deficits.  Review of Systems: As presented in the history of presenting illness, rest negative.  Past Medical History  Diagnosis Date  . COPD (chronic obstructive pulmonary disease)   . Diabetes mellitus without complication   . Hypertension   . Back pain, chronic   . IV drug abuse recovery    hx of   Past Surgical History  Procedure Laterality Date  . Back surgery    . External fixation arm Left   . Knee arthroscopy Bilateral   . Tonsillectomy    . Tubal ligation     Social History:  reports that she has been smoking Cigarettes.  She has been smoking about 1.00 pack per day. She does not have any smokeless tobacco history on file. She reports that she does not drink alcohol or use illicit drugs. Home. where does patient live-- Yes. Can patient participate in ADLs?  Allergies  Allergen Reactions  .  Sulfa Antibiotics Nausea And Vomiting    Stomach ache    Family History  Problem Relation Age of Onset  . Diabetes Mellitus II Mother       Prior to Admission medications   Medication Sig Start Date End Date Taking? Authorizing Provider  methadone (DOLOPHINE) 10 MG tablet Take 10 mg by mouth every 8 (eight) hours. Pt goes to methadone clinic and takes 80mg    Yes Historical Provider, MD  cyclobenzaprine (FLEXERIL) 10 MG tablet Take 1 tablet (10 mg total) by mouth 2 (two) times daily as needed for muscle spasms. 01/20/12   Flint Melter, MD  dimenhyDRINATE (DRAMAMINE) 50 MG tablet Take 50 mg by mouth every 8 (eight) hours as needed. For nausea    Historical Provider, MD  oxyCODONE-acetaminophen (PERCOCET/ROXICET) 5-325 MG per tablet Take 1 tablet by mouth every 4 (four) hours as needed for pain. 01/20/12   Flint Melter, MD   Physical Exam: Filed Vitals:   11/26/12 2345 11/27/12 0015 11/27/12 0059 11/27/12 0235  BP:   151/80 134/67  Pulse: 78 85 98 89  Temp:   98.8 F (37.1 C) 98.6 F (37 C)  TempSrc:   Oral Oral  Resp:   18 19  Height:    5\' 4"  (1.626 m)  Weight:    73.2 kg (161 lb 6 oz)  SpO2: 92% 91% 95% 100%     General:  Well-developed and nourished.  Eyes: Anicteric no pallor.  ENT: No discharge from ears eyes nose  mouth.  Neck: No mass felt.  Cardiovascular: S1-S2 heard.  Respiratory: Bilateral expiratory wheeze heard. No crepitations.  Abdomen: Soft nontender bowel sounds present.  Skin: No rash.  Musculoskeletal: No edema.  Psychiatric: Appears normal.  Neurologic: Alert awake oriented to time place and person. Moves all extremities.  Labs on Admission:  Basic Metabolic Panel:  Recent Labs Lab 11/26/12 2322  NA 136  K 3.1*  CL 95*  CO2 31  GLUCOSE 137*  BUN 6  CREATININE 0.70  CALCIUM 9.8   Liver Function Tests: No results found for this basename: AST, ALT, ALKPHOS, BILITOT, PROT, ALBUMIN,  in the last 168 hours No results found for this  basename: LIPASE, AMYLASE,  in the last 168 hours No results found for this basename: AMMONIA,  in the last 168 hours CBC:  Recent Labs Lab 11/26/12 2322  WBC 8.4  NEUTROABS 5.3  HGB 15.6*  HCT 44.7  MCV 90.9  PLT 125*   Cardiac Enzymes: No results found for this basename: CKTOTAL, CKMB, CKMBINDEX, TROPONINI,  in the last 168 hours  BNP (last 3 results) No results found for this basename: PROBNP,  in the last 8760 hours CBG: No results found for this basename: GLUCAP,  in the last 168 hours  Radiological Exams on Admission: Dg Chest 2 View  11/26/2012   *RADIOLOGY REPORT*  Clinical Data: Shortness of breath and wheezing  CHEST - 2 VIEW  Comparison: 05/30/2007  Findings: The heart size and mediastinal contours are within normal limits.  Both lungs are clear.  The visualized skeletal structures are unremarkable.  IMPRESSION: Negative exam.   Original Report Authenticated By: Signa Kell, M.D.     Assessment/Plan Principal Problem:   Acute respiratory failure   1. Acute gross respiratory failure secondary to severe bronchitis - continue with nebulizer Pulmicort and steroids and antibiotics. Check BNP and d-dimer. 2. Tobacco abuse - patient states that she is planning to quit and has not smoked last 2 days. 3. Hyperglycemia - probably from steroids. Check hemoglobin A1c. Place patient on sliding-scale coverage. 4. Chronic pain - continue methadone. Drug screen pending.    Code Status: Full code.  Family Communication: None.  Disposition Plan: Admit to inpatient.    Sanders Manninen N. [facility] Pager [phone].  If 7PM-7AM, please contact night-coverage [email or web address] Password [facility] 11/27/2012, 3:42 AM

## 2012-11-27 NOTE — Progress Notes (Signed)
Nutrition Brief Note  Patient identified on the Malnutrition Screening Tool (MST) Report for minor weight loss. Patient unsure of usual weight.  Wt Readings from Last 15 Encounters:  11/27/12 161 lb 6 oz (73.2 kg)    Body mass index is 27.69 kg/(m^2). Patient meets criteria for overweight based on current BMI.   Current diet order is regular, patient is consuming 100% of meals at this time. Labs and medications reviewed.   No nutrition interventions warranted at this time. If nutrition issues arise, please consult RD.   Joaquin Courts, RD, LDN, CNSC Pager [phone] After Hours Pager [phone]

## 2012-11-28 DIAGNOSIS — J441 Chronic obstructive pulmonary disease with (acute) exacerbation: Secondary | ICD-10-CM

## 2012-11-28 DIAGNOSIS — D696 Thrombocytopenia, unspecified: Secondary | ICD-10-CM

## 2012-11-28 DIAGNOSIS — F17200 Nicotine dependence, unspecified, uncomplicated: Secondary | ICD-10-CM

## 2012-11-28 DIAGNOSIS — R030 Elevated blood-pressure reading, without diagnosis of hypertension: Secondary | ICD-10-CM

## 2012-11-28 DIAGNOSIS — R7309 Other abnormal glucose: Secondary | ICD-10-CM

## 2012-11-28 DIAGNOSIS — R739 Hyperglycemia, unspecified: Secondary | ICD-10-CM

## 2012-11-28 DIAGNOSIS — F1120 Opioid dependence, uncomplicated: Secondary | ICD-10-CM

## 2012-11-28 DIAGNOSIS — F1920 Other psychoactive substance dependence, uncomplicated: Secondary | ICD-10-CM

## 2012-11-28 DIAGNOSIS — F17213 Nicotine dependence, cigarettes, with withdrawal: Secondary | ICD-10-CM

## 2012-11-28 DIAGNOSIS — J189 Pneumonia, unspecified organism: Secondary | ICD-10-CM

## 2012-11-28 LAB — GLUCOSE, CAPILLARY

## 2012-11-28 MED ORDER — GUAIFENESIN-CODEINE 100-10 MG/5ML PO SOLN
5.0000 mL | ORAL | Status: DC | PRN
  Administered 2012-11-29: 5 mL via ORAL
  Filled 2012-11-28: qty 5

## 2012-11-28 MED ORDER — DOCUSATE SODIUM 100 MG PO CAPS
100.0000 mg | ORAL_CAPSULE | Freq: Two times a day (BID) | ORAL | Status: DC
  Administered 2012-11-28 – 2012-12-01 (×5): 100 mg via ORAL
  Filled 2012-11-28 (×6): qty 1

## 2012-11-28 MED ORDER — LEVOFLOXACIN 750 MG PO TABS
750.0000 mg | ORAL_TABLET | Freq: Every day | ORAL | Status: DC
  Administered 2012-11-29 – 2012-12-01 (×3): 750 mg via ORAL
  Filled 2012-11-28 (×3): qty 1

## 2012-11-28 MED ORDER — BISACODYL 5 MG PO TBEC: 5.0000 mg | DELAYED_RELEASE_TABLET | Freq: Every day | ORAL | Status: DC | PRN

## 2012-11-28 MED ORDER — SENNA 8.6 MG PO TABS
2.0000 | ORAL_TABLET | Freq: Every day | ORAL | Status: DC
  Administered 2012-11-28 – 2012-12-01 (×4): 17.2 mg via ORAL
  Filled 2012-11-28 (×4): qty 2

## 2012-11-28 MED ORDER — POLYETHYLENE GLYCOL 3350 17 G PO PACK
17.0000 g | PACK | Freq: Every day | ORAL | Status: DC | PRN
  Administered 2012-11-29: 17 g via ORAL
  Filled 2012-11-28 (×2): qty 1

## 2012-11-28 NOTE — Care Management Note (Signed)
    Page 1 of 2   12/01/2012     1:27:33 PM   CARE MANAGEMENT NOTE 12/01/2012  Patient:  Mallory Medina, Mallory Medina   Account Number:  [id number]  Date Initiated:  11/28/2012  Documentation initiated by:  Dylan Monforte  Subjective/Objective Assessment:   PT ADM WITH RESPIRATORY FAILURE ON 11/26/12.  PTA, PT LIVES ALONE.  SHE IS ESSENTIALLY HOMELESS, AND LIVING IN [place].     Action/Plan:   CSW FOLLOWING FOR SOCIAL ISSUES.  MAY BE ELIGIBLE FOR MATCH PROGRAM FOR MEDICATION ASSISTANCE AT DC.  PT HAS NO PCP.   Anticipated DC Date:  11/30/2012   Anticipated DC Plan:  HOME/SELF CARE      DC Planning Services  CM consult  Medication Assistance  MATCH Program  PCP issues      Choice offered to / List presented to:     DME arranged  NEBULIZER MACHINE      DME agency  [facility]        Status of service:  Completed, signed off Medicare Important Message given?   (If response is "NO", the following Medicare IM given date fields will be blank) Date Medicare IM given:   Date Additional Medicare IM given:    Discharge Disposition:  HOME/SELF CARE  Per UR Regulation:  Reviewed for med. necessity/level of care/duration of stay  If discussed at Long Length of Stay Meetings, dates discussed:    Comments:  12/01/12 Pelham Hennick,RN,BSN [phone] PT FOR DC TODAY.  PT ELIGIBLE FOR MATCH PROGRAM; LETTER GIVEN WITH EXPLANATION OF PROGRAM BENEFITS.  NEBULIZER MACHINE DELIVERED TO PT'S ROOM PRIOR TO DC.  PT DID NOT QUALIFY FOR HOME OXYGEN; SATS 90% OR BETTER WITH AMBULATION.  PT TO FOLLOW UP AT [facility] ON 12/12/12 AT 11:30, AS SHE HAS NO PCP.  11/28/12 Nishi Neiswonger,RN,BSN [phone] PT NOW SLEEPING, AND WOULD NOT AWAKEN WITH CALLING NAME. CASE MANAGER CONT TO ATTEMPT FOLLOW UP.  11/28/12 Myia Bergh,RN,BSN [phone]  1330 ATTEMPTED TO MEET WITH PT TO DISCUSS DISCHARGE NEEDS; RECEIVING BREATHING TREATMENT.  WILL FOLLOW UP LATER.

## 2012-11-28 NOTE — Progress Notes (Signed)
PHARMACY IV TO PO  This patient is receiving the antibiotic levofloxacin by the intravenous route. Based on criteria approved by the Pharmacy and Therapeutics Committee, and the Infectious Disease Division, the antibiotic(s) is / are being converted to equivalent oral dose form(s). These criteria include:  . Patient being treated for a respiratory tract infection,  . The patient is not neutropenic and does not exhibit a GI malabsorption state . The patient is eating (either orally or per tube) and/or has been taking other orally administered medications for at least 24 hours. . The patient is improving clinically (physician assessment and a 24-hour Tmax of ? 100.5? F).  If you have questions about this conversion, please contact the pharmacy department.    Thank you for allowing pharmacy to be a part of this patients care team.  Lovenia Kim Pharm.D., BCPS Clinical Pharmacist 11/28/2012 1:45 PM Pager: [phone] Phone: [phone]

## 2012-11-28 NOTE — Progress Notes (Signed)
Spoke with pt at length concerning discharge and plans.  She says her daughter wants her to live with her in [place] but she does not want to move there.  She wants to stay in [place] where her doctors are and not have to set up somewhere else.  She would like to get any assistance possible "to get her life together". Pt would like for daughter to speak to her about her condition. Will continue to monitor. Thomas Hoff

## 2012-11-28 NOTE — Progress Notes (Signed)
[facility] PROGRESS NOTE  Mallory Medina ZOX:[id number] DOB: 1965/05/12 DOA: 11/26/2012 PCP: No primary provider on file.  Assessment/Plan  Acute hypoxic respiratory failure secondary to acute COPD exacerbation.  ProBNP 290. D-dimer mildly elevated at CT Angiocath negative for pulmonary embolism.  CT chest demonstrated a mild right upper lobe groundglass opacity that could represent atelectasis versus mild pneumonia. She also had a mild T3 compression deformity without canal compromise.  She was started on IV Solu-Medrol twice daily, DuoNeb, inhaled budesonide. She initially required 6 L of nasal cannula to maintain oxygen saturation in the low 90s, however she quickly improved and at the time of my exam has been weaned to 2 L nasal cannula. -  Slow prednisone taper -  Continue levofloxacin day 2  -  Continue DuoNebs -  Wean oxygen as tolerated  Cigarette abuse and dependence with withdrawal -  Nicotine patch -  Counseled cessation  Former DM with hyperglycemia, likely due to steroids. -  Hemoglobin A1c 5.5 -  D/c Sliding scale insulin and CBG -  Wean steroids  Narcotic dependence [facility]. -  Continue methadone 80 mg daily -  Urine drug screen also positive for THC, counseled cessation  Elevated blood pressures, possibly secondary to steroids and trending down to normal. -  Cont prn hydralazine  Diet:  Diabetic Access:  PIV IVF:  Off Proph:  Lovenox  Code Status: Full Family Communication: Patient alone Disposition Plan: Pending improvement in dyspnea   Consultants:  None  Procedures:  CT Angio  Antibiotics:  Levofloxacin 9/9 >>  HPI/Subjective:    Objective: Filed Vitals:   11/27/12 1325 11/27/12 1415 11/27/12 2131 11/28/12 0425  BP: 164/83  136/78 124/76  Pulse: 80  98 82  Temp: 98.2 F (36.8 C)  98.5 F (36.9 C) 97.3 F (36.3 C)  TempSrc: Oral  Oral Oral  Resp: 18  18 18   Height:      Weight:      SpO2: 98% 99%  95% 98%    Intake/Output Summary (Last 24 hours) at 11/28/12 1050 Last data filed at 11/28/12 0900  Gross per 24 hour  Intake    360 ml  Output   1000 ml  Net   -640 ml   Filed Weights   11/26/12 2142 11/27/12 0235  Weight: 74.844 kg (165 lb) 73.2 kg (161 lb 6 oz)    Exam:   General:  Caucasian female, No acute distress  HEENT:  NCAT, MMM, nasal cannula in place  Cardiovascular:  RRR, nl S1, S2 no mrg, 2+ pulses, warm extremities  Respiratory:  Inspiratory and expiratory wheezes with diminished breath sounds at the bases, mildly rhonchorous, no focal rales, no increased WOB  Abdomen:   NABS, soft, NT/ND  MSK:   Normal tone and bulk, no LEE  Neuro:  Grossly intact  Data Reviewed: Basic Metabolic Panel:  Recent Labs Lab 11/26/12 2322 11/27/12 0430  NA 136 135  K 3.1* 3.8  CL 95* 96  CO2 31 26  GLUCOSE 137* 246*  BUN 6 6  CREATININE 0.70 0.61  CALCIUM 9.8 9.6   Liver Function Tests: No results found for this basename: AST, ALT, ALKPHOS, BILITOT, PROT, ALBUMIN,  in the last 168 hours No results found for this basename: LIPASE, AMYLASE,  in the last 168 hours No results found for this basename: AMMONIA,  in the last 168 hours CBC:  Recent Labs Lab 11/26/12 2322 11/27/12 0430  WBC 8.4 6.4  NEUTROABS 5.3  5.6  HGB 15.6* 15.3*  HCT 44.7 42.3  MCV 90.9 89.6  PLT 125* 124*   Cardiac Enzymes: No results found for this basename: CKTOTAL, CKMB, CKMBINDEX, TROPONINI,  in the last 168 hours BNP (last 3 results)  Recent Labs  11/27/12 0430  PROBNP 290.0*   CBG:  Recent Labs Lab 11/27/12 0827 11/27/12 1125 11/27/12 1624 11/27/12 2129 11/28/12 0600  GLUCAP 168* 142* 105* 149* 142*    Recent Results (from the past 240 hour(s))  MRSA PCR SCREENING     Status: None   Collection Time    11/27/12  2:39 AM      Result Value Range Status   MRSA by PCR NEGATIVE  NEGATIVE Final   Comment:            The GeneXpert MRSA Assay (FDA     approved for NASAL  specimens     only), is one component of a     comprehensive MRSA colonization     surveillance program. It is not     intended to diagnose MRSA     infection nor to guide or     monitor treatment for     MRSA infections.     Studies: Dg Chest 2 View  11/26/2012   *RADIOLOGY REPORT*  Clinical Data: Shortness of breath and wheezing  CHEST - 2 VIEW  Comparison: 05/30/2007  Findings: The heart size and mediastinal contours are within normal limits.  Both lungs are clear.  The visualized skeletal structures are unremarkable.  IMPRESSION: Negative exam.   Original Report Authenticated By: Signa Kell, M.D.   Ct Angio Chest Pe W/cm &/or Wo Cm  11/27/2012   CLINICAL DATA:  Shortness of Breath. Productive cough. Rule out pulmonary embolism. History of COPD.  EXAM: CT ANGIOGRAPHY CHEST WITH CONTRAST  TECHNIQUE: Multidetector CT imaging of the chest was performed using the standard protocol during bolus administration of intravenous contrast. Multiplanar CT image reconstructions including MIPs were obtained to evaluate the vascular anatomy.  CONTRAST:  70mL OMNIPAQUE IOHEXOL 350 MG/ML SOLN  COMPARISON:  Plain films, including 11/26/2012. No prior CT.  FINDINGS: Lung windows demonstrate mild bibasilar scarring. Patchy mild ground-glass opacity within the medial right upper lobe, including on images 30 -31/series 6. .  Soft tissue windows: The quality of this exam for evaluation of pulmonary embolism is moderate. A moderate amount of contrast remains in the SVC. No evidence of pulmonary embolism.  Normal thoracic aorta, without evidence of dissection. Normal heart size, without pericardial or pleural effusion.  No mediastinal or hilar adenopathy.  Limited abdominal imaging demonstrates no significant findings.  Mild T3 superior endplate compression deformity, of indeterminate acuity. No canal compromise.  Review of the MIP images confirms the above findings.  IMPRESSION: 1. No evidence of pulmonary embolism. 2.  Minimal right upper lobe ground-glass opacity. Although this could represent subsegmental atelectasis, given the clinical history, suspicious for mild infection. 3. Mild T3 compression deformity, without significant canal compromise.   Electronically Signed   By: Jeronimo Greaves   On: 11/27/2012 07:47    Scheduled Meds: . albuterol  2.5 mg Nebulization Q6H  . benzonatate  200 mg Oral TID  . budesonide (PULMICORT) nebulizer solution  0.25 mg Nebulization BID  . docusate sodium  100 mg Oral BID  . enoxaparin (LOVENOX) injection  40 mg Subcutaneous Q24H  . influenza vac split quadrivalent PF  0.5 mL Intramuscular Tomorrow-1000  . insulin aspart  0-9 Units Subcutaneous TID WC  . ipratropium  0.5 mg Nebulization Q6H  . levofloxacin (LEVAQUIN) IV  750 mg Intravenous Daily  . methadone  80 mg Oral Daily  . methylPREDNISolone (SOLU-MEDROL) injection  40 mg Intravenous Q12H  . nicotine  21 mg Transdermal Daily  . senna  2 tablet Oral Daily  . sodium chloride  3 mL Intravenous Q12H   Continuous Infusions:   Principal Problem:   Acute respiratory failure    Time spent: 30 min    Mallory Medina, [facility]  [facility] Pager [phone]. If 7PM-7AM, please contact night-coverage at [email or web address], password [facility] 11/28/2012, 10:50 AM  LOS: 2 days

## 2012-11-28 NOTE — Progress Notes (Signed)
CSW spoke with daughter Harriett Sine who currently resides in [place]. Daughter stated that she can accommodate her mom with housing in [place]. Daughter also stated that she would like to see if there is any way the hospital can transport the patient to [facility] and if not daughter did state that she could come pick up her mom. CSW will update when new information arises.  Maree Krabbe, MSW, [facility] Majors [phone]

## 2012-11-29 LAB — GLUCOSE, CAPILLARY: Glucose-Capillary: 100 mg/dL — ABNORMAL HIGH (ref 70–99)

## 2012-11-29 MED ORDER — BUDESONIDE 0.5 MG/2ML IN SUSP
0.5000 mg | Freq: Two times a day (BID) | RESPIRATORY_TRACT | Status: DC
  Administered 2012-11-29 – 2012-12-01 (×4): 0.5 mg via RESPIRATORY_TRACT
  Filled 2012-11-29 (×6): qty 2

## 2012-11-29 MED ORDER — FAMOTIDINE 40 MG PO TABS
40.0000 mg | ORAL_TABLET | Freq: Every day | ORAL | Status: DC
  Administered 2012-11-29 – 2012-12-01 (×3): 40 mg via ORAL
  Filled 2012-11-29 (×3): qty 1

## 2012-11-29 MED ORDER — OXYMETAZOLINE HCL 0.05 % NA SOLN
1.0000 | Freq: Two times a day (BID) | NASAL | Status: DC
  Administered 2012-11-29 – 2012-12-01 (×5): 1 via NASAL
  Filled 2012-11-29: qty 15

## 2012-11-29 MED ORDER — FLUTICASONE PROPIONATE 50 MCG/ACT NA SUSP
2.0000 | Freq: Every day | NASAL | Status: DC
  Administered 2012-11-29 – 2012-12-01 (×3): 2 via NASAL
  Filled 2012-11-29: qty 16

## 2012-11-29 MED ORDER — NAPROXEN 500 MG PO TABS
500.0000 mg | ORAL_TABLET | Freq: Two times a day (BID) | ORAL | Status: DC | PRN
  Administered 2012-11-29 – 2012-11-30 (×2): 500 mg via ORAL
  Filled 2012-11-29 (×2): qty 1

## 2012-11-29 MED ORDER — PREDNISONE 50 MG PO TABS
60.0000 mg | ORAL_TABLET | Freq: Every day | ORAL | Status: DC
  Administered 2012-11-30 – 2012-12-01 (×2): 60 mg via ORAL
  Filled 2012-11-29 (×3): qty 1

## 2012-11-29 MED ORDER — GUAIFENESIN-DM 100-10 MG/5ML PO SYRP
15.0000 mL | ORAL_SOLUTION | Freq: Four times a day (QID) | ORAL | Status: DC | PRN
  Administered 2012-11-29 – 2012-11-30 (×3): 15 mL via ORAL
  Filled 2012-11-29 (×5): qty 15

## 2012-11-29 MED ORDER — ALUM & MAG HYDROXIDE-SIMETH 200-200-20 MG/5ML PO SUSP
15.0000 mL | ORAL | Status: DC | PRN
Start: 2012-11-29 — End: 2012-12-01
  Administered 2012-11-29: 15 mL via ORAL
  Filled 2012-11-29: qty 30

## 2012-11-29 NOTE — Progress Notes (Signed)
Pt resting oxygen sats on Room Air were 90 %. Pt ambulated in hallway 350 ft with a few standing breaks on room air. Pt short of breath with activity and coughing spells during ambulation. Sats while ambulating were 87-88 % on room air. Pt assisted back to bed and placed back to 2 liters of oxygen. Sats on oxygen were 96%. Will continue to monitor.

## 2012-11-29 NOTE — Progress Notes (Signed)
[facility] PROGRESS NOTE  Mallory Medina WUJ:[id number] DOB: [birth year] DOA: 11/26/2012 PCP: No primary provider on file.  Assessment/Plan  Acute hypoxic respiratory failure secondary to acute COPD exacerbation.  ProBNP 290. D-dimer mildly elevated at CT Angiocath negative for pulmonary embolism.  CT chest demonstrated a mild right upper lobe groundglass opacity that could represent atelectasis versus mild pneumonia.  She also had a mild T3 compression deformity without canal compromise.  She was started on IV Solu-Medrol twice daily, DuoNeb, inhaled budesonide. She initially required 6 L of nasal cannula to maintain oxygen saturation in the low 90s, however she quickly improved and at the time of my exam has been weaned to 2 L nasal cannula. -  Slow prednisone taper -  Continue levofloxacin day 3 -  Continue DuoNebs -  Wean oxygen and check walking pulse ox today  Cigarette abuse and dependence with withdrawal -  Nicotine patch -  Counseled cessation  Former DM with hyperglycemia, likely due to steroids. -  Hemoglobin A1c 5.5 -  D/c Sliding scale insulin and CBG -  Wean steroids  Narcotic dependence [facility]. -  Continue methadone 80 mg daily -  Urine drug screen also positive for THC, counseled cessation  Elevated blood pressures, possibly secondary to steroids and trending down to normal. -  Cont prn hydralazine  Diet:  Diabetic Access:  PIV IVF:  Off Proph:  Lovenox  Code Status: Full Family Communication: Patient alone Disposition Plan: Pending improvement in dyspnea, possibly home tomorrow   Consultants:  None  Procedures:  CT Angio  Antibiotics:  Levofloxacin 9/9 >>  HPI/Subjective:  Patient states that she is not eating well, severe sinus congestion, cough persists. No BMs, voiding well.  Feels somewhat better.    Objective: Filed Vitals:   11/28/12 1357 11/28/12 2115 11/29/12 0508 11/29/12 0510  BP: 141/70 103/66  152/74 152/74  Pulse: 75 91 76 68  Temp: 98.3 F (36.8 C) 98.1 F (36.7 C) 98.3 F (36.8 C) 98.3 F (36.8 C)  TempSrc: Oral Oral Oral Oral  Resp: 18 18  18   Height:      Weight:      SpO2: 96% 90%  96%    Intake/Output Summary (Last 24 hours) at 11/29/12 0800 Last data filed at 11/28/12 2300  Gross per 24 hour  Intake   1080 ml  Output      0 ml  Net   1080 ml   Filed Weights   11/26/12 2142 11/27/12 0235  Weight: 74.844 kg (165 lb) 73.2 kg (161 lb 6 oz)    Exam:   General:  Caucasian female, No acute distress  HEENT:  NCAT, MMM, on RA  Cardiovascular:  RRR, nl S1, S2 no mrg, 2+ pulses, warm extremities  Respiratory:  Full expiratory wheezes with better aeration, mildly rhonchorous, no focal rales, no increased WOB  Abdomen:   NABS, soft, NT/ND  MSK:   Normal tone and bulk, no LEE  Neuro:  Grossly intact  Data Reviewed: Basic Metabolic Panel:  Recent Labs Lab 11/26/12 2322 11/27/12 0430  NA 136 135  K 3.1* 3.8  CL 95* 96  CO2 31 26  GLUCOSE 137* 246*  BUN 6 6  CREATININE 0.70 0.61  CALCIUM 9.8 9.6   Liver Function Tests: No results found for this basename: AST, ALT, ALKPHOS, BILITOT, PROT, ALBUMIN,  in the last 168 hours No results found for this basename: LIPASE, AMYLASE,  in the last 168 hours No results  found for this basename: AMMONIA,  in the last 168 hours CBC:  Recent Labs Lab 11/26/12 2322 11/27/12 0430  WBC 8.4 6.4  NEUTROABS 5.3 5.6  HGB 15.6* 15.3*  HCT 44.7 42.3  MCV 90.9 89.6  PLT 125* 124*   Cardiac Enzymes: No results found for this basename: CKTOTAL, CKMB, CKMBINDEX, TROPONINI,  in the last 168 hours BNP (last 3 results)  Recent Labs  11/27/12 0430  PROBNP 290.0*   CBG:  Recent Labs Lab 11/27/12 0827 11/27/12 1125 11/27/12 1624 11/27/12 2129 11/28/12 0600  GLUCAP 168* 142* 105* 149* 142*    Recent Results (from the past 240 hour(s))  MRSA PCR SCREENING     Status: None   Collection Time    11/27/12   2:39 AM      Result Value Range Status   MRSA by PCR NEGATIVE  NEGATIVE Final   Comment:            The GeneXpert MRSA Assay (FDA     approved for NASAL specimens     only), is one component of a     comprehensive MRSA colonization     surveillance program. It is not     intended to diagnose MRSA     infection nor to guide or     monitor treatment for     MRSA infections.     Studies: No results found.  Scheduled Meds: . albuterol  2.5 mg Nebulization Q6H  . benzonatate  200 mg Oral TID  . budesonide (PULMICORT) nebulizer solution  0.25 mg Nebulization BID  . docusate sodium  100 mg Oral BID  . enoxaparin (LOVENOX) injection  40 mg Subcutaneous Q24H  . fluticasone  2 spray Each Nare Daily  . influenza vac split quadrivalent PF  0.5 mL Intramuscular Tomorrow-1000  . ipratropium  0.5 mg Nebulization Q6H  . levofloxacin  750 mg Oral Daily  . methadone  80 mg Oral Daily  . methylPREDNISolone (SOLU-MEDROL) injection  40 mg Intravenous Q12H  . nicotine  21 mg Transdermal Daily  . oxymetazoline  1 spray Each Nare BID  . senna  2 tablet Oral Daily  . sodium chloride  3 mL Intravenous Q12H   Continuous Infusions:   Principal Problem:   Acute respiratory failure Active Problems:   Hyperglycemia   Elevated blood pressure   COPD with acute exacerbation   CAP (community acquired pneumonia)   Thrombocytopenia, unspecified   Cigarette nicotine dependence with withdrawal   Narcotic dependence    Time spent: 30 min    Mallory Medina  [facility] Pager [phone]. If 7PM-7AM, please contact night-coverage at [email or web address], password [facility] 11/29/2012, 8:00 AM  LOS: 3 days

## 2012-11-30 DIAGNOSIS — F411 Generalized anxiety disorder: Secondary | ICD-10-CM

## 2012-11-30 DIAGNOSIS — K5900 Constipation, unspecified: Secondary | ICD-10-CM

## 2012-11-30 MED ORDER — DULOXETINE HCL 20 MG PO CPEP
20.0000 mg | ORAL_CAPSULE | Freq: Every day | ORAL | Status: DC
  Administered 2012-11-30 – 2012-12-01 (×2): 20 mg via ORAL
  Filled 2012-11-30 (×4): qty 1

## 2012-11-30 MED ORDER — METHADONE HCL 10 MG PO TABS
80.0000 mg | ORAL_TABLET | Freq: Every day | ORAL | Status: DC
  Administered 2012-12-01: 80 mg via ORAL
  Filled 2012-11-30: qty 8

## 2012-11-30 NOTE — Progress Notes (Addendum)
[facility] PROGRESS NOTE  Mallory Medina ZOX:[id number] DOB: 1965/08/06 DOA: 11/26/2012 PCP: No primary provider on file.  Assessment/Plan  Acute hypoxic respiratory failure secondary to acute COPD exacerbation.  ProBNP 290. D-dimer mildly elevated at CT Angiocath negative for pulmonary embolism.  CT chest demonstrated a mild right upper lobe groundglass opacity that could represent atelectasis versus mild pneumonia.  She also had a mild T3 compression deformity without canal compromise.  She was started on IV Solu-Medrol twice daily, DuoNeb, inhaled budesonide. She initially required 6 L of nasal cannula to maintain oxygen saturation in the low 90s. -  Slow prednisone taper -  Continue levofloxacin day 4 -  Continue DuoNebs -  Wean oxygen and check walking pulse ox today -  Continue guaifenesin with dextromethorphan, Tessalon  Cigarette abuse and dependence with withdrawal -  Nicotine patch -  Counseled cessation  Former DM with hyperglycemia, likely due to steroids. 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 -  Continue methadone 80 mg daily -  Urine drug screen also positive for THC, counseled cessation  Elevated blood pressures, possibly secondary to steroids and trending down to normal. -  Cont prn hydralazine  Anxiety: -  Patient would like medication for anxiety, however I talked her about the fact that most immediate acting anxiety medications are addictive. She previously tolerated Cymbalta well in the past so I will restart this at 20 mg daily. This may be titrated up as needed as an outpatient..  Diet:  Diabetic Access:  PIV IVF:  Off Proph:  Lovenox  Code Status: Full Family Communication: Patient alone Disposition Plan: Pending improvement in dyspnea, possibly home in a few days   Consultants:  None  Procedures:  CT Angio  Antibiotics:  Levofloxacin 9/9 >>  HPI/Subjective:  Patient states  that she is eating well, persistent severe sinus congestion, cough persists. No BMs, voiding well.  Feels somewhat better though.  Walked in the hall better than when she was at home just prior to coming to ER.    Objective: Filed Vitals:   11/29/12 1436 11/29/12 1948 11/29/12 2026 11/30/12 0531  BP: 156/90 127/78  133/86  Pulse: 84 77  73  Temp: 98.5 F (36.9 C) 98.4 F (36.9 C)  98 F (36.7 C)  TempSrc: Oral Oral  Oral  Resp: 18 18  18   Height:      Weight:      SpO2: 98% 95% 94% 96%    Intake/Output Summary (Last 24 hours) at 11/30/12 1021 Last data filed at 11/29/12 2200  Gross per 24 hour  Intake    720 ml  Output      0 ml  Net    720 ml   Filed Weights   11/26/12 2142 11/27/12 0235  Weight: 74.844 kg (165 lb) 73.2 kg (161 lb 6 oz)    Exam:   General:  Caucasian female, No acute distress, frequent cough.  HEENT:  NCAT, MMM, on RA  Cardiovascular:  RRR, nl S1, S2 no mrg, 2+ pulses, warm extremities  Respiratory:  Full expiratory wheezes with better aeration, mildly rhonchorous, no focal rales, no increased WOB  Abdomen:   NABS, soft, NT/ND  MSK:   Normal tone and bulk, no LEE  Neuro:  Grossly intact  Data Reviewed: Basic Metabolic Panel:  Recent Labs Lab 11/26/12 2322 11/27/12 0430  NA 136 135  K 3.1* 3.8  CL 95* 96  CO2 31 26  GLUCOSE 137* 246*  BUN 6 6  CREATININE 0.70 0.61  CALCIUM 9.8 9.6   Liver Function Tests: No results found for this basename: AST, ALT, ALKPHOS, BILITOT, PROT, ALBUMIN,  in the last 168 hours No results found for this basename: LIPASE, AMYLASE,  in the last 168 hours No results found for this basename: AMMONIA,  in the last 168 hours CBC:  Recent Labs Lab 11/26/12 2322 11/27/12 0430  WBC 8.4 6.4  NEUTROABS 5.3 5.6  HGB 15.6* 15.3*  HCT 44.7 42.3  MCV 90.9 89.6  PLT 125* 124*   Cardiac Enzymes: No results found for this basename: CKTOTAL, CKMB, CKMBINDEX, TROPONINI,  in the last 168 hours BNP (last 3  results)  Recent Labs  11/27/12 0430  PROBNP 290.0*   CBG:  Recent Labs Lab 11/27/12 1624 11/27/12 2129 11/28/12 0600 11/29/12 2132 11/30/12 0607  GLUCAP 105* 149* 142* 100* 96    Recent Results (from the past 240 hour(s))  MRSA PCR SCREENING     Status: None   Collection Time    11/27/12  2:39 AM      Result Value Range Status   MRSA by PCR NEGATIVE  NEGATIVE Final   Comment:            The GeneXpert MRSA Assay (FDA     approved for NASAL specimens     only), is one component of a     comprehensive MRSA colonization     surveillance program. It is not     intended to diagnose MRSA     infection nor to guide or     monitor treatment for     MRSA infections.     Studies: No results found.  Scheduled Meds: . albuterol  2.5 mg Nebulization Q6H  . benzonatate  200 mg Oral TID  . budesonide (PULMICORT) nebulizer solution  0.5 mg Nebulization BID  . docusate sodium  100 mg Oral BID  . enoxaparin (LOVENOX) injection  40 mg Subcutaneous Q24H  . famotidine  40 mg Oral Daily  . fluticasone  2 spray Each Nare Daily  . ipratropium  0.5 mg Nebulization Q6H  . levofloxacin  750 mg Oral Daily  . methadone  80 mg Oral Daily  . nicotine  21 mg Transdermal Daily  . oxymetazoline  1 spray Each Nare BID  . predniSONE  60 mg Oral Q breakfast  . senna  2 tablet Oral Daily  . sodium chloride  3 mL Intravenous Q12H   Continuous Infusions:   Principal Problem:   Acute respiratory failure Active Problems:   Hyperglycemia   Elevated blood pressure   COPD with acute exacerbation   CAP (community acquired pneumonia)   Thrombocytopenia, unspecified   Cigarette nicotine dependence with withdrawal   Narcotic dependence    Time spent: 30 min    Johnpaul Gillentine  [facility] Pager [phone]. If 7PM-7AM, please contact night-coverage at [email or web address], password [facility] 11/30/2012, 10:21 AM  LOS: 4 days              and is in a to the legs were seizure in the

## 2012-12-01 DIAGNOSIS — J441 Chronic obstructive pulmonary disease with (acute) exacerbation: Secondary | ICD-10-CM

## 2012-12-01 MED ORDER — BECLOMETHASONE DIPROPIONATE 40 MCG/ACT IN AERS: 2.0000 | INHALATION_SPRAY | Freq: Two times a day (BID) | RESPIRATORY_TRACT | Status: AC

## 2012-12-01 MED ORDER — IPRATROPIUM BROMIDE 0.02 % IN SOLN: 500.0000 ug | Freq: Four times a day (QID) | RESPIRATORY_TRACT | Status: AC

## 2012-12-01 MED ORDER — DULOXETINE HCL 20 MG PO CPEP: 20.0000 mg | ORAL_CAPSULE | Freq: Every day | ORAL | Status: AC

## 2012-12-01 MED ORDER — PREDNISONE 10 MG PO TABS: ORAL_TABLET | ORAL | Status: DC

## 2012-12-01 MED ORDER — GUAIFENESIN-DM 100-10 MG/5ML PO SYRP: 15.0000 mL | ORAL_SOLUTION | Freq: Four times a day (QID) | ORAL | Status: DC | PRN

## 2012-12-01 MED ORDER — ALBUTEROL SULFATE (2.5 MG/3ML) 0.083% IN NEBU: 2.5000 mg | INHALATION_SOLUTION | Freq: Four times a day (QID) | RESPIRATORY_TRACT | Status: AC | PRN

## 2012-12-01 MED ORDER — NICOTINE 21 MG/24HR TD PT24: 1.0000 | MEDICATED_PATCH | Freq: Every day | TRANSDERMAL | Status: DC

## 2012-12-01 MED ORDER — BENZONATATE 200 MG PO CAPS: 200.0000 mg | ORAL_CAPSULE | Freq: Three times a day (TID) | ORAL | Status: DC

## 2012-12-01 MED ORDER — OXYMETAZOLINE HCL 0.05 % NA SOLN: 1.0000 | Freq: Two times a day (BID) | NASAL | Status: DC | PRN

## 2012-12-01 MED ORDER — ALBUTEROL SULFATE HFA 108 (90 BASE) MCG/ACT IN AERS: 2.0000 | INHALATION_SPRAY | Freq: Four times a day (QID) | RESPIRATORY_TRACT | Status: AC | PRN

## 2012-12-01 NOTE — Discharge Summary (Addendum)
Physician Discharge Summary  Mallory Medina VWU:[id number] DOB: 12-30-1965 DOA: 11/26/2012  PCP: No primary provider on file.    Admit date: 11/26/2012 Discharge date: 12/01/2012  Recommendations for Outpatient Follow-up:  1. Home nebulizer machine with medications 2. Home health RN and aid 3. Continue methadone program as before 4. Antibiotics, steroids, nebulizer equipment, controller inhalers  5. Primary care doctor within one week, consider referral to pulmonology for formal PFTs when patient is improved.  Please check blood pressure after steroids are tapered.  Followup anxiety and consider titration of Cymbalta as needed.  Discharge Diagnoses:  Principal Problem:   Acute respiratory failure Active Problems:   Hyperglycemia   Elevated blood pressure   COPD with acute exacerbation   CAP (community acquired pneumonia)   Thrombocytopenia, unspecified   Cigarette nicotine dependence with withdrawal   Narcotic dependence   Anxiety state, unspecified   Unspecified constipation   Discharge Condition: stable, improved  Diet recommendation:  Healthy heart  Wt Readings from Last 3 Encounters:  11/27/12 73.2 kg (161 lb 6 oz)    History of present illness:  Mallory Medina is a 47 y.o. female history of tobacco abuse and chronic pain has been experiencing shortness of breath last few weeks but worse in last 2 days with productive cough. Patient states that she gets Mallory Medina of breath easily even at rest and has been having productive cough with subjective feeling of fever chills and diaphoresis. Denies any chest pain. Patient also had a fall last week but did not lose consciousness. In the ER patient was found to be hypoxic and has required at least 5 L of oxygen to maintain saturation. Patient at this time has been admitted for acute hypoxic respiratory failure secondary to severe bronchitis. On my exam patient presently is comfortable and able to complete sentences without difficulty.  Patient is wheezing. Patient otherwise denies any nausea vomiting abdominal pain or any focal deficits.  Hospital Course:  Acute hypoxic respiratory failure secondary to acute COPD exacerbation. ProBNP 290. D-dimer mildly elevated at CT Angiocath negative for pulmonary embolism. CT chest demonstrated a mild right upper lobe groundglass opacity that could represent atelectasis versus mild pneumonia.  She also had a mild T3 compression deformity without canal compromise. She was started on IV Solu-Medrol twice daily, DuoNeb, inhaled budesonide. She initially required 6 L of nasal cannula to maintain oxygen saturation in the low 90s.  She was transitioned to oral prednisone and oral antibiotics which she tolerated well. She continued doing every 4-6 hours and was able to sleep overnight without any breathing treatments without worsening of her respiratory distress on the day of discharge.  She had difficulties with excessive cough and was started on Tessalon and guaifenesin with dextromethorphan.  She should followup with her primary care doctor within one week of discharge for repeat examination and possible referral to pulmonology.  Cigarette abuse and dependence with withdrawal.  She was started on nicotine patch and she had counseling to stop smoking.  Former DM with hyperglycemia, likely due to steroids.  Her hemoglobin A1c was 5.5. I anticipate that her blood glucose will trend down as her steroids are tapered.  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  She continued methadone 80 mg daily. She should followup with her methadone program at discharge.  Urine drug screen also positive for THC.    Elevated blood pressures, possibly secondary to steroids and trending down to normal.  She was not started on any antihypertensives.  Her primary care  doctor can repeat her blood pressure one to 2 weeks to ensure that it is trending down as her steroids are tapered.  Anxiety:  She was started  on Cymbalta 20 mg once daily.  Her primary care doctor may titrate this dose as needed as an outpatient.    Consultants:  None Procedures:  CT Angio Antibiotics:  Levofloxacin 9/9 >> 9/15  Discharge Exam: Filed Vitals:   12/01/12 0623  BP: 154/85  Pulse:   Temp:   Resp:    Filed Vitals:   11/30/12 2340 12/01/12 0402 12/01/12 0623 12/01/12 0728  BP: 142/90 161/111 154/85   Pulse:      Temp:  98.6 F (37 C)    TempSrc:  Oral    Resp:      Height:      Weight:      SpO2:  94%  94%   Patient states that she feels much better. She still has cough and wheeze, however these are improving. She would like to go home today.  She is coughing up gray phlegm.   General: Caucasian female, No acute distress, appears better today HEENT: NCAT, MMM, on RA  Cardiovascular: RRR, nl S1, S2 no mrg, 2+ pulses, warm extremities  Respiratory: Full expiratory wheezes with better aeration, mildly rhonchorous, no focal rales, no increased WOB  Abdomen: NABS, soft, NT/ND  MSK: Normal tone and bulk, no LEE  Neuro: Grossly intact   Discharge Instructions      Discharge Orders   Future Orders Complete By Expires   Call MD for:  difficulty breathing, headache or visual disturbances  As directed    Call MD for:  extreme fatigue  As directed    Call MD for:  hives  As directed    Call MD for:  persistant dizziness or light-headedness  As directed    Call MD for:  persistant nausea and vomiting  As directed    Call MD for:  severe uncontrolled pain  As directed    Call MD for:  temperature >100.4  As directed    Diet - low sodium heart healthy  As directed    Discharge instructions  As directed    Comments:     You were hospitalized with COPD exacerbation and pneumonia. You were started on steroids, breathing treatments, and antibiotics. You have slowly improved.  He has completed a full course of antibiotics. Please continue a slow taper of steroids over the next couple of weeks. Continue your  breathing treatments via the nebulizer machine every 4-6 hours until you followup with your primary care doctor.  Please stop smoking. Quitting smoking will help you improve more quickly and reduce the chance of this happening again. If you have fevers, difficulty breathing, shortness of breath that is worsening, please return to the emergency department.   Increase activity slowly  As directed        Medication List         albuterol (2.5 MG/3ML) 0.083% nebulizer solution  Commonly known as:  PROVENTIL  Take 3 mLs (2.5 mg total) by nebulization every 6 (six) hours as needed for wheezing or shortness of breath.     albuterol 108 (90 BASE) MCG/ACT inhaler  Commonly known as:  PROVENTIL HFA;VENTOLIN HFA  Inhale 2 puffs into the lungs every 6 (six) hours as needed for wheezing.     beclomethasone 40 MCG/ACT inhaler  Commonly known as:  QVAR  Inhale 2 puffs into the lungs 2 (two) times daily.  benzonatate 200 MG capsule  Commonly known as:  TESSALON  Take 1 capsule (200 mg total) by mouth 3 (three) times daily.     DULoxetine 20 MG capsule  Commonly known as:  CYMBALTA  Take 1 capsule (20 mg total) by mouth daily.     guaiFENesin-dextromethorphan 100-10 MG/5ML syrup  Commonly known as:  ROBITUSSIN DM  Take 15 mLs by mouth every 6 (six) hours as needed for cough.     ipratropium 0.02 % nebulizer solution  Commonly known as:  ATROVENT  Take 2.5 mLs (500 mcg total) by nebulization 4 (four) times daily.     METHADONE HCL PO  Take 80 mg by mouth daily. Medication is in liquid form     nicotine 21 mg/24hr patch  Commonly known as:  NICODERM CQ - dosed in mg/24 hours  Place 1 patch onto the skin daily.     oxymetazoline 0.05 % nasal spray  Commonly known as:  AFRIN  Place 1 spray into the nose 2 (two) times daily as needed for congestion (do not use more than 3 consecutive days.).     predniSONE 10 MG tablet  Commonly known as:  DELTASONE  Take 5 tabs daily x 2 days, 4 tabs  daily x 2 days, 3 tabs daily x 2 days, 2 tabs daily x 2 days, 1 tab daily x 2 days, then stop.       Follow-up Information   Follow up with [facility]. Schedule an appointment as soon as possible for a visit in 1 month.   Specialty:  Pulmonology   Contact information:   [address] [place] [address] [phone]      The results of significant diagnostics from this hospitalization (including imaging, microbiology, ancillary and laboratory) are listed below for reference.    Significant Diagnostic Studies: Dg Chest 2 View  11/26/2012   *RADIOLOGY REPORT*  Clinical Data: Shortness of breath and wheezing  CHEST - 2 VIEW  Comparison: 05/30/2007  Findings: The heart size and mediastinal contours are within normal limits.  Both lungs are clear.  The visualized skeletal structures are unremarkable.  IMPRESSION: Negative exam.   Original Report Authenticated By: Signa Kell, M.D.   Ct Angio Chest Pe W/cm &/or Wo Cm  11/27/2012   CLINICAL DATA:  Shortness of Breath. Productive cough. Rule out pulmonary embolism. History of COPD.  EXAM: CT ANGIOGRAPHY CHEST WITH CONTRAST  TECHNIQUE: Multidetector CT imaging of the chest was performed using the standard protocol during bolus administration of intravenous contrast. Multiplanar CT image reconstructions including MIPs were obtained to evaluate the vascular anatomy.  CONTRAST:  70mL OMNIPAQUE IOHEXOL 350 MG/ML SOLN  COMPARISON:  Plain films, including 11/26/2012. No prior CT.  FINDINGS: Lung windows demonstrate mild bibasilar scarring. Patchy mild ground-glass opacity within the medial right upper lobe, including on images 30 -31/series 6. .  Soft tissue windows: The quality of this exam for evaluation of pulmonary embolism is moderate. A moderate amount of contrast remains in the SVC. No evidence of pulmonary embolism.  Normal thoracic aorta, without evidence of dissection. Normal heart size, without pericardial or pleural effusion.  No  mediastinal or hilar adenopathy.  Limited abdominal imaging demonstrates no significant findings.  Mild T3 superior endplate compression deformity, of indeterminate acuity. No canal compromise.  Review of the MIP images confirms the above findings.  IMPRESSION: 1. No evidence of pulmonary embolism. 2. Minimal right upper lobe ground-glass opacity. Although this could represent subsegmental atelectasis, given the clinical  history, suspicious for mild infection. 3. Mild T3 compression deformity, without significant canal compromise.   Electronically Signed   By: Jeronimo Greaves   On: 11/27/2012 07:47    Microbiology: Recent Results (from the past 240 hour(s))  MRSA PCR SCREENING     Status: None   Collection Time    11/27/12  2:39 AM      Result Value Range Status   MRSA by PCR NEGATIVE  NEGATIVE Final   Comment:            The GeneXpert MRSA Assay (FDA     approved for NASAL specimens     only), is one component of a     comprehensive MRSA colonization     surveillance program. It is not     intended to diagnose MRSA     infection nor to guide or     monitor treatment for     MRSA infections.     Labs: Basic Metabolic Panel:  Recent Labs Lab 11/26/12 2322 11/27/12 0430  NA 136 135  K 3.1* 3.8  CL 95* 96  CO2 31 26  GLUCOSE 137* 246*  BUN 6 6  CREATININE 0.70 0.61  CALCIUM 9.8 9.6   Liver Function Tests: No results found for this basename: AST, ALT, ALKPHOS, BILITOT, PROT, ALBUMIN,  in the last 168 hours No results found for this basename: LIPASE, AMYLASE,  in the last 168 hours No results found for this basename: AMMONIA,  in the last 168 hours CBC:  Recent Labs Lab 11/26/12 2322 11/27/12 0430  WBC 8.4 6.4  NEUTROABS 5.3 5.6  HGB 15.6* 15.3*  HCT 44.7 42.3  MCV 90.9 89.6  PLT 125* 124*   Cardiac Enzymes: No results found for this basename: CKTOTAL, CKMB, CKMBINDEX, TROPONINI,  in the last 168 hours BNP: BNP (last 3 results)  Recent Labs  11/27/12 0430   PROBNP 290.0*   CBG:  Recent Labs Lab 11/27/12 1624 11/27/12 2129 11/28/12 0600 11/29/12 2132 11/30/12 0607  GLUCAP 105* 149* 142* 100* 96    Time coordinating discharge: 45 minutes  Signed:  Charne Mcbrien  [facility] 12/01/2012, 8:58 AM

## 2012-12-12 ENCOUNTER — Inpatient Hospital Stay: Payer: Self-pay | Admitting: Family Medicine

## 2012-12-31 ENCOUNTER — Institutional Professional Consult (permissible substitution): Payer: Self-pay | Admitting: Pulmonary Disease

## 2012-12-31 ENCOUNTER — Telehealth: Payer: Self-pay | Admitting: General Practice

## 2012-12-31 NOTE — Telephone Encounter (Signed)
Returning pt's call to reschedule, numbers not valid.

## 2013-01-01 ENCOUNTER — Encounter: Payer: Self-pay | Admitting: Pulmonary Disease

## 2013-06-29 ENCOUNTER — Emergency Department (HOSPITAL_BASED_OUTPATIENT_CLINIC_OR_DEPARTMENT_OTHER)
Admission: EM | Admit: 2013-06-29 | Discharge: 2013-06-29 | Disposition: A | Discharge status: Home / Self Care | Payer: Self-pay | Attending: Emergency Medicine | Admitting: Emergency Medicine

## 2013-06-29 ENCOUNTER — Emergency Department (HOSPITAL_BASED_OUTPATIENT_CLINIC_OR_DEPARTMENT_OTHER): Payer: Self-pay

## 2013-06-29 ENCOUNTER — Encounter (HOSPITAL_BASED_OUTPATIENT_CLINIC_OR_DEPARTMENT_OTHER): Payer: Self-pay | Admitting: Emergency Medicine

## 2013-06-29 DIAGNOSIS — B9689 Other specified bacterial agents as the cause of diseases classified elsewhere: Secondary | ICD-10-CM | POA: Insufficient documentation

## 2013-06-29 DIAGNOSIS — A499 Bacterial infection, unspecified: Secondary | ICD-10-CM | POA: Insufficient documentation

## 2013-06-29 DIAGNOSIS — N390 Urinary tract infection, site not specified: Secondary | ICD-10-CM | POA: Insufficient documentation

## 2013-06-29 DIAGNOSIS — J441 Chronic obstructive pulmonary disease with (acute) exacerbation: Secondary | ICD-10-CM | POA: Insufficient documentation

## 2013-06-29 DIAGNOSIS — F17200 Nicotine dependence, unspecified, uncomplicated: Secondary | ICD-10-CM | POA: Insufficient documentation

## 2013-06-29 DIAGNOSIS — Z79899 Other long term (current) drug therapy: Secondary | ICD-10-CM | POA: Insufficient documentation

## 2013-06-29 DIAGNOSIS — E119 Type 2 diabetes mellitus without complications: Secondary | ICD-10-CM | POA: Insufficient documentation

## 2013-06-29 DIAGNOSIS — N760 Acute vaginitis: Secondary | ICD-10-CM | POA: Insufficient documentation

## 2013-06-29 DIAGNOSIS — G8929 Other chronic pain: Secondary | ICD-10-CM | POA: Insufficient documentation

## 2013-06-29 DIAGNOSIS — IMO0002 Reserved for concepts with insufficient information to code with codable children: Secondary | ICD-10-CM | POA: Insufficient documentation

## 2013-06-29 DIAGNOSIS — I10 Essential (primary) hypertension: Secondary | ICD-10-CM | POA: Insufficient documentation

## 2013-06-29 DIAGNOSIS — Z3202 Encounter for pregnancy test, result negative: Secondary | ICD-10-CM | POA: Insufficient documentation

## 2013-06-29 LAB — URINE MICROSCOPIC-ADD ON

## 2013-06-29 LAB — HIV ANTIBODY (ROUTINE TESTING W REFLEX): HIV: NONREACTIVE

## 2013-06-29 LAB — URINALYSIS, ROUTINE W REFLEX MICROSCOPIC
Bilirubin Urine: NEGATIVE
GLUCOSE, UA: NEGATIVE mg/dL
HGB URINE DIPSTICK: NEGATIVE
KETONES UR: NEGATIVE mg/dL
Nitrite: NEGATIVE
PROTEIN: NEGATIVE mg/dL
Specific Gravity, Urine: 1.017 (ref 1.005–1.030)
UROBILINOGEN UA: 1 mg/dL (ref 0.0–1.0)
pH: 6 (ref 5.0–8.0)

## 2013-06-29 LAB — PREGNANCY, URINE: PREG TEST UR: NEGATIVE

## 2013-06-29 LAB — WET PREP, GENITAL
TRICH WET PREP: NONE SEEN
YEAST WET PREP: NONE SEEN

## 2013-06-29 MED ORDER — IPRATROPIUM-ALBUTEROL 0.5-2.5 (3) MG/3ML IN SOLN
3.0000 mL | Freq: Once | RESPIRATORY_TRACT | Status: AC
  Administered 2013-06-29: 3 mL via RESPIRATORY_TRACT
  Filled 2013-06-29: qty 3

## 2013-06-29 MED ORDER — CLINDAMYCIN HCL 150 MG PO CAPS: 150.0000 mg | ORAL_CAPSULE | Freq: Four times a day (QID) | ORAL | Status: DC

## 2013-06-29 MED ORDER — ALBUTEROL SULFATE (2.5 MG/3ML) 0.083% IN NEBU
2.5000 mg | INHALATION_SOLUTION | Freq: Once | RESPIRATORY_TRACT | Status: AC
  Administered 2013-06-29: 2.5 mg via RESPIRATORY_TRACT
  Filled 2013-06-29: qty 3

## 2013-06-29 MED ORDER — ALBUTEROL SULFATE HFA 108 (90 BASE) MCG/ACT IN AERS
2.0000 | INHALATION_SPRAY | RESPIRATORY_TRACT | Status: DC | PRN
  Administered 2013-06-29: 2 via RESPIRATORY_TRACT
  Filled 2013-06-29: qty 6.7

## 2013-06-29 MED ORDER — NITROFURANTOIN MONOHYD MACRO 100 MG PO CAPS: 100.0000 mg | ORAL_CAPSULE | Freq: Two times a day (BID) | ORAL | Status: DC

## 2013-06-29 MED ORDER — PREDNISONE 50 MG PO TABS
60.0000 mg | ORAL_TABLET | Freq: Once | ORAL | Status: AC
  Administered 2013-06-29: 60 mg via ORAL
  Filled 2013-06-29 (×2): qty 1

## 2013-06-29 MED ORDER — PREDNISONE 10 MG PO TABS: 20.0000 mg | ORAL_TABLET | Freq: Every day | ORAL | Status: DC

## 2013-06-29 NOTE — ED Notes (Signed)
RT & RN at bedside. Patient asked to change into gown.

## 2013-06-29 NOTE — ED Provider Notes (Signed)
CSN: [id number]     Arrival date & time 06/29/13  [id number] History   First MD Initiated Contact with Patient 06/29/13 1003     Chief Complaint  Patient presents with  . Shortness of Breath     (Consider location/radiation/quality/duration/timing/severity/associated sxs/prior Treatment) HPI Comments: Pt states that she hasn't had any of her copd medications because she lives on the street. Pt is also here for infection to her teeth and because she may have been exposed to a std. Pt states that she isn't having symptoms but she wanted to be safe  Patient is a 48 y.o. female presenting with shortness of breath. The history is provided by the patient. No language interpreter was used.  Shortness of Breath Severity:  Moderate Onset quality:  Gradual Timing:  Constant Progression:  Worsening Context: pollens   Relieved by:  Nothing Worsened by:  Nothing tried   Past Medical History  Diagnosis Date  . COPD (chronic obstructive pulmonary disease)   . Diabetes mellitus without complication   . Hypertension   . Back pain, chronic   . IV drug abuse recovery    hx of   Past Surgical History  Procedure Laterality Date  . Back surgery    . External fixation arm Left   . Knee arthroscopy Bilateral   . Tonsillectomy    . Tubal ligation     Family History  Problem Relation Age of Onset  . Diabetes Mellitus II Mother    History  Substance Use Topics  . Smoking status: Current Every Day Smoker -- 1.00 packs/day    Types: Cigarettes  . Smokeless tobacco: Not on file  . Alcohol Use: No   OB History   Grav Para Term Preterm Abortions TAB SAB Ect Mult Living                 Review of Systems  Constitutional: Negative.   Respiratory: Positive for shortness of breath.   Cardiovascular: Negative.       Allergies  Sulfa antibiotics  Home Medications   Current Outpatient Rx  Name  Route  Sig  Dispense  Refill  . albuterol (PROVENTIL HFA;VENTOLIN HFA) 108 (90 BASE) MCG/ACT  inhaler   Inhalation   Inhale 2 puffs into the lungs every 6 (six) hours as needed for wheezing.   2 Inhaler   0   . albuterol (PROVENTIL) (2.5 MG/3ML) 0.083% nebulizer solution   Nebulization   Take 3 mLs (2.5 mg total) by nebulization every 6 (six) hours as needed for wheezing or shortness of breath.   75 mL   0   . beclomethasone (QVAR) 40 MCG/ACT inhaler   Inhalation   Inhale 2 puffs into the lungs 2 (two) times daily.   1 Inhaler   0   . benzonatate (TESSALON) 200 MG capsule   Oral   Take 1 capsule (200 mg total) by mouth 3 (three) times daily.   20 capsule   0   . DULoxetine (CYMBALTA) 20 MG capsule   Oral   Take 1 capsule (20 mg total) by mouth daily.   30 capsule   0   . guaiFENesin-dextromethorphan (ROBITUSSIN DM) 100-10 MG/5ML syrup   Oral   Take 15 mLs by mouth every 6 (six) hours as needed for cough.   118 mL   0   . ipratropium (ATROVENT) 0.02 % nebulizer solution   Nebulization   Take 2.5 mLs (500 mcg total) by nebulization 4 (four) times daily.   75 mL  0   . METHADONE HCL PO   Oral   Take 110 mg by mouth daily. Medication is in liquid form         . nicotine (NICODERM CQ - DOSED IN MG/24 HOURS) 21 mg/24hr patch   Transdermal   Place 1 patch onto the skin daily.   28 patch   0   . oxymetazoline (AFRIN) 0.05 % nasal spray   Nasal   Place 1 spray into the nose 2 (two) times daily as needed for congestion (do not use more than 3 consecutive days.).   30 mL   0   . predniSONE (DELTASONE) 10 MG tablet      Take 5 tabs daily x 2 days, 4 tabs daily x 2 days, 3 tabs daily x 2 days, 2 tabs daily x 2 days, 1 tab daily x 2 days, then stop.   30 tablet   0    BP 135/107  Pulse 72  Temp(Src) 98.5 F (36.9 C) (Oral)  Resp 20  SpO2 96% Physical Exam  Nursing note and vitals reviewed. Constitutional: She is oriented to person, place, and time. She appears well-developed and well-nourished.  HENT:  Head: Normocephalic and atraumatic.  Right  Ear: External ear normal.  Left Ear: External ear normal.  Multiple decay and fracture teeth. Drainage noted from the right gum  Cardiovascular: Normal rate and regular rhythm.   Pulmonary/Chest: She has wheezes.  Abdominal: Soft. Bowel sounds are normal. There is no tenderness.  Genitourinary:  White discharge. No cmt  Musculoskeletal: Normal range of motion.  Neurological: She is alert and oriented to person, place, and time.  Skin: Skin is warm and dry.  Psychiatric: She has a normal mood and affect.    ED Course  Procedures (including critical care time) Labs Review Labs Reviewed  WET PREP, GENITAL - Abnormal; Notable for the following:    Clue Cells Wet Prep HPF POC FEW (*)    WBC, Wet Prep HPF POC FEW (*)    All other components within normal limits  URINALYSIS, ROUTINE W REFLEX MICROSCOPIC - Abnormal; Notable for the following:    Leukocytes, UA SMALL (*)    All other components within normal limits  URINE MICROSCOPIC-ADD ON - Abnormal; Notable for the following:    Squamous Epithelial / LPF FEW (*)    Bacteria, UA FEW (*)    All other components within normal limits  GC/CHLAMYDIA PROBE AMP  PREGNANCY, URINE  HIV ANTIBODY (ROUTINE TESTING)   Imaging Review Dg Chest 2 View  06/29/2013   CLINICAL DATA:  Cough.  EXAM: CHEST  2 VIEW  COMPARISON:  November 26, 2012  FINDINGS: The heart size and mediastinal contours are within normal limits. Stable central bronchitic thickening is noted. No acute pulmonary disease is noted. No pleural effusion or pneumothorax is noted. The visualized skeletal structures are unremarkable.  IMPRESSION: No acute cardiopulmonary abnormality seen. Stable chronic central bronchitic thickening.   Electronically Signed   By: Roque Lias M.D.   On: 06/29/2013 10:51     EKG Interpretation   Date/Time:  Monday June 29 2013 09:56:33 EDT Ventricular Rate:  75 PR Interval:  152 QRS Duration: 82 QT Interval:  394 QTC Calculation: 439 R Axis:    76 Text Interpretation:  Normal sinus rhythm Nonspecific T wave abnormality  Abnormal ECG Confirmed by RAY MD, DANIELLE ([id number]) on 06/29/2013 10:30:02  AM      MDM   Final diagnoses:  UTI (lower urinary tract  infection)  BV (bacterial vaginosis)  COPD exacerbation   Pt feeling better at this time as far as sob. Pt given inhaler here. Will treat dental and bv with clindamycin:will treat uti with macrobid. Std cultures sent   Teressa LowerVrinda Damond Borchers, NP 06/29/13 1216

## 2013-06-29 NOTE — ED Notes (Signed)
Pelvic cart at bedside. 

## 2013-06-29 NOTE — ED Notes (Signed)
Pt reports hx of COPD.  States that since sept, she has been SOB.  Worsening over the past 2 days with coughing, back pain, vomiting.  Denies CP.

## 2013-06-29 NOTE — ED Notes (Signed)
Patient transported to & from X-ray. 

## 2013-06-29 NOTE — ED Provider Notes (Signed)
History/physical exam/procedure(s) were performed by non-physician practitioner and as supervising physician I was immediately available for consultation/collaboration. I have reviewed all notes and am in agreement with care and plan.   Hilario Quarryanielle S Rylyn Ranganathan, MD 06/29/13 [phone]

## 2013-06-29 NOTE — Discharge Instructions (Signed)

## 2013-06-30 LAB — GC/CHLAMYDIA PROBE AMP
CT Probe RNA: POSITIVE — AB
GC Probe RNA: NEGATIVE

## 2013-07-01 NOTE — ED Notes (Signed)
+  Chlamydia Chart sent to EDP office for review.  

## 2013-07-06 ENCOUNTER — Emergency Department (HOSPITAL_BASED_OUTPATIENT_CLINIC_OR_DEPARTMENT_OTHER)
Admission: EM | Admit: 2013-07-06 | Discharge: 2013-07-06 | Disposition: A | Discharge status: Home / Self Care | Payer: Self-pay | Attending: Emergency Medicine | Admitting: Emergency Medicine

## 2013-07-06 ENCOUNTER — Encounter (HOSPITAL_BASED_OUTPATIENT_CLINIC_OR_DEPARTMENT_OTHER): Payer: Self-pay | Admitting: Emergency Medicine

## 2013-07-06 ENCOUNTER — Emergency Department (HOSPITAL_BASED_OUTPATIENT_CLINIC_OR_DEPARTMENT_OTHER): Payer: Self-pay

## 2013-07-06 DIAGNOSIS — Z792 Long term (current) use of antibiotics: Secondary | ICD-10-CM | POA: Insufficient documentation

## 2013-07-06 DIAGNOSIS — E119 Type 2 diabetes mellitus without complications: Secondary | ICD-10-CM | POA: Insufficient documentation

## 2013-07-06 DIAGNOSIS — I10 Essential (primary) hypertension: Secondary | ICD-10-CM | POA: Insufficient documentation

## 2013-07-06 DIAGNOSIS — G8929 Other chronic pain: Secondary | ICD-10-CM | POA: Insufficient documentation

## 2013-07-06 DIAGNOSIS — A749 Chlamydial infection, unspecified: Secondary | ICD-10-CM | POA: Insufficient documentation

## 2013-07-06 DIAGNOSIS — Z79899 Other long term (current) drug therapy: Secondary | ICD-10-CM | POA: Insufficient documentation

## 2013-07-06 DIAGNOSIS — J441 Chronic obstructive pulmonary disease with (acute) exacerbation: Secondary | ICD-10-CM | POA: Insufficient documentation

## 2013-07-06 DIAGNOSIS — F17200 Nicotine dependence, unspecified, uncomplicated: Secondary | ICD-10-CM | POA: Insufficient documentation

## 2013-07-06 DIAGNOSIS — J449 Chronic obstructive pulmonary disease, unspecified: Secondary | ICD-10-CM

## 2013-07-06 DIAGNOSIS — K029 Dental caries, unspecified: Secondary | ICD-10-CM | POA: Insufficient documentation

## 2013-07-06 DIAGNOSIS — IMO0002 Reserved for concepts with insufficient information to code with codable children: Secondary | ICD-10-CM | POA: Insufficient documentation

## 2013-07-06 DIAGNOSIS — K047 Periapical abscess without sinus: Secondary | ICD-10-CM

## 2013-07-06 DIAGNOSIS — K044 Acute apical periodontitis of pulpal origin: Secondary | ICD-10-CM | POA: Insufficient documentation

## 2013-07-06 MED ORDER — ALBUTEROL SULFATE (2.5 MG/3ML) 0.083% IN NEBU
5.0000 mg | INHALATION_SOLUTION | Freq: Once | RESPIRATORY_TRACT | Status: AC
  Administered 2013-07-06: 5 mg via RESPIRATORY_TRACT
  Filled 2013-07-06: qty 6

## 2013-07-06 MED ORDER — PENICILLIN V POTASSIUM 500 MG PO TABS: 500.0000 mg | ORAL_TABLET | Freq: Four times a day (QID) | ORAL | Status: AC

## 2013-07-06 MED ORDER — MAGIC MOUTHWASH W/LIDOCAINE
1.0000 mL | Freq: Once | ORAL | Status: DC
  Filled 2013-07-06: qty 5

## 2013-07-06 MED ORDER — ALBUTEROL SULFATE (2.5 MG/3ML) 0.083% IN NEBU
INHALATION_SOLUTION | RESPIRATORY_TRACT | Status: AC
  Filled 2013-07-06: qty 6

## 2013-07-06 MED ORDER — ALBUTEROL SULFATE HFA 108 (90 BASE) MCG/ACT IN AERS
2.0000 | INHALATION_SPRAY | RESPIRATORY_TRACT | Status: DC | PRN
  Administered 2013-07-06: 2 via RESPIRATORY_TRACT
  Filled 2013-07-06: qty 6.7

## 2013-07-06 MED ORDER — ALBUTEROL SULFATE (2.5 MG/3ML) 0.083% IN NEBU
5.0000 mg | INHALATION_SOLUTION | Freq: Once | RESPIRATORY_TRACT | Status: AC
  Administered 2013-07-06: 5 mg via RESPIRATORY_TRACT

## 2013-07-06 MED ORDER — MAGIC MOUTHWASH W/LIDOCAINE: 1.0000 mL | Freq: Three times a day (TID) | ORAL | Status: DC | PRN

## 2013-07-06 MED ORDER — AZITHROMYCIN 250 MG PO TABS
1000.0000 mg | ORAL_TABLET | Freq: Once | ORAL | Status: AC
  Administered 2013-07-06: 1000 mg via ORAL
  Filled 2013-07-06: qty 4

## 2013-07-06 NOTE — Discharge Instructions (Signed)
Chronic Obstructive Pulmonary Disease  Chronic obstructive pulmonary disease (COPD) is a common lung condition in which airflow from the lungs is limited. COPD is a general term that can be used to describe many different lung problems that limit airflow, including both chronic bronchitis and emphysema.  If you have COPD, your lung function will probably never return to normal, but there are measures you can take to improve lung function and make yourself feel better.   CAUSES   · Smoking (common).    · Exposure to secondhand smoke.    · Genetic problems.  · Chronic inflammatory lung diseases or recurrent infections.  SYMPTOMS   · Shortness of breath, especially with physical activity.    · Deep, persistent (chronic) cough with a large amount of thick mucus.    · Wheezing.    · Rapid breaths (tachypnea).    · Gray or bluish discoloration (cyanosis) of the skin, especially in fingers, toes, or lips.    · Fatigue.    · Weight loss.    · Frequent infections or episodes when breathing symptoms become much worse (exacerbations).    · Chest tightness.  DIAGNOSIS   Your healthcare provider will take a medical history and perform a physical examination to make the initial diagnosis.  Additional tests for COPD may include:   · Lung (pulmonary) function tests.  · Chest X-ray.  · CT scan.  · Blood tests.  TREATMENT   Treatment available to help you feel better when you have COPD include:   · Inhaler and nebulizer medicines. These help manage the symptoms of COPD and make your breathing more comfortable  · Supplemental oxygen. Supplemental oxygen is only helpful if you have a low oxygen level in your blood.    · Exercise and physical activity. These are beneficial for nearly all people with COPD. Some people may also benefit from a pulmonary rehabilitation program.  HOME CARE INSTRUCTIONS   · Take all medicines (inhaled or pills) as directed by your health care provider.  · Only take over-the-counter or prescription medicines  for pain, fever, or discomfort as directed by your health care provider.    · Avoid over-the-counter medicines or cough syrups that dry up your airway (such as antihistamines) and slow down the elimination of secretions unless instructed otherwise by your healthcare provider.    · If you are a smoker, the most important thing that you can do is stop smoking. Continuing to smoke will cause further lung damage and breathing trouble. Ask your health care provider for help with quitting smoking. He or she can direct you to community resources or hospitals that provide support.  · Avoid exposure to irritants such as smoke, chemicals, and fumes that aggravate your breathing.  · Use oxygen therapy and pulmonary rehabilitation if directed by your health care provider. If you require home oxygen therapy, ask your healthcare provider whether you should purchase a pulse oximeter to measure your oxygen level at home.    · Avoid contact with individuals who have a contagious illness.  · Avoid extreme temperature and humidity changes.  · Eat healthy foods. Eating smaller, more frequent meals and resting before meals may help you maintain your strength.  · Stay active, but balance activity with periods of rest. Exercise and physical activity will help you maintain your ability to do things you want to do.  · Preventing infection and hospitalization is very important when you have COPD. Make sure to receive all the vaccines your health care provider recommends, especially the pneumococcal and influenza vaccines. Ask your healthcare provider whether you   in (inhaling) through your nose for 1 second. Then, purse your lips as if you were going to whistle and breathe out (exhale)  through the pursed lips for 2 seconds.   Diaphragmatic breathing. Start by putting one hand on your abdomen just above your waist. Inhale slowly through your nose. The hand on your abdomen should move out. Then purse your lips and exhale slowly. You should be able to feel the hand on your abdomen moving in as you exhale.   Learn and use controlled coughing to clear mucus from your lungs. Controlled coughing is a series of short, progressive coughs. The steps of controlled coughing are:  1. Lean your head slightly forward.  2. Breathe in deeply using diaphragmatic breathing.  3. Try to hold your breath for 3 seconds.  4. Keep your mouth slightly open while coughing twice.  5. Spit any mucus out into a tissue.  6. Rest and repeat the steps once or twice as needed. SEEK MEDICAL CARE IF:   You are coughing up more mucus than usual.   There is a change in the color or thickness of your mucus.   Your breathing is more labored than usual.   Your breathing is faster than usual.  SEEK IMMEDIATE MEDICAL CARE IF:   You have shortness of breath while you are resting.   You have shortness of breath that prevents you from:  Being able to talk.   Performing your usual physical activities.   You have chest pain lasting longer than 5 minutes.   Your skin color is more cyanotic than usual.  You measure low oxygen saturations for longer than 5 minutes with a pulse oximeter. MAKE SURE YOU:   Understand these instructions.  Will watch your condition.  Will get help right away if you are not doing well or get worse. Document Released: 12/13/2004 Document Revised: 12/24/2012 Document Reviewed: 10/30/2012 [facility] Patient Information 2014 [place], [place].  Periodontal Disease Periodontal disease, or gum disease, is a type of oral disease that affects the surrounding and supporting tissues of the teeth. These include the gums (gingivae), ligaments, and tooth socket (alveolar  bone). Periodontal disease can affect one tooth or many teeth. If left untreated, it may lead to tooth loss.  CAUSES The main cause of periodontal disease is dental plaque, which contains harmful bacteria. These bacteria can cause the gums to become inflamed and infected. Further progression of the disease can damage the other supporting tissues.  RISK FACTORS  Diabetes.   Smoking and tobacco use.   Genetics.   Hormonal changes of puberty, menopause, and pregnancy.   Stress.   Clenching or grinding your teeth.   Substance abuse.  Poor nutrition.   Diseases that interfere with the body's immune system.   Certain medicines. SIGNS AND SYMPTOMS  Red or swollen gums.  Bad breath that does not go away.  Gums that have pulled away from the teeth.  Gums that bleed easily.  Permanent teeth that are loose or separating.  Pain when chewing.  Changes in the way your teeth fit together.  Sensitive teeth. DIAGNOSIS  A thorough examination of the periodontal tissues will be done by your dentist. X-rays may be needed. Evaluation of your medical history will be needed to see if there are other factors or underlying conditions that may contribute to the disease. TREATMENT The number and types of treatment will vary depending on the extent of the disease. Treatment may include brushing and flossing only. Further disease progression may  necessitate scaling and root planing or even surgery. The main goal is to control the infection. Good oral hygiene at home is necessary for the success of all types of treatment. HOME CARE INSTRUCTIONS   Practice good oral hygiene. This includes flossing and brushing your teeth every day.   See your dentist regularly, at least 2 times per year.   Stop smoking if you smoke.  Eat a well-balanced diet. SEEK IMMEDIATE DENTAL CARE IF:   You have any signs or symptoms of periodontal disease along with:  Swelling of your face, neck, or  jaw.  Inability to open your mouth.  Severe pain uncontrolled by pain medicine.  You have a fever or persistent symptoms for more than 2 3 days.  You have a fever and your symptoms suddenly get worse. Document Released: 03/08/2003 Document Revised: 11/05/2012 Document Reviewed: 08/12/2012 [facility] Patient Information 2014 [place], [place].

## 2013-07-06 NOTE — ED Provider Notes (Signed)
CSN: [id number]     Arrival date & time 07/06/13  1102 History   First MD Initiated Contact with Patient 07/06/13 1155     Chief Complaint  Patient presents with  . Mouth Lesions     (Consider location/radiation/quality/duration/timing/severity/associated sxs/prior Treatment) Patient is a 48 y.o. female presenting with mouth sores.  Mouth Lesions  Pt with history of COPD and chronic dental infections was seen about a week ago for both of those as well as STD check. She was given neb and prednisone at home as well as Macrobid for UTI and Clindamycin to treat BV and dental infection. She states she took all of those medications but has been getting worse. Still has drainage from gums, states mouth is 'raw' and can't put in her upper dental plate. She had a positive Chlamydia swab at her previous visit, states no one called her about that.   Past Medical History  Diagnosis Date  . COPD (chronic obstructive pulmonary disease)   . Diabetes mellitus without complication   . Hypertension   . Back pain, chronic   . IV drug abuse recovery    hx of   Past Surgical History  Procedure Laterality Date  . Back surgery    . External fixation arm Left   . Knee arthroscopy Bilateral   . Tonsillectomy    . Tubal ligation     Family History  Problem Relation Age of Onset  . Diabetes Mellitus II Mother    History  Substance Use Topics  . Smoking status: Current Every Day Smoker -- 1.00 packs/day    Types: Cigarettes  . Smokeless tobacco: Not on file  . Alcohol Use: No   OB History   Grav Para Term Preterm Abortions TAB SAB Ect Mult Living                 Review of Systems  HENT: Positive for mouth sores.     All other systems reviewed and are negative except as noted in HPI.    Allergies  Sulfa antibiotics  Home Medications   Prior to Admission medications   Medication Sig Start Date End Date Taking? Authorizing Provider  albuterol (PROVENTIL HFA;VENTOLIN HFA) 108 (90 BASE)  MCG/ACT inhaler Inhale 2 puffs into the lungs every 6 (six) hours as needed for wheezing. 12/01/12   Renae FickleMackenzie Short, MD  albuterol (PROVENTIL) (2.5 MG/3ML) 0.083% nebulizer solution Take 3 mLs (2.5 mg total) by nebulization every 6 (six) hours as needed for wheezing or shortness of breath. 12/01/12   Renae FickleMackenzie Short, MD  beclomethasone (QVAR) 40 MCG/ACT inhaler Inhale 2 puffs into the lungs 2 (two) times daily. 12/01/12   Renae FickleMackenzie Short, MD  benzonatate (TESSALON) 200 MG capsule Take 1 capsule (200 mg total) by mouth 3 (three) times daily. 12/01/12   Renae FickleMackenzie Short, MD  clindamycin (CLEOCIN) 150 MG capsule Take 1 capsule (150 mg total) by mouth every 6 (six) hours. 06/29/13   Teressa LowerVrinda Pickering, NP  DULoxetine (CYMBALTA) 20 MG capsule Take 1 capsule (20 mg total) by mouth daily. 12/01/12   Renae FickleMackenzie Short, MD  guaiFENesin-dextromethorphan (ROBITUSSIN DM) 100-10 MG/5ML syrup Take 15 mLs by mouth every 6 (six) hours as needed for cough. 12/01/12   Renae FickleMackenzie Short, MD  ipratropium (ATROVENT) 0.02 % nebulizer solution Take 2.5 mLs (500 mcg total) by nebulization 4 (four) times daily. 12/01/12   Renae FickleMackenzie Short, MD  METHADONE HCL PO Take 100 mg by mouth daily. Medication is in liquid form    Historical Provider, MD  nicotine (NICODERM CQ - DOSED IN MG/24 HOURS) 21 mg/24hr patch Place 1 patch onto the skin daily. 12/01/12   Renae FickleMackenzie Short, MD  nitrofurantoin, macrocrystal-monohydrate, (MACROBID) 100 MG capsule Take 1 capsule (100 mg total) by mouth 2 (two) times daily. 06/29/13   Teressa LowerVrinda Pickering, NP  oxymetazoline (AFRIN) 0.05 % nasal spray Place 1 spray into the nose 2 (two) times daily as needed for congestion (do not use more than 3 consecutive days.). 12/01/12   Renae FickleMackenzie Short, MD  predniSONE (DELTASONE) 10 MG tablet Take 5 tabs daily x 2 days, 4 tabs daily x 2 days, 3 tabs daily x 2 days, 2 tabs daily x 2 days, 1 tab daily x 2 days, then stop. 12/01/12   Renae FickleMackenzie Short, MD  predniSONE (DELTASONE) 10 MG tablet  Take 2 tablets (20 mg total) by mouth daily. 06/29/13   Teressa LowerVrinda Pickering, NP   BP 137/97  Pulse 64  Temp(Src) 98.1 F (36.7 C) (Oral)  Resp 18  Wt 161 lb (73.029 kg)  SpO2 95% Physical Exam  Nursing note and vitals reviewed. Constitutional: She is oriented to person, place, and time. She appears well-developed and well-nourished.  HENT:  Head: Normocephalic and atraumatic.  Extensive dental decay on lower jaw, no teeth on upper, some drainage from several areas of lower similar to described on previous visit  Eyes: EOM are normal. Pupils are equal, round, and reactive to light.  Neck: Normal range of motion. Neck supple.  Cardiovascular: Normal rate, normal heart sounds and intact distal pulses.   Pulmonary/Chest: Effort normal. No respiratory distress. She has wheezes. She has no rales.  Abdominal: Bowel sounds are normal. She exhibits no distension. There is no tenderness.  Musculoskeletal: Normal range of motion. She exhibits no edema and no tenderness.  Neurological: She is alert and oriented to person, place, and time. She has normal strength. No cranial nerve deficit or sensory deficit.  Skin: Skin is warm and dry. No rash noted.  Psychiatric: She has a normal mood and affect.    ED Course  Procedures (including critical care time) Labs Review Labs Reviewed - No data to display  Imaging Review Dg Chest 2 View  07/06/2013   CLINICAL DATA:  Cough, congestion, shortness of breath, history hypertension, diabetes, COPD  EXAM: CHEST  2 VIEW  COMPARISON:  06/29/2013  FINDINGS: Normal heart size, mediastinal contours, and pulmonary vascularity.  Minimal chronic central peribronchial thickening stable.  No acute infiltrate, pleural effusion or pneumothorax.  Bones unremarkable.  IMPRESSION: Minimal chronic bronchitic changes.  No acute abnormalities.   Electronically Signed   By: Ulyses SouthwardMark  Boles M.D.   On: 07/06/2013 12:48     EKG Interpretation None      MDM   Final diagnoses:   COPD (chronic obstructive pulmonary disease)  Dental infection  Chlamydia    Wheezing improved some, treated for Chlamydia. Will give PCN for continued dental infection. Advised to establish with PCP and dentist for long term management of her symptoms. Referred to COPD clinic as well. Cannot afford Nebs, will give HFA.     Charles B. Bernette MayersSheldon, MD 07/06/13 [phone]

## 2013-07-06 NOTE — ED Notes (Signed)
Pt states still having congestion with cough. Pt states out of inhaler, has taken just about all keflex anitbiotics but now states her mouth is so sore and gums are still infected.

## 2013-07-07 NOTE — ED Notes (Signed)
[facility] called to clarify the magic mouthwash prescripted yesterday.  Standard formaluary is benadryl, nystatin, hydrocortisone and lidocaine.  Reveiwed with Dr. Jeraldine LootsLockwood.  Instructions that the pt does not need a steroid, and not to fill the prescription and have pt follow up with their dentist.  [facility] [phone] with instructions not to fill prescription.

## 2014-01-01 ENCOUNTER — Other Ambulatory Visit: Payer: Self-pay

## 2014-08-30 ENCOUNTER — Encounter (HOSPITAL_COMMUNITY): Payer: Self-pay | Admitting: *Deleted

## 2014-08-30 ENCOUNTER — Emergency Department (HOSPITAL_COMMUNITY): Payer: Self-pay

## 2014-08-30 ENCOUNTER — Emergency Department (HOSPITAL_COMMUNITY)
Admission: EM | Admit: 2014-08-30 | Discharge: 2014-08-30 | Disposition: A | Discharge status: Home / Self Care | Payer: Self-pay | Attending: Emergency Medicine | Admitting: Emergency Medicine

## 2014-08-30 DIAGNOSIS — I10 Essential (primary) hypertension: Secondary | ICD-10-CM | POA: Insufficient documentation

## 2014-08-30 DIAGNOSIS — J441 Chronic obstructive pulmonary disease with (acute) exacerbation: Secondary | ICD-10-CM | POA: Insufficient documentation

## 2014-08-30 DIAGNOSIS — Z79899 Other long term (current) drug therapy: Secondary | ICD-10-CM | POA: Insufficient documentation

## 2014-08-30 DIAGNOSIS — Z720 Tobacco use: Secondary | ICD-10-CM | POA: Insufficient documentation

## 2014-08-30 DIAGNOSIS — G8929 Other chronic pain: Secondary | ICD-10-CM | POA: Insufficient documentation

## 2014-08-30 DIAGNOSIS — E119 Type 2 diabetes mellitus without complications: Secondary | ICD-10-CM | POA: Insufficient documentation

## 2014-08-30 DIAGNOSIS — Z7951 Long term (current) use of inhaled steroids: Secondary | ICD-10-CM | POA: Insufficient documentation

## 2014-08-30 LAB — URINALYSIS, ROUTINE W REFLEX MICROSCOPIC
Bilirubin Urine: NEGATIVE
Glucose, UA: NEGATIVE mg/dL
Ketones, ur: NEGATIVE mg/dL
Nitrite: NEGATIVE
PROTEIN: NEGATIVE mg/dL
Specific Gravity, Urine: 1.005 (ref 1.005–1.030)
Urobilinogen, UA: 1 mg/dL (ref 0.0–1.0)
pH: 6.5 (ref 5.0–8.0)

## 2014-08-30 LAB — CBC WITH DIFFERENTIAL/PLATELET
BASOS ABS: 0 10*3/uL (ref 0.0–0.1)
Basophils Relative: 0 % (ref 0–1)
EOS PCT: 4 % (ref 0–5)
Eosinophils Absolute: 0.2 10*3/uL (ref 0.0–0.7)
HCT: 40.9 % (ref 36.0–46.0)
Hemoglobin: 14.3 g/dL (ref 12.0–15.0)
Lymphocytes Relative: 35 % (ref 12–46)
Lymphs Abs: 2.1 10*3/uL (ref 0.7–4.0)
MCH: 32.2 pg (ref 26.0–34.0)
MCHC: 35 g/dL (ref 30.0–36.0)
MCV: 92.1 fL (ref 78.0–100.0)
Monocytes Absolute: 0.7 10*3/uL (ref 0.1–1.0)
Monocytes Relative: 12 % (ref 3–12)
NEUTROS ABS: 3 10*3/uL (ref 1.7–7.7)
Neutrophils Relative %: 49 % (ref 43–77)
PLATELETS: 142 10*3/uL — AB (ref 150–400)
RBC: 4.44 MIL/uL (ref 3.87–5.11)
RDW: 12 % (ref 11.5–15.5)
WBC: 6.1 10*3/uL (ref 4.0–10.5)

## 2014-08-30 LAB — URINE MICROSCOPIC-ADD ON

## 2014-08-30 LAB — BRAIN NATRIURETIC PEPTIDE: B Natriuretic Peptide: 26.8 pg/mL (ref 0.0–100.0)

## 2014-08-30 LAB — TROPONIN I: Troponin I: <0.03 ng/mL (ref <0.031)

## 2014-08-30 MED ORDER — PREDNISONE 20 MG PO TABS
60.0000 mg | ORAL_TABLET | Freq: Once | ORAL | Status: AC
  Administered 2014-08-30: 60 mg via ORAL
  Filled 2014-08-30: qty 3

## 2014-08-30 MED ORDER — ALBUTEROL SULFATE (2.5 MG/3ML) 0.083% IN NEBU
5.0000 mg | INHALATION_SOLUTION | Freq: Once | RESPIRATORY_TRACT | Status: AC
  Administered 2014-08-30: 5 mg via RESPIRATORY_TRACT
  Filled 2014-08-30: qty 6

## 2014-08-30 MED ORDER — ALBUTEROL (5 MG/ML) CONTINUOUS INHALATION SOLN
10.0000 mg/h | INHALATION_SOLUTION | RESPIRATORY_TRACT | Status: DC
  Filled 2014-08-30: qty 20

## 2014-08-30 MED ORDER — AZITHROMYCIN 250 MG PO TABS: 250.0000 mg | ORAL_TABLET | Freq: Every day | ORAL | Status: DC

## 2014-08-30 MED ORDER — IPRATROPIUM BROMIDE 0.02 % IN SOLN
0.5000 mg | Freq: Once | RESPIRATORY_TRACT | Status: AC
  Administered 2014-08-30: 0.5 mg via RESPIRATORY_TRACT
  Filled 2014-08-30: qty 2.5

## 2014-08-30 MED ORDER — PREDNISONE 20 MG PO TABS: 40.0000 mg | ORAL_TABLET | Freq: Every day | ORAL | Status: DC

## 2014-08-30 NOTE — ED Notes (Signed)
Unable to locate patient in waiting area; assumed to have left.

## 2014-08-30 NOTE — Discharge Instructions (Signed)
Take prednisone as directed until gone. Take azithromycin as directed until gone. Refer to attached documents for more information. Follow up with [facility] outpatient clinic.

## 2014-08-30 NOTE — ED Notes (Signed)
The npt ms pain is worse with movement and inspiration

## 2014-08-30 NOTE — ED Notes (Signed)
Provider at the bedside.  

## 2014-08-30 NOTE — ED Notes (Signed)
The pt is c/o rt sided chest pain  For one week  With some sob none now.  Current smoker no temp

## 2014-08-30 NOTE — ED Provider Notes (Signed)
CSN: [id number]     Arrival date & time 08/30/14  0041 History   First MD Initiated Contact with Patient 08/30/14 [phone]     Chief Complaint  Patient presents with  . Chest Pain     (Consider location/radiation/quality/duration/timing/severity/associated sxs/prior Treatment) HPI Comments: Patient is a 49 year old female with a past medical history of COPD, hypertension, and nicotine dependence who presents with chest pain for the past week. Symptoms started gradually and remained constant since the onset. The pain is aching and severe and located in her right chest without radiation. She reports associated SOB. Deep inspiration makes the pain worse. No alleviating factors. She reports associated productive cough with yellow sputum. She did not try anything at home for symptoms.    Past Medical History  Diagnosis Date  . COPD (chronic obstructive pulmonary disease)   . Diabetes mellitus without complication   . Hypertension   . Back pain, chronic   . IV drug abuse recovery    hx of   Past Surgical History  Procedure Laterality Date  . Back surgery    . External fixation arm Left   . Knee arthroscopy Bilateral   . Tonsillectomy    . Tubal ligation     Family History  Problem Relation Age of Onset  . Diabetes Mellitus II Mother    History  Substance Use Topics  . Smoking status: Current Every Day Smoker -- 1.00 packs/day    Types: Cigarettes  . Smokeless tobacco: Not on file  . Alcohol Use: No   OB History    No data available     Review of Systems  Constitutional: Negative for fever, chills and fatigue.  HENT: Negative for trouble swallowing.   Eyes: Negative for visual disturbance.  Respiratory: Positive for shortness of breath.   Cardiovascular: Positive for chest pain. Negative for palpitations.  Gastrointestinal: Negative for nausea, vomiting, abdominal pain and diarrhea.  Genitourinary: Negative for dysuria and difficulty urinating.  Musculoskeletal: Negative for  arthralgias and neck pain.  Skin: Negative for color change.  Neurological: Negative for dizziness and weakness.  Psychiatric/Behavioral: Negative for dysphoric mood.      Allergies  Sulfa antibiotics  Home Medications   Prior to Admission medications   Medication Sig Start Date End Date Taking? Authorizing Provider  albuterol (PROVENTIL HFA;VENTOLIN HFA) 108 (90 BASE) MCG/ACT inhaler Inhale 2 puffs into the lungs every 6 (six) hours as needed for wheezing. 12/01/12   Renae Fickle, MD  albuterol (PROVENTIL) (2.5 MG/3ML) 0.083% nebulizer solution Take 3 mLs (2.5 mg total) by nebulization every 6 (six) hours as needed for wheezing or shortness of breath. 12/01/12   Renae Fickle, MD  Alum & Mag Hydroxide-Simeth (MAGIC MOUTHWASH W/LIDOCAINE) SOLN Take 1 mL by mouth 3 (three) times daily as needed for mouth pain. 07/06/13   Susy Frizzle, MD  beclomethasone (QVAR) 40 MCG/ACT inhaler Inhale 2 puffs into the lungs 2 (two) times daily. 12/01/12   Renae Fickle, MD  benzonatate (TESSALON) 200 MG capsule Take 1 capsule (200 mg total) by mouth 3 (three) times daily. 12/01/12   Renae Fickle, MD  DULoxetine (CYMBALTA) 20 MG capsule Take 1 capsule (20 mg total) by mouth daily. 12/01/12   Renae Fickle, MD  guaiFENesin-dextromethorphan (ROBITUSSIN DM) 100-10 MG/5ML syrup Take 15 mLs by mouth every 6 (six) hours as needed for cough. 12/01/12   Renae Fickle, MD  ipratropium (ATROVENT) 0.02 % nebulizer solution Take 2.5 mLs (500 mcg total) by nebulization 4 (four) times daily. 12/01/12  Renae Fickle, MD  METHADONE HCL PO Take 100 mg by mouth daily. Medication is in liquid form    Historical Provider, MD  nicotine (NICODERM CQ - DOSED IN MG/24 HOURS) 21 mg/24hr patch Place 1 patch onto the skin daily. 12/01/12   Renae Fickle, MD  oxymetazoline (AFRIN) 0.05 % nasal spray Place 1 spray into the nose 2 (two) times daily as needed for congestion (do not use more than 3 consecutive days.). 12/01/12    Renae Fickle, MD   BP 131/80 mmHg  Pulse 71  Temp(Src) 97.9 F (36.6 C) (Oral)  Resp 19  Ht  (1.626 m)  Wt 160 lb 3 oz (72.661 kg)  BMI 27.48 kg/m2  SpO2 91% Physical Exam  Constitutional: She is oriented to person, place, and time. She appears well-developed and well-nourished. No distress.  HENT:  Head: Normocephalic and atraumatic.  Eyes: Conjunctivae and EOM are normal.  Neck: Normal range of motion.  Cardiovascular: Normal rate and regular rhythm.  Exam reveals no gallop and no friction rub.   No murmur heard. No lower extremity edema.   Pulmonary/Chest: Effort normal. She has wheezes. She has no rales. She exhibits no tenderness.  Inspiratory and expiratory wheezing and rhonchi noted in all lung fields.   Abdominal: Soft. She exhibits no distension. There is no tenderness. There is no rebound.  Musculoskeletal: Normal range of motion.  Neurological: She is alert and oriented to person, place, and time. Coordination normal.  Speech is goal-oriented. Moves limbs without ataxia.   Skin: Skin is warm and dry.  Psychiatric: She has a normal mood and affect. Her behavior is normal.  Nursing note and vitals reviewed.   ED Course  Procedures (including critical care time) Labs Review Labs Reviewed  CBC WITH DIFFERENTIAL/PLATELET - Abnormal; Notable for the following:    Platelets 142 (*)    All other components within normal limits  BRAIN NATRIURETIC PEPTIDE  TROPONIN I  URINALYSIS, ROUTINE W REFLEX MICROSCOPIC (NOT AT [facility])    Imaging Review Dg Chest 2 View  08/30/2014   CLINICAL DATA:  Acute onset of right lateral chest pain for 1 week. Initial encounter.  EXAM: CHEST  2 VIEW  COMPARISON:  Chest radiograph performed 07/06/2013  FINDINGS: The lungs are well-aerated. Vascular congestion is noted. There is no evidence of focal opacification, pleural effusion or pneumothorax.  The heart is normal in size; the mediastinal contour is within normal limits. No acute  osseous abnormalities are seen.  IMPRESSION: Vascular congestion noted; lungs remain grossly clear.   Electronically Signed   By: Roanna Raider M.D.   On: 08/30/2014 01:31     EKG Interpretation   Date/Time:  Monday August 30 2014 00:50:47 EDT Ventricular Rate:  70 PR Interval:  166 QRS Duration: 88 QT Interval:  428 QTC Calculation: 462 R Axis:   84 Text Interpretation:  Normal sinus rhythm Nonspecific T wave abnormality  Abnormal ECG new abnormality in V2, V3 Confirmed by Erroll Luna  [phone]) on 08/30/2014 5:41:18 AM      MDM   Final diagnoses:  COPD exacerbation    6:18 AM Patient will have nebulizer treatment and prednisone for COPD exacerbation. Patient is PERC negative.   8:34 AM Patient feeling better and will be discharged with azithromycin and prednisone. Patient reports she has to go because she needs to meet with her probation officer and go to the methadone clinic.   Emilia Beck, PA-C 08/30/14 0102  Tomasita Crumble, MD 08/30/14 1517

## 2014-08-30 NOTE — ED Notes (Signed)
Called for room assignment without answer.

## 2014-08-30 NOTE — ED Notes (Signed)
Informed pt of hour long neb treatment; pt states she has an appointment with her probation officer at 8 and another appointment at the methadone clinic at 1030am and can not wait that long for a treatment

## 2014-08-30 NOTE — ED Notes (Signed)
Patient thought to have left, but awoke and presented to triage desk.

## 2015-01-06 ENCOUNTER — Emergency Department (HOSPITAL_COMMUNITY)
Admission: EM | Admit: 2015-01-06 | Discharge: 2015-01-06 | Disposition: A | Discharge status: Home / Self Care | Payer: Self-pay | Attending: Emergency Medicine | Admitting: Emergency Medicine

## 2015-01-06 ENCOUNTER — Encounter (HOSPITAL_COMMUNITY): Payer: Self-pay | Admitting: Neurology

## 2015-01-06 ENCOUNTER — Emergency Department (HOSPITAL_COMMUNITY): Payer: Self-pay

## 2015-01-06 DIAGNOSIS — Z79899 Other long term (current) drug therapy: Secondary | ICD-10-CM | POA: Insufficient documentation

## 2015-01-06 DIAGNOSIS — E119 Type 2 diabetes mellitus without complications: Secondary | ICD-10-CM | POA: Insufficient documentation

## 2015-01-06 DIAGNOSIS — Z720 Tobacco use: Secondary | ICD-10-CM | POA: Insufficient documentation

## 2015-01-06 DIAGNOSIS — Y9289 Other specified places as the place of occurrence of the external cause: Secondary | ICD-10-CM | POA: Insufficient documentation

## 2015-01-06 DIAGNOSIS — Y998 Other external cause status: Secondary | ICD-10-CM | POA: Insufficient documentation

## 2015-01-06 DIAGNOSIS — S12000A Unspecified displaced fracture of first cervical vertebra, initial encounter for closed fracture: Secondary | ICD-10-CM

## 2015-01-06 DIAGNOSIS — S12030A Displaced posterior arch fracture of first cervical vertebra, initial encounter for closed fracture: Secondary | ICD-10-CM | POA: Insufficient documentation

## 2015-01-06 DIAGNOSIS — I10 Essential (primary) hypertension: Secondary | ICD-10-CM | POA: Insufficient documentation

## 2015-01-06 DIAGNOSIS — X58XXXA Exposure to other specified factors, initial encounter: Secondary | ICD-10-CM | POA: Insufficient documentation

## 2015-01-06 DIAGNOSIS — Y9389 Activity, other specified: Secondary | ICD-10-CM | POA: Insufficient documentation

## 2015-01-06 DIAGNOSIS — J449 Chronic obstructive pulmonary disease, unspecified: Secondary | ICD-10-CM | POA: Insufficient documentation

## 2015-01-06 DIAGNOSIS — Z7951 Long term (current) use of inhaled steroids: Secondary | ICD-10-CM | POA: Insufficient documentation

## 2015-01-06 DIAGNOSIS — G8929 Other chronic pain: Secondary | ICD-10-CM | POA: Insufficient documentation

## 2015-01-06 MED ORDER — METHOCARBAMOL 750 MG PO TABS: 750.0000 mg | ORAL_TABLET | Freq: Four times a day (QID) | ORAL | Status: DC

## 2015-01-06 MED ORDER — HYDROMORPHONE HCL 2 MG PO TABS
2.0000 mg | ORAL_TABLET | Freq: Once | ORAL | Status: AC
  Administered 2015-01-06: 2 mg via ORAL
  Filled 2015-01-06: qty 1

## 2015-01-06 MED ORDER — KETOROLAC TROMETHAMINE 60 MG/2ML IM SOLN: 60.0000 mg | Freq: Once | INTRAMUSCULAR | Status: DC

## 2015-01-06 NOTE — ED Notes (Signed)
Patient transported to CT 

## 2015-01-06 NOTE — ED Notes (Signed)
Pt is in stable condition upon d/c and is escorted from ED via wheelchair. 

## 2015-01-06 NOTE — ED Notes (Signed)
Pt reports this morning she went outside to smoke and sat on a chair, she fell off the chair and rolled onto the rose bush because she fell asleep. Reports her head hit the concrete. C/o head pain and neck pain. Pt is ambulatory, a x 4. Denies blood thinners. No LOC.

## 2015-01-06 NOTE — ED Provider Notes (Signed)
CSN: [id number]     Arrival date & time 01/06/15  4098 History   First MD Initiated Contact with Patient 01/06/15 1027     Chief Complaint  Patient presents with  . Fall     (Consider location/radiation/quality/duration/timing/severity/associated sxs/prior Treatment) HPI Comments: Patient here after a fall yesterday evening while she was sitting on the porch. Phone to some rose bushes. Unknown loss of consciousness. No bowel or bladder dysfunction Complains of bilateral upper cervical pain which raised her occiput. Pain is characterized as sharp and worse with movement of her head or neck. Denies upper or lower extremity weakness. Denies any chest or abdominal pain. No pain from the waist down. Patient used her regular dose of methadone this morning and did not have relief of her symptoms. Does take methadone due to chronic back pain.  Patient is a 49 y.o. female presenting with fall. The history is provided by the patient.  Fall    Past Medical History  Diagnosis Date  . COPD (chronic obstructive pulmonary disease) (HCC)   . Diabetes mellitus without complication (HCC)   . Hypertension   . Back pain, chronic   . IV drug abuse recovery    hx of   Past Surgical History  Procedure Laterality Date  . Back surgery    . External fixation arm Left   . Knee arthroscopy Bilateral   . Tonsillectomy    . Tubal ligation     Family History  Problem Relation Age of Onset  . Diabetes Mellitus II Mother    Social History  Substance Use Topics  . Smoking status: Current Every Day Smoker -- 1.00 packs/day    Types: Cigarettes  . Smokeless tobacco: None  . Alcohol Use: No   OB History    No data available     Review of Systems  All other systems reviewed and are negative.     Allergies  Sulfa antibiotics  Home Medications   Prior to Admission medications   Medication Sig Start Date End Date Taking? Authorizing Provider  albuterol (PROVENTIL HFA;VENTOLIN HFA) 108 (90 BASE)  MCG/ACT inhaler Inhale 2 puffs into the lungs every 6 (six) hours as needed for wheezing. 12/01/12   Renae Fickle, MD  albuterol (PROVENTIL) (2.5 MG/3ML) 0.083% nebulizer solution Take 3 mLs (2.5 mg total) by nebulization every 6 (six) hours as needed for wheezing or shortness of breath. 12/01/12   Renae Fickle, MD  Alum & Mag Hydroxide-Simeth (MAGIC MOUTHWASH W/LIDOCAINE) SOLN Take 1 mL by mouth 3 (three) times daily as needed for mouth pain. 07/06/13   Susy Frizzle, MD  azithromycin (ZITHROMAX Z-PAK) 250 MG tablet Take 1 tablet (250 mg total) by mouth daily.  PO day 1, then  PO days 205 08/30/14   Kaitlyn Szekalski, PA-C  beclomethasone (QVAR) 40 MCG/ACT inhaler Inhale 2 puffs into the lungs 2 (two) times daily. 12/01/12   Renae Fickle, MD  benzonatate (TESSALON) 200 MG capsule Take 1 capsule (200 mg total) by mouth 3 (three) times daily. 12/01/12   Renae Fickle, MD  DULoxetine (CYMBALTA) 20 MG capsule Take 1 capsule (20 mg total) by mouth daily. 12/01/12   Renae Fickle, MD  guaiFENesin-dextromethorphan (ROBITUSSIN DM) 100-10 MG/5ML syrup Take 15 mLs by mouth every 6 (six) hours as needed for cough. 12/01/12   Renae Fickle, MD  ipratropium (ATROVENT) 0.02 % nebulizer solution Take 2.5 mLs (500 mcg total) by nebulization 4 (four) times daily. 12/01/12   Renae Fickle, MD  METHADONE HCL PO Take 100  mg by mouth daily. Medication is in liquid form    Historical Provider, MD  nicotine (NICODERM CQ - DOSED IN MG/24 HOURS) 21 mg/24hr patch Place 1 patch onto the skin daily. 12/01/12   Renae FickleMackenzie Short, MD  oxymetazoline (AFRIN) 0.05 % nasal spray Place 1 spray into the nose 2 (two) times daily as needed for congestion (do not use more than 3 consecutive days.). 12/01/12   Renae FickleMackenzie Short, MD  predniSONE (DELTASONE) 20 MG tablet Take 2 tablets (40 mg total) by mouth daily. 08/30/14   Kaitlyn Szekalski, PA-C   BP 165/109 mmHg  Pulse 66  Temp(Src) 98.3 F (36.8 C) (Oral)  Resp 16  SpO2  96% Physical Exam  Constitutional: She is oriented to person, place, and time. She appears well-developed and well-nourished.  Non-toxic appearance. No distress.  HENT:  Head: Normocephalic and atraumatic. Head is without abrasion, without contusion and without laceration.  Nose: Nose normal.  Mouth/Throat: Oropharynx is clear and moist and mucous membranes are normal.  Eyes: Conjunctivae and EOM are normal. Pupils are equal, round, and reactive to light.  Neck: Trachea normal and normal range of motion. Neck supple. No spinous process tenderness and no muscular tenderness present. No tracheal deviation present.    Cardiovascular: Normal rate, regular rhythm, normal heart sounds and normal pulses.  Exam reveals no gallop.   No murmur heard. Pulmonary/Chest: Effort normal. No stridor. No respiratory distress. She has no decreased breath sounds. She has no wheezes. She exhibits no crepitus, no deformity and no swelling.  Abdominal: Soft. Normal appearance and bowel sounds are normal. She exhibits no distension. There is no tenderness. There is no rebound and no CVA tenderness.  Musculoskeletal: Normal range of motion. She exhibits no edema or tenderness.  Neurological: She is alert and oriented to person, place, and time. She has normal strength. No cranial nerve deficit or sensory deficit. GCS eye subscore is 4. GCS verbal subscore is 5. GCS motor subscore is 6.  Skin: Skin is warm and dry.  Psychiatric: She has a normal mood and affect. Her speech is normal and behavior is normal.  Nursing note and vitals reviewed.   ED Course  Procedures (including critical care time) Labs Review Labs Reviewed - No data to display  Imaging Review No results found. I have personally reviewed and evaluated these images and lab results as part of my medical decision-making.   EKG Interpretation None      MDM   Final diagnoses:  None    Recent given oral pain meds here. Discussed patient's neck  CT with Dr. Yetta BarreJones from neurosurgery. Patient place and a hard collar and will follow-up with him in the office. She is neurologically intact at this time.    Lorre NickAnthony Corrie Reder, MD 01/06/15 1320

## 2015-01-06 NOTE — Discharge Instructions (Signed)
Bone Grafting Bone grafting is a surgical procedure to repair defective bones or certain types of broken bones. In this procedure, pieces of bone or a type of man-made (synthetic) bone is used to strengthen a broken bone or to fill in a defect. There are three basic types of bone grafts. Your graft may be from:  Your own bone (autograft). A bone graft is often taken from your hip, rib, or leg.  Donated bone from a tissue bank (allograft). These grafts are taken from healthy donors and are frozen for future use.  Synthetic bone substitutes. A bone graft supports your bone. It also stimulates healing. New bone tissue may grow from cells in the graft (osteogenesis). Certain proteins within the graft may also convert other cells to bone-forming cells (osteoinduction). Synthetic grafts do not stimulate new bone growth. They serve as a support that new bone tissue can grow into (osteoconduction). You may need a bone graft if your bone broke into many pieces (complex fracture) and is difficult to treat. A bone graft may also be used to fuse two bones together. This is often done in spinal surgery. Bone grafting may also be part of treatment for cancer or for severe injuries that require reconstruction of the head and face. LET [facility] CARE PROVIDER KNOW ABOUT:  Any allergies you have.  All medicines you are taking, including vitamins, herbs, eye drops, creams, and over-the-counter medicines.  Previous problems you or members of your family have had with the use of anesthetics.  Any blood disorders you have.  Previous surgeries you have had.  Any medical conditions you have. RISKS AND COMPLICATIONS Generally, this is a safe procedure. However, problems may occur, including:  Infection.  Bleeding.  Delayed healing.  Graft failure. BEFORE THE PROCEDURE  Follow instructions from your health care provider about eating or drinking restrictions.  Ask your health care provider  about:  Changing or stopping your regular medicines. This is especially important if you are taking diabetes medicines or blood thinners.  Taking medicines such as aspirin and ibuprofen. These medicines can thin your blood. Do not take these medicines before your procedure if your health care provider instructs you not to.  If you smoke, try to quit before surgery. Smoking delays healing and increases your risk for complications. If you need help quitting, ask your health care provider.  Ask your health care provider if you should donate some of your own blood in case you need to receive blood through your IV tube (transfusion).  Ask your health care provider how your surgical site will be marked or identified.  You may be given antibiotic medicine to help prevent infection.  Plan to have someone take you home after the procedure. PROCEDURE  To reduce your risk of infection:  Your health care team will wash or sanitize their hands.  Your skin will be washed with soap.  An IV tube will be inserted into one of your veins.  You will be given a medicine that makes you fall asleep (general anesthetic).  If you are having an autograft:  An incision will be made over the autograft site. Bone tissue will be removed.  The incision will be closed with stitches (sutures) or staples.  Your surgeon will make an incision to open up the area over the bone that is to be grafted.  The bone graft will be placed around the bone. It may be held in place with pins, plates, or screws.  Your surgeon will close  the incision with sutures or staples.  A bandage (dressing) will be placed over the incision. The procedure may vary among health care providers and hospitals. AFTER THE PROCEDURE  Your blood pressure, heart rate, breathing rate, and blood oxygen level will be monitored often until the medicines you were given have worn off.  You will be given pain medicine as needed.  You may be sent  home with a cast, splint, or brace.   This information is not intended to replace advice given to you by your health care provider. Make sure you discuss any questions you have with your health care provider.   Document Released: 08/25/2001 Document Revised: 07/20/2014 Document Reviewed: 01/06/2014 Elsevier Interactive Patient Education 2016 Elsevier Inc. Cervical Spine Fracture, Stable A cervical spine fracture is a break or crack in one of the bones of the neck. A fracture is stable if the chances of it causing you problems while it is healing are very small. CAUSES   Vehicle accidents.  Injuries from sports such as diving, football, biking, wrestling, or skiing.  Occasionally, severe osteoporosis or other bone diseases, such as cancers that spread to bone or metabolic abnormalities. SYMPTOMS   Severe neck pain after an accident or fall.  Pain down your shoulders or arms.  Bruising or swelling on the back of your neck.  Numbness, tingling, muscle spasm, or weakness. DIAGNOSIS  Cervical spine fracture is diagnosed with the help of X-ray exams of your neck. Often a CT scan or MRI is used to confirm the diagnosis and help determine how your injury should be treated. Generally, an examination of your neck, arms, and legs, and the history of your injury prompts the health care provider to order these tests.  TREATMENT  A stable fracture needs to be treated with a brace or cervical collar. A cervical collar is a two-piece collar designed to keep your neck from moving during the healing process. HOME CARE INSTRUCTIONS  Limit physical activity to prevent worsening of the fracture.  Apply ice to areas of pain 3-4 times a day for 2 days.  Put ice in a bag.  Place a towel between your skin and the bag.  Leave the ice on for 15-20 minutes, 3-4 times a day.  You may have been given a cervical collar to wear.  Do not remove the collar unless instructed by your health care  provider.  If you have long hair, keep it outside of the collar.  Ask your health care provider before making any adjustments to your collar. Minor adjustments may be required over time to improve comfort and reduce pressure on your chin or on the back of your head.  Keep your collar clean by wiping it with mild soap and water and drying it completely. The pads can be hand washed with soap and water and air dried completely.  If you are allowed to remove the collar for cleaning or bathing, follow your health care provider's instructions on how to do so safely.  If you are allowed to remove the collar for cleaning and bathing, wash and dry the skin of your neck. Check your skin for irritation or sores. If you see any, tell your health care provider.  Only take over-the-counter or prescription medicines for pain, discomfort, or fever as directed by your health care provider.   Keep all follow-up appointments as directed by your health care provider. Not keeping an appointment could result in a chronic or permanent injury, pain, and disability. Additionally, X-rays  or an MRI may be repeated 1-3 weeks after your initial appointment. This is to:  Make sure any other breaks or cracks were not missed.   Help identify stretched or torn ligaments.   Get your test results if you did not get them when you were first evaluated. The results will determine whether you need other tests or treatment. It is your responsibility to get the results. SEEK MEDICAL CARE IF: You have irritation or sores on your skin from the cervical collar. SEEK IMMEDIATE MEDICAL CARE IF:   You have increasing pain in your neck.   You develop difficulties swallowing or breathing.  You develop swelling in your neck.   You have numbness, weakness, burning pain, or movement problems in the arms or legs.   You are unable to control your bowel or bladder (incontinence).   You have problems with coordination or  difficulty walking. MAKE SURE YOU:   Understand these instructions.  Will watch your condition.  Will get help right away if you are not doing well or get worse.   This information is not intended to replace advice given to you by your health care provider. Make sure you discuss any questions you have with your health care provider.   Document Released: 01/21/2004 Document Revised: 03/10/2013 Document Reviewed: 09/29/2012 Elsevier Interactive Patient Education [employer]2016 Elsevier Inc.

## 2015-01-06 NOTE — ED Notes (Signed)
Pt has abrasions to right side of face/head

## 2015-03-17 ENCOUNTER — Encounter (HOSPITAL_COMMUNITY): Payer: Self-pay | Admitting: Emergency Medicine

## 2015-03-17 ENCOUNTER — Emergency Department (HOSPITAL_COMMUNITY)
Admission: EM | Admit: 2015-03-17 | Discharge: 2015-03-17 | Disposition: A | Discharge status: Home / Self Care | Payer: Self-pay | Attending: Emergency Medicine | Admitting: Emergency Medicine

## 2015-03-17 DIAGNOSIS — R220 Localized swelling, mass and lump, head: Secondary | ICD-10-CM

## 2015-03-17 DIAGNOSIS — Z8619 Personal history of other infectious and parasitic diseases: Secondary | ICD-10-CM | POA: Insufficient documentation

## 2015-03-17 DIAGNOSIS — G8929 Other chronic pain: Secondary | ICD-10-CM | POA: Insufficient documentation

## 2015-03-17 DIAGNOSIS — J449 Chronic obstructive pulmonary disease, unspecified: Secondary | ICD-10-CM | POA: Insufficient documentation

## 2015-03-17 DIAGNOSIS — K047 Periapical abscess without sinus: Secondary | ICD-10-CM | POA: Insufficient documentation

## 2015-03-17 DIAGNOSIS — F17210 Nicotine dependence, cigarettes, uncomplicated: Secondary | ICD-10-CM | POA: Insufficient documentation

## 2015-03-17 DIAGNOSIS — Z79899 Other long term (current) drug therapy: Secondary | ICD-10-CM | POA: Insufficient documentation

## 2015-03-17 DIAGNOSIS — I10 Essential (primary) hypertension: Secondary | ICD-10-CM | POA: Insufficient documentation

## 2015-03-17 DIAGNOSIS — K029 Dental caries, unspecified: Secondary | ICD-10-CM | POA: Insufficient documentation

## 2015-03-17 DIAGNOSIS — E119 Type 2 diabetes mellitus without complications: Secondary | ICD-10-CM | POA: Insufficient documentation

## 2015-03-17 HISTORY — DX: Unspecified viral hepatitis C without hepatic coma: B19.20

## 2015-03-17 MED ORDER — CLINDAMYCIN PHOSPHATE 900 MG/50ML IV SOLN
900.0000 mg | Freq: Once | INTRAVENOUS | Status: AC
  Administered 2015-03-17: 900 mg via INTRAVENOUS
  Filled 2015-03-17: qty 50

## 2015-03-17 MED ORDER — MORPHINE SULFATE (PF) 4 MG/ML IV SOLN
4.0000 mg | Freq: Once | INTRAVENOUS | Status: AC
  Administered 2015-03-17: 4 mg via INTRAVENOUS
  Filled 2015-03-17: qty 1

## 2015-03-17 MED ORDER — AMOXICILLIN 500 MG PO CAPS: 1000.0000 mg | ORAL_CAPSULE | Freq: Three times a day (TID) | ORAL | Status: DC

## 2015-03-17 MED ORDER — LIDOCAINE-EPINEPHRINE (PF) 2 %-1:200000 IJ SOLN
10.0000 mL | Freq: Once | INTRAMUSCULAR | Status: AC
  Administered 2015-03-17: 10 mL
  Filled 2015-03-17: qty 20

## 2015-03-17 MED ORDER — LORAZEPAM 2 MG/ML IJ SOLN
0.5000 mg | Freq: Once | INTRAMUSCULAR | Status: AC
  Administered 2015-03-17: 0.5 mg via INTRAVENOUS
  Filled 2015-03-17: qty 1

## 2015-03-17 MED ORDER — SODIUM CHLORIDE 0.9 % IV BOLUS (SEPSIS)
1000.0000 mL | Freq: Once | INTRAVENOUS | Status: AC
  Administered 2015-03-17: 1000 mL via INTRAVENOUS

## 2015-03-17 MED ORDER — ONDANSETRON HCL 4 MG/2ML IJ SOLN
4.0000 mg | Freq: Once | INTRAMUSCULAR | Status: AC
  Administered 2015-03-17: 4 mg via INTRAVENOUS
  Filled 2015-03-17: qty 2

## 2015-03-17 NOTE — ED Provider Notes (Signed)
CSN: [id number]     Arrival date & time 03/17/15  1112 History   First MD Initiated Contact with Patient 03/17/15 1239     Chief Complaint  Patient presents with  . Facial Swelling     (Consider location/radiation/quality/duration/timing/severity/associated sxs/prior Treatment) The history is provided by the patient.  Patient c/o left lower jaw/facial swelling onset yesterday. Had been having dental pain in area, with multiple decayed, broken off teeth.  Dental pain mod-sev, constant, non radiating. Has no local dentist. No recent abx use. Denies fever or chills. No throat swelling or difficulty swallowing. No stridor or difficulty breathing.   Pt notes swelling has increased today. No hx dm.   Past Medical History  Diagnosis Date  . COPD (chronic obstructive pulmonary disease) (HCC)   . Diabetes mellitus without complication (HCC)   . Hypertension   . Back pain, chronic   . IV drug abuse recovery    hx of  . Hepatitis C    Past Surgical History  Procedure Laterality Date  . Back surgery    . External fixation arm Left   . Knee arthroscopy Bilateral   . Tonsillectomy    . Tubal ligation     Family History  Problem Relation Age of Onset  . Diabetes Mellitus II Mother    Social History  Substance Use Topics  . Smoking status: Current Every Day Smoker -- 1.00 packs/day    Types: Cigarettes  . Smokeless tobacco: None  . Alcohol Use: No   OB History    No data available     Review of Systems  Constitutional: Negative for fever and chills.  HENT: Negative for sore throat, trouble swallowing and voice change.   Eyes: Negative for redness.  Respiratory: Negative for shortness of breath.   Cardiovascular: Negative for chest pain.  Gastrointestinal: Negative for vomiting and abdominal pain.  Genitourinary: Negative for flank pain.  Musculoskeletal: Negative for back pain and neck pain.  Skin: Negative for rash.  Neurological: Negative for headaches.  Hematological:  Does not bruise/bleed easily.  Psychiatric/Behavioral: Negative for confusion.      Allergies  Sulfa antibiotics  Home Medications   Prior to Admission medications   Medication Sig Start Date End Date Taking? Authorizing Provider  albuterol (PROVENTIL HFA;VENTOLIN HFA) 108 (90 BASE) MCG/ACT inhaler Inhale 2 puffs into the lungs every 6 (six) hours as needed for wheezing. 12/01/12  Yes Renae FickleMackenzie Short, MD  albuterol (PROVENTIL) (2.5 MG/3ML) 0.083% nebulizer solution Take 3 mLs (2.5 mg total) by nebulization every 6 (six) hours as needed for wheezing or shortness of breath. 12/01/12  Yes Renae FickleMackenzie Short, MD  gabapentin (NEURONTIN) 300 MG capsule Take 600 mg by mouth 3 (three) times daily.   Yes Historical Provider, MD  Garlic 1000 MG CAPS Take 6,000 mg by mouth 2 (two) times daily.   Yes Historical Provider, MD  ipratropium (ATROVENT) 0.02 % nebulizer solution Take 2.5 mLs (500 mcg total) by nebulization 4 (four) times daily. Patient taking differently: Take 500 mcg by nebulization every 4 (four) hours as needed for wheezing or shortness of breath.  12/01/12  Yes Renae FickleMackenzie Short, MD  METHADONE HCL PO Take 55 mg by mouth daily. Medication is in liquid form   Yes Historical Provider, MD  oxymetazoline (AFRIN) 0.05 % nasal spray Place 1 spray into the nose 2 (two) times daily as needed for congestion (do not use more than 3 consecutive days.). 12/01/12  Yes Renae FickleMackenzie Short, MD  Alum & Mag Hydroxide-Simeth (MAGIC MOUTHWASH  W/LIDOCAINE) SOLN Take 1 mL by mouth 3 (three) times daily as needed for mouth pain. Patient not taking: Reported on 03/17/2015 07/06/13   Susy Frizzle, MD  beclomethasone (QVAR) 40 MCG/ACT inhaler Inhale 2 puffs into the lungs 2 (two) times daily. Patient not taking: Reported on 03/17/2015 12/01/12   Renae Fickle, MD  DULoxetine (CYMBALTA) 20 MG capsule Take 1 capsule (20 mg total) by mouth daily. Patient not taking: Reported on 03/17/2015 12/01/12   Renae Fickle, MD   methocarbamol (ROBAXIN-750) 750 MG tablet Take 1 tablet (750 mg total) by mouth 4 (four) times daily. Patient not taking: Reported on 03/17/2015 01/06/15   Lorre Nick, MD  nicotine (NICODERM CQ - DOSED IN MG/24 HOURS) 21 mg/24hr patch Place 1 patch onto the skin daily. 12/01/12   Renae Fickle, MD   BP 131/81 mmHg  Pulse 107  Temp(Src) 98.6 F (37 C) (Oral)  Resp 18  SpO2 100% Physical Exam  Constitutional: She appears well-developed and well-nourished. No distress.  HENT:  Mouth/Throat: Oropharynx is clear and moist.  Multiple decayed and broken off teeth, esp lower.  Pt with marked swelling/tenderness, base teeth 20-23. +moderate left anterior mandible region swelling. No neck mass/swellling. Trachea midline. No trismus.   Eyes: Conjunctivae are normal. No scleral icterus.  Neck: Neck supple. No tracheal deviation present.  Cardiovascular: Normal rate, regular rhythm, normal heart sounds and intact distal pulses.   Pulmonary/Chest: Effort normal and breath sounds normal. No respiratory distress.  Abdominal: Normal appearance. She exhibits no distension. There is no tenderness.  Musculoskeletal: She exhibits no edema.  Lymphadenopathy:    She has no cervical adenopathy.  Neurological: She is alert.  Skin: Skin is warm and dry. No rash noted. She is not diaphoretic.  Psychiatric: She has a normal mood and affect.  Nursing note and vitals reviewed.   ED Course  Procedures (including critical care time) Labs Review  I have personally reviewed and evaluated these images and lab results as part of my medical decision-making.   MDM   Pt presents w dental abscess. Markedly swelling to left jaw/mandible region. No appreciable neck swelling.   Clindamycin iv.   Morphine iv. Pt anxious. Ativan .5 mg iv.   I and D performed.   INCISION AND DRAINAGE Performed by: Suzi Roots Consent: Verbal consent obtained. Risks and benefits: risks, benefits and alternatives were  discussed Type: abscess  Body area: left dental/oral abscess  Anesthesia: local infiltration  Incision was made with a scalpel.  Local anesthetic: lidocaine 2% w epinephrine  Anesthetic total: 5 ml  Drainage: purulent  Drainage amount: small   Patient tolerance: Patient tolerated the procedure well with no immediate complications.    Recheck no increased swelling. Pt is drinking fluids. No resp symptoms or difficulty.   Will refer to close dental follow up.   Pt currently appear stable for d/c.   Cm indicates pt will be unable to get clinda filled, due to cost, will rx amox.   F/u tomorrow.     Cathren Laine, MD 03/17/15 [phone]

## 2015-03-17 NOTE — ED Notes (Signed)
Pt c/o dental pain and swelling to left lower jaw. Jaw is significantly edematous, teeth are broken. Pt is homeless and unable to get much care.

## 2015-03-17 NOTE — ED Notes (Signed)
Bed: [id number] Expected date:  Expected time:  Means of arrival:  Comments: PT in room

## 2015-03-17 NOTE — Progress Notes (Addendum)
CM spoke with [place] who [place]cp.  CM discussed and provided written information for uninsured accepting pcps, discussed the importance of pcp vs EDP services for f/u care, www.needymeds.org, www.goodrx.com, discounted pharmacies and other [place] resources such as [facility] , [facility], affordable care act, financial assistance, uninsured dental services, [place] med assist, DSS and  health department  Reviewed resources for [place] uninsured accepting pcps like [facility], family medicine at [address], [facility], [facility], local urgent care centers, [facility], [facility] family practice, general medical clinics, [facility], [facility] urgent care plus others, medication resources, [facility] out patient pharmacies and housing Pt voiced understanding and appreciation of resources provided   Provided [facility] contact information Pt agreed to a referral Cm completed referral Pt to be contact by [facility] clinical liaison  Pt gave CM an updated # as [phone] to be reached  Pt states she tried to get an orange card by going to [facility] but went wrong day and having issues getting IRS information needed to complete application  Cm spoke with EDP Stein[place] about pt is uninsured and may not be able to afford cleocin at d/c

## 2015-03-17 NOTE — ED Notes (Signed)
MD at bedside. EDP PRESENT 

## 2015-03-17 NOTE — ED Notes (Signed)
MD at bedside. 

## 2015-03-17 NOTE — ED Notes (Signed)
Bed: [facility] Expected date:  Expected time:  Means of arrival:  Comments: 

## 2015-03-17 NOTE — ED Notes (Signed)
Called pharmacy on delay

## 2015-03-17 NOTE — Discharge Instructions (Signed)
It was our pleasure to provide your ER care today - we hope that you feel better.  Take amoxicillin as prescribed.   Take motrin or aleve as need for pain.  Follow up with dentist in the next 1-2 days for recheck - call office tomorrow morning to arrange follow up - see resource guide below.   Return to ER in the next 1-2 days for recheck if unable to be seen by a dentist.   Return to ER right away if worse, difficulty breathing or swallowing, increased neck swelling, severe pain, other concern.  You were given medication in the ER -no driving for the next 4 hours.      Dental Abscess A dental abscess is a collection of pus in or around a tooth. CAUSES This condition is caused by a bacterial infection around the root of the tooth that involves the inner part of the tooth (pulp). It may result from:  Severe tooth decay.  Trauma to the tooth that allows bacteria to enter into the pulp, such as a broken or chipped tooth.  Severe gum disease around a tooth. SYMPTOMS Symptoms of this condition include:  Severe pain in and around the infected tooth.  Swelling and redness around the infected tooth, in the mouth, or in the face.  Tenderness.  Pus drainage.  Bad breath.  Bitter taste in the mouth.  Difficulty swallowing.  Difficulty opening the mouth.  Nausea.  Vomiting.  Chills.  Swollen neck glands.  Fever. DIAGNOSIS This condition is diagnosed with examination of the infected tooth. During the exam, your dentist may tap on the infected tooth. Your dentist will also ask about your medical and dental history and may order X-rays. TREATMENT This condition is treated by eliminating the infection. This may be done with:  Antibiotic medicine.  A root canal. This may be performed to save the tooth.  Pulling (extracting) the tooth. This may also involve draining the abscess. This is done if the tooth cannot be saved. HOME CARE INSTRUCTIONS  Take medicines only as  directed by your dentist.  If you were prescribed antibiotic medicine, finish all of it even if you start to feel better.  Rinse your mouth (gargle) often with salt water to relieve pain or swelling.  Do not drive or operate heavy machinery while taking pain medicine.  Do not apply heat to the outside of your mouth.  Keep all follow-up visits as directed by your dentist. This is important. SEEK MEDICAL CARE IF:  Your pain is worse and is not helped by medicine. SEEK IMMEDIATE MEDICAL CARE IF:  You have a fever or chills.  Your symptoms suddenly get worse.  You have a very bad headache.  You have problems breathing or swallowing.  You have trouble opening your mouth.  You have swelling in your neck or around your eye.   This information is not intended to replace advice given to you by your health care provider. Make sure you discuss any questions you have with your health care provider.   Document Released: 03/05/2005 Document Revised: 07/20/2014 Document Reviewed: 03/02/2014 Elsevier Interactive Patient Education 2016 Elsevier Inc.  Dental Caries Dental caries (also called tooth decay) is the most common oral disease. It can occur at any age but is more common in children and young adults.  HOW DENTAL CARIES DEVELOPS  The process of decay begins when bacteria and foods (particularly sugars and starches) combine in your mouth to produce plaque. Plaque is a substance that sticks  to the hard, outer surface of a tooth (enamel). The bacteria in plaque produce acids that attack enamel. These acids may also attack the root surface of a tooth (cementum) if it is exposed. Repeated attacks dissolve these surfaces and create holes in the tooth (cavities). If left untreated, the acids destroy the other layers of the tooth.  RISK FACTORS  Frequent sipping of sugary beverages.   Frequent snacking on sugary and starchy foods, especially those that easily get stuck in the teeth.   Poor  oral hygiene.   Dry mouth.   Substance abuse such as methamphetamine abuse.   Broken or poor-fitting dental restorations.   Eating disorders.   Gastroesophageal reflux disease (GERD).   Certain radiation treatments to the head and neck. SYMPTOMS In the early stages of dental caries, symptoms are seldom present. Sometimes white, chalky areas may be seen on the enamel or other tooth layers. In later stages, symptoms may include:  Pits and holes on the enamel.  Toothache after sweet, hot, or cold foods or drinks are consumed.  Pain around the tooth.  Swelling around the tooth. DIAGNOSIS  Most of the time, dental caries is detected during a regular dental checkup. A diagnosis is made after a thorough medical and dental history is taken and the surfaces of your teeth are checked for signs of dental caries. Sometimes special instruments, such as lasers, are used to check for dental caries. Dental X-ray exams may be taken so that areas not visible to the eye (such as between the contact areas of the teeth) can be checked for cavities.  TREATMENT  If dental caries is in its early stages, it may be reversed with a fluoride treatment or an application of a remineralizing agent at the dental office. Thorough brushing and flossing at home is needed to aid these treatments. If it is in its later stages, treatment depends on the location and extent of tooth destruction:   If a small area of the tooth has been destroyed, the destroyed area will be removed and cavities will be filled with a material such as gold, silver amalgam, or composite resin.   If a large area of the tooth has been destroyed, the destroyed area will be removed and a cap (crown) will be fitted over the remaining tooth structure.   If the center part of the tooth (pulp) is affected, a procedure called a root canal will be needed before a filling or crown can be placed.   If most of the tooth has been destroyed, the  tooth may need to be pulled (extracted). HOME CARE INSTRUCTIONS You can prevent, stop, or reverse dental caries at home by practicing good oral hygiene. Good oral hygiene includes:  Thoroughly cleaning your teeth at least twice a day with a toothbrush and dental floss.   Using a fluoride toothpaste. A fluoride mouth rinse may also be used if recommended by your dentist or health care provider.   Restricting the amount of sugary and starchy foods and sugary liquids you consume.   Avoiding frequent snacking on these foods and sipping of these liquids.   Keeping regular visits with a dentist for checkups and cleanings. PREVENTION   Practice good oral hygiene.  Consider a dental sealant. A dental sealant is a coating material that is applied by your dentist to the pits and grooves of teeth. The sealant prevents food from being trapped in them. It may protect the teeth for several years.  Ask about fluoride supplements if  you live in a community without fluorinated water or with water that has a low fluoride content. Use fluoride supplements as directed by your dentist or health care provider.  Allow fluoride varnish applications to teeth if directed by your dentist or health care provider.   This information is not intended to replace advice given to you by your health care provider. Make sure you discuss any questions you have with your health care provider.   Document Released: 11/25/2001 Document Revised: 03/26/2014 Document Reviewed: 03/07/2012 Elsevier Interactive Patient Education 2016 [employer].     Emergency Department Resource Guide 1) Find a Doctor and Pay Out of Pocket Although you won't have to find out who is covered by your insurance plan, it is a good idea to ask around and get recommendations. You will then need to call the office and see if the doctor you have chosen will accept you as a new patient and what types of options they offer for patients who are  self-pay. Some doctors offer discounts or will set up payment plans for their patients who do not have insurance, but you will need to ask so you aren't surprised when you get to your appointment.  2) Contact Your Local Health Department Not all health departments have doctors that can see patients for sick visits, but many do, so it is worth a call to see if yours does. If you don't know where your local health department is, you can check in your phone book. The CDC also has a tool to help you locate your state's health department, and many state websites also have listings of all of their local health departments.  3) Find a Walk-in Clinic If your illness is not likely to be very severe or complicated, you may want to try a walk in clinic. These are popping up all over the country in pharmacies, drugstores, and shopping centers. They're usually staffed by nurse practitioners or physician assistants that have been trained to treat common illnesses and complaints. They're usually fairly quick and inexpensive. However, if you have serious medical issues or chronic medical problems, these are probably not your best option.  No Primary Care Doctor: - Call Health Connect at  [phone] - they can help you locate a primary care doctor that  accepts your insurance, provides certain services, etc. - Physician Referral Service- [phone]  Chronic Pain Problems: Organization         Address  Phone   Notes  [facility]  [phone] Patients need to be referred by their primary care doctor.   Medication Assistance: Organization         Address  Phone   Notes  [facility] Medication [facility] [address] [place] [place], [place] [address] [phone] --Must be a resident of [facility] -- Must have NO insurance coverage whatsoever (no Medicaid/ Medicare, etc.) -- The pt. MUST have a primary care doctor that directs their care regularly and follows them in  the community   MedAssist  [phone]   [employer]  [phone]    Agencies that provide inexpensive medical care: Organization         Address  Phone   Notes  [facility]  [phone]   [facility]    [phone]   [facility] [address] [place], [place] [address] [phone]   [facility] [address] [place] [place] (  [phone]   Planned Parenthood    [phone]   [facility]    [phone]   [facility]  [address], [place] Phone:  [phone], Fax:  [phone] Hours of Operation:  9 am - 6 pm, M-F.  Also accepts Medicaid/Medicare and self-pay.  [facility]  [address], [place] Phone: [phone], Fax: [phone]. Hours of Operation:  8:30 am - 5:30 pm, M-F.  Also accepts Medicaid and self-pay.  [facility] [place] [address] [place] Phone: [phone]   [facility] [address] Natasha Bence [place], [place] [phone], Ext. [phone] Mondays & Thursdays: 7-9 AM.  First 15 patients are seen on a first come, first serve basis.    Medicaid-accepting [facility] Providers:  Organization         Address  Phone   Notes  [facility] [address], [place] [phone] Also accepts self-pay patients.  [facility] [address] Laurell Josephs [place], [place]  [phone]   [facility] [address], [place] [phone]   [facility] [address] [place] [phone]   Renaye Rakers [address], [place]   [phone] Only accepts [place] patients after they have their name applied to their card.   Self-Pay (no insurance) in [facility]:  Organization         Address  Phone   Notes  Sickle Cell Patients,  [facility] [address] [place], [place] [phone]   [facility] [address] [place], [place] [phone]   [facility] [place]  [address], [place] [phone]   [facility]/Dr. Osei-Bonsu  [address] [place] or [address], [place] [phone] Phone number for both [place] and [place] locations is the same.  [facility] [address] [place] [phone]   [facility] [address] [place] or [address] [phone]   [facility] [address] [place] [phone], phone; [phone], fax Sees patients 1st and 3rd Saturday of every month.  Must not qualify for public or private insurance (i.e. Medicaid, Medicare, [place] Health Choice, Veterans' Benefits)  Household income should be no more than 200% of the poverty level The clinic cannot treat you if you are pregnant or think you are pregnant  Sexually transmitted diseases are not treated at the clinic.    Dental Care: Organization         Address  Phone  Notes  [facility] [address] [place], [place] [phone] Accepts children up to age 51 who are enrolled in [place] or [place] Health Choice; pregnant women with a Medicaid card; and children who have applied for Medicaid or [place] Health Choice, but were declined, whose parents can pay a reduced fee at time of service.  [facility] of [facility]  [address], [place] [phone] Accepts children up to age 35 who are enrolled in [place] or [place] Health Choice; pregnant women with a Medicaid card; and children who have applied for Medicaid or [place] Health Choice,  but were declined, whose parents can pay a reduced fee at time of service.  [facility]   [address] [place], [place] [phone] Patients are seen by appointment only. Walk-ins are not accepted. [facility] will see patients 28 years of age and older. Monday - Tuesday (8am-5pm) Most Wednesdays (8:30-5pm) $30 per visit, cash only  [facility] Access [facility]  [address], [facility] [phone] Patients are seen by appointment only. Walk-ins are not accepted. [facility] will see patients 43 years of age and older. One Wednesday Evening (Monthly: Volunteer Based).  $30 per visit, cash only  [employer]  [phone] for adults; Children under age 47, call Graduate Pediatric Dentistry at [phone]. Children aged 22-14, please call [phone] to request a pediatric application.  Dental services are provided in all areas of dental care including fillings, crowns and bridges, complete and partial dentures, implants, gum treatment, root canals, and extractions. Preventive care is also provided. Treatment is provided to both adults and children. Patients are selected via a lottery and there is often a waiting list.   [facility] [address] [place]  [phone] [email or web address]   [facility] [address] [place], [place] [phone], Ext. [phone] Second and Fourth Thursday of each month, opens at 6:30 AM; Clinic ends at 9 AM.  Patients are seen on a first-come first-served basis, and a limited number are seen during each clinic.   [facility]  [address] Ether Griffins [place], [place] [phone]   Eligibility Requirements You must have lived in [place], [place], or [place] counties for at least the last three months.   You cannot be eligible for state or federal sponsored [place], including [employer], [place], or [employer].   You generally cannot be eligible for healthcare insurance through your employer.    How to apply: Eligibility screenings are  held every Tuesday and Wednesday afternoon from 1:00 pm until 4:00 pm. You do not need an appointment for the interview!  [facility] [address], [place], [place] [phone]   [facility] Health Department  [phone]   [facility] Health Department  [phone]   [facility] Health Department  [phone]    Behavioral Health Resources in the Community: Intensive Outpatient Programs Organization         Address  Phone  Notes  [facility] [address] [place], [place] [phone]   [facility] [address] [place], [place] [phone]   [facility] [address] [place], [place]  [phone]   [facility] Mental Health [address]  [place], [place] [phone] or [phone]   Substance Abuse Resources Organization         Address  Phone  Notes  [facility]  [phone]   [facility]  [phone]   [facility]  [phone]   Floydene Flock  [phone]   Residential & Outpatient Substance Abuse Program  [phone]   Psychological Services Organization         Address  Phone  Notes  [facility] Behavioral Health  [phone]   [facility]  [phone]   [facility] Mental Health [address], [place] [phone] or [phone]    Mobile Crisis Teams Organization         Address  Phone  Notes  [facility], Mobile Crisis Care Unit  [phone]   [facility]  [address] [place], [place] [phone]   [facility] LL[facility]on DeEsch [address] [place] [phone]    Self-Help/Support Groups Organization         Address  Phone             Notes  [facility] [place] - variety of support groups  [phone]- [phone] Call for more information  Narcotics Anonymous (NA), [facility] [address], [place]  2 meetings at this location     Statisticianesidential Treatment Programs Organization         Address  Phone  Notes  [facility] [address],    [place]  [phone]   [facility]  [address] [place] [phone], [place], [place] [phone]   [facility] [address], [place] [phone] Admissions: 8am-3pm M-F  [facility] [address]    [place], [place] [phone]   [facility] [address], [place], [place] [phone]   [facility] [address]  [place], [place] [phone]   [facility] - Intensive Outpatient [address], [place], [place] [phone]   [facility] ([facility] [address]  [place], [place] [phone] or [phone]   [facility] ([facility]) [address] [place], [place] [phone] Accepts Medicaid  [facility] [address]  [place] [phone] Substance Abuse/Addiction Treatment   [facility] Behavioral Health Resources Organization         Address  Phone  Notes  [facility]  [phone]) [phone]   Angie FavaJulie Brannon, PhD [address] [place], [place]   [phone] or [phone]) [phone]   [facility] [place]   [address] [place], [place] [phone]   [facility] [address], [place], [place] [phone] Insurance/Medicaid/sponsorship through [facility]  Faith and Families [address]                                    [place], [place] [phone] Therapy/tele-psych/case  [facility] [address]   [place], [place] [phone]    Dr. Lolly MustacheArfeen  [phone]   [facility]  [employer] [facility] 1) [address], [place] 2) [address], [place] 3)  [address], [place] [phone]  [phone]) [phone]   [facility] Child Abuse Hotline [phone](336) [phone] or [phone]) [phone] (After  Hours)

## 2015-10-20 ENCOUNTER — Emergency Department (HOSPITAL_COMMUNITY): Payer: Self-pay

## 2015-10-20 ENCOUNTER — Encounter (HOSPITAL_COMMUNITY): Payer: Self-pay | Admitting: Emergency Medicine

## 2015-10-20 ENCOUNTER — Emergency Department (HOSPITAL_COMMUNITY)
Admission: EM | Admit: 2015-10-20 | Discharge: 2015-10-20 | Disposition: A | Discharge status: Home / Self Care | Payer: Self-pay | Attending: Emergency Medicine | Admitting: Emergency Medicine

## 2015-10-20 DIAGNOSIS — Y9301 Activity, walking, marching and hiking: Secondary | ICD-10-CM | POA: Insufficient documentation

## 2015-10-20 DIAGNOSIS — Z79899 Other long term (current) drug therapy: Secondary | ICD-10-CM | POA: Insufficient documentation

## 2015-10-20 DIAGNOSIS — J449 Chronic obstructive pulmonary disease, unspecified: Secondary | ICD-10-CM | POA: Insufficient documentation

## 2015-10-20 DIAGNOSIS — W07XXXA Fall from chair, initial encounter: Secondary | ICD-10-CM | POA: Insufficient documentation

## 2015-10-20 DIAGNOSIS — Y999 Unspecified external cause status: Secondary | ICD-10-CM | POA: Insufficient documentation

## 2015-10-20 DIAGNOSIS — Y929 Unspecified place or not applicable: Secondary | ICD-10-CM | POA: Insufficient documentation

## 2015-10-20 DIAGNOSIS — S0091XA Abrasion of unspecified part of head, initial encounter: Secondary | ICD-10-CM | POA: Insufficient documentation

## 2015-10-20 DIAGNOSIS — Z7984 Long term (current) use of oral hypoglycemic drugs: Secondary | ICD-10-CM | POA: Insufficient documentation

## 2015-10-20 DIAGNOSIS — F17210 Nicotine dependence, cigarettes, uncomplicated: Secondary | ICD-10-CM | POA: Insufficient documentation

## 2015-10-20 DIAGNOSIS — S22080A Wedge compression fracture of T11-T12 vertebra, initial encounter for closed fracture: Secondary | ICD-10-CM | POA: Insufficient documentation

## 2015-10-20 DIAGNOSIS — S12091A Other nondisplaced fracture of first cervical vertebra, initial encounter for closed fracture: Secondary | ICD-10-CM | POA: Insufficient documentation

## 2015-10-20 DIAGNOSIS — R0602 Shortness of breath: Secondary | ICD-10-CM | POA: Insufficient documentation

## 2015-10-20 DIAGNOSIS — I10 Essential (primary) hypertension: Secondary | ICD-10-CM | POA: Insufficient documentation

## 2015-10-20 LAB — COMPREHENSIVE METABOLIC PANEL
ALK PHOS: 77 U/L (ref 38–126)
ALT: 50 U/L (ref 14–54)
AST: 48 U/L — ABNORMAL HIGH (ref 15–41)
Albumin: 4.1 g/dL (ref 3.5–5.0)
Anion gap: 6 (ref 5–15)
BILIRUBIN TOTAL: 0.3 mg/dL (ref 0.3–1.2)
BUN: 13 mg/dL (ref 6–20)
CALCIUM: 9.6 mg/dL (ref 8.9–10.3)
CO2: 31 mmol/L (ref 22–32)
CREATININE: 0.79 mg/dL (ref 0.44–1.00)
Chloride: 98 mmol/L — ABNORMAL LOW (ref 101–111)
GFR calc non Af Amer: >60 mL/min (ref >60)
Glucose, Bld: 163 mg/dL — ABNORMAL HIGH (ref 65–99)
Potassium: 4 mmol/L (ref 3.5–5.1)
SODIUM: 135 mmol/L (ref 135–145)
TOTAL PROTEIN: 7.7 g/dL (ref 6.5–8.1)

## 2015-10-20 LAB — CBC WITH DIFFERENTIAL/PLATELET
Basophils Absolute: 0 10*3/uL (ref 0.0–0.1)
Basophils Relative: 0 %
EOS ABS: 0.1 10*3/uL (ref 0.0–0.7)
Eosinophils Relative: 1 %
HEMATOCRIT: 39.5 % (ref 36.0–46.0)
HEMOGLOBIN: 13.6 g/dL (ref 12.0–15.0)
LYMPHS ABS: 1.3 10*3/uL (ref 0.7–4.0)
LYMPHS PCT: 13 %
MCH: 31.2 pg (ref 26.0–34.0)
MCHC: 34.4 g/dL (ref 30.0–36.0)
MCV: 90.6 fL (ref 78.0–100.0)
MONOS PCT: 7 %
Monocytes Absolute: 0.7 10*3/uL (ref 0.1–1.0)
NEUTROS ABS: 7.9 10*3/uL — AB (ref 1.7–7.7)
NEUTROS PCT: 79 %
Platelets: 126 10*3/uL — ABNORMAL LOW (ref 150–400)
RBC: 4.36 MIL/uL (ref 3.87–5.11)
RDW: 12.8 % (ref 11.5–15.5)
WBC: 10.1 10*3/uL (ref 4.0–10.5)

## 2015-10-20 LAB — I-STAT BETA HCG BLOOD, ED (MC, WL, AP ONLY): I-stat hCG, quantitative: <5 m[IU]/mL (ref <5)

## 2015-10-20 MED ORDER — HYDROMORPHONE HCL 2 MG/ML IJ SOLN
INTRAMUSCULAR | Status: AC
  Administered 2015-10-20: 09:00:00
  Filled 2015-10-20: qty 1

## 2015-10-20 MED ORDER — OXYCODONE-ACETAMINOPHEN 5-325 MG PO TABS: 1.0000 | ORAL_TABLET | ORAL | 0 refills | Status: AC | PRN

## 2015-10-20 MED ORDER — IPRATROPIUM-ALBUTEROL 0.5-2.5 (3) MG/3ML IN SOLN
3.0000 mL | Freq: Once | RESPIRATORY_TRACT | Status: AC
  Administered 2015-10-20: 3 mL via RESPIRATORY_TRACT
  Filled 2015-10-20: qty 3

## 2015-10-20 MED ORDER — HYDROMORPHONE HCL 1 MG/ML IJ SOLN
1.0000 mg | INTRAMUSCULAR | Status: DC | PRN
  Administered 2015-10-20: 1 mg via INTRAVENOUS
  Filled 2015-10-20: qty 1

## 2015-10-20 MED ORDER — HYDROMORPHONE HCL 1 MG/ML IJ SOLN: 1.0000 mg | Freq: Once | INTRAMUSCULAR | Status: DC

## 2015-10-20 MED ORDER — FENTANYL CITRATE (PF) 100 MCG/2ML IJ SOLN
50.0000 ug | Freq: Once | INTRAMUSCULAR | Status: AC
  Administered 2015-10-20: 50 ug via INTRAVENOUS
  Filled 2015-10-20: qty 2

## 2015-10-20 MED ORDER — LORAZEPAM 2 MG/ML IJ SOLN
0.5000 mg | Freq: Once | INTRAMUSCULAR | Status: AC | PRN
  Administered 2015-10-20: 0.5 mg via INTRAVENOUS
  Filled 2015-10-20: qty 1

## 2015-10-20 MED ORDER — METHADONE HCL 10 MG/ML PO CONC
100.0000 mg | Freq: Once | ORAL | Status: AC
Start: 2015-10-20 — End: 2015-10-20
  Administered 2015-10-20: 100 mg via ORAL
  Filled 2015-10-20: qty 10

## 2015-10-20 MED ORDER — METHADONE HCL 10 MG/ML PO CONC: 100.0000 mg | Freq: Every day | ORAL | Status: DC

## 2015-10-20 MED ORDER — LORAZEPAM 2 MG/ML IJ SOLN
0.5000 mg | Freq: Once | INTRAMUSCULAR | Status: AC
  Administered 2015-10-20: 0.5 mg via INTRAVENOUS

## 2015-10-20 NOTE — ED Notes (Signed)
[employer] called for [employer].

## 2015-10-20 NOTE — ED Notes (Signed)
Pt sleeping with snoring respirations. NAD noted.

## 2015-10-20 NOTE — ED Notes (Signed)
Bed: WA24 Expected date:  Expected time:  Means of arrival:  Comments: EMS 

## 2015-10-20 NOTE — ED Notes (Signed)
Pt left with daughter.  Pt was still sleepy but able to answer all questions and maintain ABC's.  No reaction to medication noted.

## 2015-10-20 NOTE — ED Notes (Signed)
Patient transported to CT 

## 2015-10-20 NOTE — ED Provider Notes (Signed)
  Physical Exam  BP 165/90 (BP Location: Left Arm)   Pulse 77   Temp 98.9 F (37.2 C) (Oral)   Resp 16   SpO2 96%   Patient taken in sign out from NP [facility]. Hx of chronic pain. Started on Neurontin last night. Reports a hx of sleep walking after Neurontin. She apparently fell asleep on the porch last night and fell of on her head suffering abrasions. CT/ X-rays ordered and pending.  Physical Exam  Constitutional: She is oriented to person, place, and time. She appears well-developed and well-nourished. No distress.  HENT:  Head: Normocephalic and atraumatic.  Abrasion to the head  Eyes: Conjunctivae are normal. No scleral icterus.  Neck:  Placed in Hard C-collar  Cardiovascular: Normal rate, regular rhythm and normal heart sounds.  Exam reveals no gallop and no friction rub.   No murmur heard. Pulmonary/Chest: Effort normal and breath sounds normal. No respiratory distress.  Abdominal: Soft. Bowel sounds are normal. She exhibits no distension and no mass. There is no tenderness. There is no guarding.  Musculoskeletal:  Abrasion to the L lower extremity    Neurological: She is alert and oriented to person, place, and time.  BL upper extremities 4/5 strength (  Skin: Skin is warm and dry. She is not diaphoretic.  Nursing note and vitals reviewed.   ED Course  Procedures   Clinical Course  Value Comment By Time   Patient with anterior C1 anterior ring  fracture. I spoke with Dr. Wynetta Emery about the fracture and asks that we get an MRI to r/o contusion of the spine. If negative, she may be discharged with [facility]. Arthor Captain, PA-C 08/03 7026   Patient MRI Negative for cord contusion. She does not seem to have any symptoms suggestive of this and is currently c/o her severe lower back pain. Arthor Captain, PA-C 08/03 1010   Patient with T 11 wedge compression fracture. SPoke with Dr. Bevely Palmer who reccomends a TLSO splint. Arthor Captain, PA-C 08/03 1105  DG Chest 2 View  (Reviewed) Arthor Captain, PA-C 08/03 1147   Patient with c1, t11 fracture. I have discussed the case with the neurologist on call. Dr. Bevely Palmer also reviewed images and MRI. The patient has been properly splinted and strong return precautions and home care instruction. The patient is on daily methadone, but does not have a pain mgmt doctor. I have reviewed the [place]. The patient has not received any controlled substances. I will d/c with percocet. F/u with Dr. Wynetta Emery in the office. Arthor Captain, PA-C 08/03 1633    MDM Patient with        Arthor Captain, PA-C 10/20/15 1637    Shon Baton, MD 10/30/15 2245

## 2015-10-20 NOTE — ED Provider Notes (Addendum)
[facility]-EMERGENCY DEPT Provider Note   CSN: [id number] Arrival date & time: 10/20/15  0503  First Provider Contact:  None       History   Chief Complaint Chief Complaint  Patient presents with  . Fall    HPI Mallory Medina is a 50 y.o. female.  This is a 50 year old female who reports that she started taking gabapentin last night and every time she takes gabapentin.  She hallucinates and sleepwalks.  She states she went out onto her porch to smoke a cigarette and must set down in a chair and fell asleep, she states she fell out of the chair, landing directly on her head.  Now having pain in her lower back and she has an abrasion to the anterior portion of her right shin      Past Medical History:  Diagnosis Date  . Back pain, chronic   . COPD (chronic obstructive pulmonary disease) (HCC)   . 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 Hepatitis C   . Hypertension   . IV drug abuse recovery   hx of    Patient Active Problem List   Diagnosis Date Noted  . Anxiety state, unspecified 11/30/2012  . Unspecified constipation 11/30/2012  . Hyperglycemia 11/28/2012  . Elevated blood pressure 11/28/2012  . COPD with acute exacerbation (HCC) 11/28/2012  . CAP (community acquired pneumonia) 11/28/2012  . Thrombocytopenia, unspecified (HCC) 11/28/2012  . Cigarette nicotine dependence with withdrawal 11/28/2012  . Narcotic dependence (HCC) 11/28/2012  . Acute respiratory failure (HCC) 11/27/2012    Past Surgical History:  Procedure Laterality Date  . BACK SURGERY    . EXTERNAL FIXATION ARM Left   . KNEE ARTHROSCOPY Bilateral   . TONSILLECTOMY    . TUBAL LIGATION      OB History    No data available       Home Medications    Prior to Admission medications   Medication Sig Start Date End Date Taking? Authorizing Provider  albuterol (PROVENTIL HFA;VENTOLIN HFA) 108 (90 BASE) MCG/ACT inhaler Inhale 2 puffs into the lungs every 6 (six) hours as needed  for wheezing. 12/01/12  Yes Renae Fickle, MD  METHADONE HCL PO Take 100 mg by mouth daily. Medication is in liquid form   Yes Historical Provider, MD  albuterol (PROVENTIL) (2.5 MG/3ML) 0.083% nebulizer solution Take 3 mLs (2.5 mg total) by nebulization every 6 (six) hours as needed for wheezing or shortness of breath. Patient not taking: Reported on 10/20/2015 12/01/12   Renae Fickle, MD  beclomethasone (QVAR) 40 MCG/ACT inhaler Inhale 2 puffs into the lungs 2 (two) times daily. Patient not taking: Reported on 03/17/2015 12/01/12   Renae Fickle, MD  DULoxetine (CYMBALTA) 20 MG capsule Take 1 capsule (20 mg total) by mouth daily. Patient not taking: Reported on 03/17/2015 12/01/12   Renae Fickle, MD  gabapentin (NEURONTIN) 300 MG capsule Take 600 mg by mouth 3 (three) times daily.    Historical Provider, MD  ipratropium (ATROVENT) 0.02 % nebulizer solution Take 2.5 mLs (500 mcg total) by nebulization 4 (four) times daily. Patient not taking: Reported on 10/20/2015 12/01/12   Renae Fickle, MD  methocarbamol (ROBAXIN) 500 MG tablet Take 1 tablet (500 mg total) by mouth every 8 (eight) hours as needed for muscle spasms. 10/24/15   Mercedes Camprubi-Soms, PA-C  oxyCODONE-acetaminophen (PERCOCET) 5-325 MG tablet Take 1-2 tablets by mouth every 4 (four) hours as needed. Patient taking differently: Take 1-2 tablets by mouth every 4 (  four) hours as needed for moderate pain.  10/20/15   Arthor Captain, PA-C    Family History Family History  Problem Relation Age of Onset  . Diabetes Mellitus II Mother     Social History Social History  Substance Use Topics  . Smoking status: Current Every Day Smoker    Packs/day: 1.00    Types: Cigarettes  . Smokeless tobacco: Never Used  . Alcohol use No     Allergies   Sulfa antibiotics   Review of Systems Review of Systems  Constitutional: Negative for fever.  Respiratory: Positive for shortness of breath. Negative for wheezing.   Musculoskeletal:  Positive for back pain and neck pain.  Skin: Positive for wound.  All other systems reviewed and are negative.    Physical Exam Updated Vital Signs BP 153/78 (BP Location: Left Arm)   Pulse 87   Temp 98.9 F (37.2 C) (Oral)   Resp 16   Ht 5\' 3"  (1.6 m)   Wt 81.6 kg   SpO2 94%   BMI 31.89 kg/m   Physical Exam  Constitutional: She appears well-developed and well-nourished.  HENT:  Head: Normocephalic.  Eyes: Pupils are equal, round, and reactive to light.  Neck: Normal range of motion.  Cardiovascular: Normal rate.   Pulmonary/Chest: Breath sounds normal. No respiratory distress. She exhibits tenderness.  Abdominal: Soft.  Musculoskeletal: Normal range of motion.  Neurological: She is alert.  Skin: Skin is warm.  Psychiatric: She has a normal mood and affect.  Vitals reviewed.    ED Treatments / Results  Labs (all labs ordered are listed, but only abnormal results are displayed) Labs Reviewed  CBC WITH DIFFERENTIAL/PLATELET - Abnormal; Notable for the following:       Result Value   Platelets 126 (*)    Neutro Abs 7.9 (*)    All other components within normal limits  COMPREHENSIVE METABOLIC PANEL - Abnormal; Notable for the following:    Chloride 98 (*)    Glucose, Bld 163 (*)    AST 48 (*)    All other components within normal limits  I-STAT BETA HCG BLOOD, ED ([facility], [facility], [facility] ONLY)    EKG  EKG Interpretation None       Radiology No results found.  Procedures Procedures (including critical care time)  Medications Ordered in ED Medications  fentaNYL (SUBLIMAZE) injection 50 mcg (50 mcg Intravenous Given 10/20/15 0527)  ipratropium-albuterol (DUONEB) 0.5-2.5 (3) MG/3ML nebulizer solution 3 mL (3 mLs Nebulization Given 10/20/15 0530)  LORazepam (ATIVAN) injection 0.5 mg (0.5 mg Intravenous Given 10/20/15 0834)  LORazepam (ATIVAN) injection 0.5 mg (0.5 mg Intravenous Given 10/20/15 0905)  HYDROmorphone (DILAUDID) 2 MG/ML injection (  Given 10/20/15 0908)    methadone (DOLOPHINE) 10 MG/ML solution 100 mg (100 mg Oral Given 10/20/15 1230)     Initial Impression / Assessment and Plan / ED Course  I have reviewed the triage vital signs and the nursing notes.  Pertinent labs & imaging results that were available during my care of the patient were reviewed by me and considered in my medical decision making (see chart for details).  Clinical Course  Value Comment By Time   Patient with anterior C1 anterior ring  fracture. I spoke with Dr. Wynetta Emery about the fracture and asks that we get an MRI to r/o contusion of the spine. If negative, she may be discharged with [facility]. Arthor Captain, PA-C 08/03 1478   Patient MRI Negative for cord contusion. She does not seem to  have any symptoms suggestive of this and is currently c/o her severe lower back pain. Arthor Captain, PA-C 08/03 1010   Patient with T 11 wedge compression fracture. SPoke with Dr. Bevely Palmer who reccomends a TLSO splint. Arthor Captain, PA-C 08/03 1105  DG Chest 2 View (Reviewed) Arthor Captain, PA-C 08/03 1147   Patient with c1, t11 fracture. I have discussed the case with the neurologist on call. Dr. Bevely Palmer also reviewed images and MRI. The patient has been properly splinted and strong return precautions and home care instruction. The patient is on daily methadone, but does not have a pain mgmt doctor. I have reviewed the [place]. The patient has not received any controlled substances. I will d/c with percocet. F/u with Dr. Wynetta Emery in the office. Arthor Captain, PA-C 08/03 1633       Final Clinical Impressions(s) / ED Diagnoses   Final diagnoses:  Other closed nondisplaced fracture of first cervical vertebra, initial encounter [facility])  Closed wedge compression fracture of eleventh thoracic vertebra, initial encounter [facility])    New Prescriptions Discharge Medication List as of 10/20/2015  1:17 PM    START taking these medications   Details  oxyCODONE-acetaminophen (PERCOCET) 5-325 MG tablet  Take 1-2 tablets by mouth every 4 (four) hours as needed., Starting Thu 10/20/2015, Print         Earley Favor, NP 10/21/15 2031    Earley Favor, NP 10/21/15 1610    Shon Baton, MD 10/30/15 2246    Earley Favor, NP 12/16/15 9604    Shon Baton, MD 12/22/15 2152

## 2015-10-20 NOTE — Discharge Instructions (Signed)
Return to the emergency department for any new weakness, numbness, difficulty breathing, or new concerns.

## 2015-10-20 NOTE — ED Notes (Addendum)
Pt's daughter, Meriel Flavors, cell: [phone].  Pt has given permission to call daughter to give update on her status.

## 2015-10-20 NOTE — ED Triage Notes (Signed)
Per EMS, Patient called ems because she had fallen and she was having back pain. No LOC. Patient fell off the front porch (40feet high was the front porch).

## 2015-10-20 NOTE — ED Notes (Signed)
Pt returned back from imaging.

## 2015-10-20 NOTE — ED Notes (Addendum)
MRI called and inform this RN that pt was awake and stated she could not do the scan.  Pt given 0.5 mg of ativan.  Pt then stated she could not do the scan b/c she was in too much pain to lie flat for 30 minutes.  Abigail, PA called and came to the MRI.  Abigail gave order to give 1mg  of dilaudid but decided at 2mg  was more appropriate for pt as well as another 0.5mg  of Ativan.  2 mg of dilaudid and 1mg  of ativan given to pt in total and pt was able to lie flat for MRI scan. Pulse Ox placed on pt.  RN left MRI to return to ED.

## 2015-10-20 NOTE — ED Notes (Signed)
Patient transported to MRI 

## 2015-10-20 NOTE — ED Notes (Addendum)
Pt asked if she was claustrophobic and pt states she is.  Pt requesting medications to help with that.  Pt currently unable to stay awake and states she is sleepy.  Pt asked is she still needed medications for the MRI scan since she was so sleepy.  Pt states she still does.  Abigal, PA made aware.  MRI made aware.  Will continue to assess need for anti-anxiety medication d/t pt's inability to stay away at this time. MRI staff made aware.

## 2015-10-24 ENCOUNTER — Emergency Department (HOSPITAL_COMMUNITY)
Admission: EM | Admit: 2015-10-24 | Discharge: 2015-10-24 | Disposition: A | Discharge status: Home / Self Care | Payer: Self-pay | Attending: Emergency Medicine | Admitting: Emergency Medicine

## 2015-10-24 ENCOUNTER — Encounter (HOSPITAL_COMMUNITY): Payer: Self-pay | Admitting: Emergency Medicine

## 2015-10-24 DIAGNOSIS — G8929 Other chronic pain: Secondary | ICD-10-CM | POA: Insufficient documentation

## 2015-10-24 DIAGNOSIS — M542 Cervicalgia: Secondary | ICD-10-CM

## 2015-10-24 DIAGNOSIS — E119 Type 2 diabetes mellitus without complications: Secondary | ICD-10-CM | POA: Insufficient documentation

## 2015-10-24 DIAGNOSIS — Z79899 Other long term (current) drug therapy: Secondary | ICD-10-CM | POA: Insufficient documentation

## 2015-10-24 DIAGNOSIS — F17210 Nicotine dependence, cigarettes, uncomplicated: Secondary | ICD-10-CM | POA: Insufficient documentation

## 2015-10-24 DIAGNOSIS — J449 Chronic obstructive pulmonary disease, unspecified: Secondary | ICD-10-CM | POA: Insufficient documentation

## 2015-10-24 DIAGNOSIS — M546 Pain in thoracic spine: Secondary | ICD-10-CM | POA: Insufficient documentation

## 2015-10-24 DIAGNOSIS — I10 Essential (primary) hypertension: Secondary | ICD-10-CM | POA: Insufficient documentation

## 2015-10-24 DIAGNOSIS — W19XXXD Unspecified fall, subsequent encounter: Secondary | ICD-10-CM | POA: Insufficient documentation

## 2015-10-24 MED ORDER — KETOROLAC TROMETHAMINE 30 MG/ML IJ SOLN
30.0000 mg | Freq: Once | INTRAMUSCULAR | Status: AC
  Administered 2015-10-24: 30 mg via INTRAMUSCULAR
  Filled 2015-10-24: qty 1

## 2015-10-24 MED ORDER — METHOCARBAMOL 500 MG PO TABS: 500.0000 mg | ORAL_TABLET | Freq: Three times a day (TID) | ORAL | 0 refills | Status: AC | PRN

## 2015-10-24 MED ORDER — METHOCARBAMOL 1000 MG/10ML IJ SOLN
1000.0000 mg | Freq: Once | INTRAMUSCULAR | Status: AC
  Administered 2015-10-24: 1000 mg via INTRAMUSCULAR
  Filled 2015-10-24: qty 10

## 2015-10-24 MED ORDER — OXYCODONE-ACETAMINOPHEN 5-325 MG PO TABS
2.0000 | ORAL_TABLET | Freq: Once | ORAL | Status: AC
  Administered 2015-10-24: 2 via ORAL
  Filled 2015-10-24: qty 2

## 2015-10-24 NOTE — Progress Notes (Addendum)
ED CM consulted by ED PA/NP mercedes stating pt needs to be seen by neurosurgeon Dr Wynetta Emeryram (Cm notes in [facility] an ED referral was made on 10/20/15 for the pt to Dr Wynetta Emeryram) and not pcp Cm sent at referral to [facility] staff, Stacy to inquire if pt eligible for [facility] orange card pcp and if possible any neurosurgery slots for Dr Wynetta Emeryram Otherwise pt needs to be referred back to staff at Dr Lonie Peakram's office for assistance

## 2015-10-24 NOTE — ED Triage Notes (Addendum)
Pt fell 4 days ago and was diagnosed with a cervical spine fracture. Pt has been using C-collar, back brace, and percocet, yet still having pain.

## 2015-10-24 NOTE — Progress Notes (Signed)
ED CM spoke with Liborio NixonJanice at Dr Wynetta Emeryram office to see what pt may need to be seen by Dr Wynetta Emeryram Cm confirmed if she had called Dr Wynetta Emeryram office she would have been informed that she would need as an uninsured pt to make an appt and bring $200 with her for the first appt to be seen No notes found indicating pt has called Dr Wynetta Emeryram office for an upcoming appt There is a note indicating she had attempted to make an appt with Dr Yetta BarreJones in November 2016 but called to cancel the appt  ED CM updated ED NP/PA

## 2015-10-24 NOTE — Discharge Instructions (Signed)
Continue taking your home pain medications for pain. Use tylenol and aleve/motrin for additional relief. Use robaxin as directed for spasms, but don't drive while taking robaxin. Use heat to areas of pain to help with pain relief. Take your gabapentin as well, but decrease to 1 capsule three times daily. [facility] to establish care with them, and get set up with the orange card. Follow up with the neurosurgeon for ongoing management of your back/neck injuries. Return to the ER for changes or worsening symptoms

## 2015-10-24 NOTE — ED Provider Notes (Signed)
[facility]-EMERGENCY DEPT Provider Note   CSN: [id number] Arrival date & time: 10/24/15  1020  First Provider Contact:  First MD Initiated Contact with Patient 10/24/15 1159        History   Chief Complaint Chief Complaint  Patient presents with  . Generalized Body Aches    HPI Mallory Medina is a 50 y.o. female with a PMHx of chronic back pain, COPD, DM2, HepC, HTN, and prior IVDU, who presents to the ED with complaints of ongoing uncontrolled pain related to her recent C2 and T11 fractures that were diagnosed at her ER visit on 10/20/15, related to a mechanical fall. She describes her pain as 7/10 constant dull and throbbing and occasionally sharp pain radiating from her thoracic spine into her neck, worse with movement, relieved somewhat with Aleve and Xanax for which she does not have a prescription, and unrelieved by the Percocet that were given to her at her prior ED visit. She is a chronic methadone user, and states that even the methadone is not helping. She states that she called Dr. Lonie Peak office and was told that she needed to pay up front before they would schedule her, and she reports that she cannot afford to do so. She has no PCP and does not have insurance.  She denies any fevers, chills, chest pain, shortness breath, abdominal pain, nausea vomiting, diarrhea, constipation, dysuria, hematuria, cauda equina symptoms or saddle anesthesia, new numbness or tingling, or focal weakness. She states that last night she couldn't get up fast enough to go to the restroom and had an episode of functional incontinence, but denies that this incontinence was related to inability to hold her bladder.   The history is provided by the patient and medical records. No language interpreter was used.    Past Medical History:  Diagnosis Date  . Back pain, chronic   . COPD (chronic obstructive pulmonary disease) (HCC)   . 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 Hepatitis C   .  Hypertension   . IV drug abuse recovery   hx of    Patient Active Problem List   Diagnosis Date Noted  . Anxiety state, unspecified 11/30/2012  . Unspecified constipation 11/30/2012  . Hyperglycemia 11/28/2012  . Elevated blood pressure 11/28/2012  . COPD with acute exacerbation (HCC) 11/28/2012  . CAP (community acquired pneumonia) 11/28/2012  . Thrombocytopenia, unspecified (HCC) 11/28/2012  . Cigarette nicotine dependence with withdrawal 11/28/2012  . Narcotic dependence (HCC) 11/28/2012  . Acute respiratory failure (HCC) 11/27/2012    Past Surgical History:  Procedure Laterality Date  . BACK SURGERY    . EXTERNAL FIXATION ARM Left   . KNEE ARTHROSCOPY Bilateral   . TONSILLECTOMY    . TUBAL LIGATION      OB History    No data available       Home Medications    Prior to Admission medications   Medication Sig Start Date End Date Taking? Authorizing Provider  albuterol (PROVENTIL HFA;VENTOLIN HFA) 108 (90 BASE) MCG/ACT inhaler Inhale 2 puffs into the lungs every 6 (six) hours as needed for wheezing. 12/01/12   Renae Fickle, MD  albuterol (PROVENTIL) (2.5 MG/3ML) 0.083% nebulizer solution Take 3 mLs (2.5 mg total) by nebulization every 6 (six) hours as needed for wheezing or shortness of breath. Patient not taking: Reported on 10/20/2015 12/01/12   Renae Fickle, MD  Alum & Mag Hydroxide-Simeth (MAGIC MOUTHWASH W/LIDOCAINE) SOLN Take 1 mL by mouth 3 (three) times  daily as needed for mouth pain. Patient not taking: Reported on 03/17/2015 07/06/13   Susy Frizzle, MD  amoxicillin (AMOXIL) 500 MG capsule Take 2 capsules (1,000 mg total) by mouth 3 (three) times daily. Patient not taking: Reported on 10/20/2015 03/17/15   Cathren Laine, MD  beclomethasone (QVAR) 40 MCG/ACT inhaler Inhale 2 puffs into the lungs 2 (two) times daily. Patient not taking: Reported on 03/17/2015 12/01/12   Renae Fickle, MD  DULoxetine (CYMBALTA) 20 MG capsule Take 1 capsule (20 mg total) by  mouth daily. Patient not taking: Reported on 03/17/2015 12/01/12   Renae Fickle, MD  ipratropium (ATROVENT) 0.02 % nebulizer solution Take 2.5 mLs (500 mcg total) by nebulization 4 (four) times daily. Patient not taking: Reported on 10/20/2015 12/01/12   Renae Fickle, MD  METHADONE HCL PO Take 100 mg by mouth daily. Medication is in liquid form    Historical Provider, MD  methocarbamol (ROBAXIN-750) 750 MG tablet Take 1 tablet (750 mg total) by mouth 4 (four) times daily. Patient not taking: Reported on 03/17/2015 01/06/15   Lorre Nick, MD  nicotine (NICODERM CQ - DOSED IN MG/24 HOURS) 21 mg/24hr patch Place 1 patch onto the skin daily. Patient not taking: Reported on 10/20/2015 12/01/12   Renae Fickle, MD  oxyCODONE-acetaminophen (PERCOCET) 5-325 MG tablet Take 1-2 tablets by mouth every 4 (four) hours as needed. 10/20/15   Arthor Captain, PA-C  oxymetazoline (AFRIN) 0.05 % nasal spray Place 1 spray into the nose 2 (two) times daily as needed for congestion (do not use more than 3 consecutive days.). Patient not taking: Reported on 10/20/2015 12/01/12   Renae Fickle, MD    Family History Family History  Problem Relation Age of Onset  . Diabetes Mellitus II Mother     Social History Social History  Substance Use Topics  . Smoking status: Current Every Day Smoker    Packs/day: 1.00    Types: Cigarettes  . Smokeless tobacco: Never Used  . Alcohol use No     Allergies   Sulfa antibiotics   Review of Systems Review of Systems  Constitutional: Negative for chills and fever.  Respiratory: Negative for shortness of breath.   Cardiovascular: Negative for chest pain.  Gastrointestinal: Negative for abdominal pain, constipation, diarrhea, nausea and vomiting.  Genitourinary: Negative for difficulty urinating, dysuria and hematuria.  Musculoskeletal: Positive for back pain, myalgias and neck pain. Negative for arthralgias.  Skin: Negative for color change.  Allergic/Immunologic:  Negative for immunocompromised state.  Neurological: Negative for weakness and numbness.  Psychiatric/Behavioral: Negative for confusion.   10 Systems reviewed and are negative for acute change except as noted in the HPI.   Physical Exam Updated Vital Signs BP 147/87   Pulse 92   Temp 98.1 F (36.7 C) (Oral)   Resp 16   SpO2 93%   Physical Exam  Constitutional: She is oriented to person, place, and time. Vital signs are normal. She appears well-developed and well-nourished.  Non-toxic appearance. No distress. Cervical collar in place.  Afebrile, nontoxic, NAD, C-collar and TLSO brace in place  HENT:  Head: Normocephalic and atraumatic.  Mouth/Throat: Oropharynx is clear and moist and mucous membranes are normal.  Eyes: Conjunctivae and EOM are normal. Right eye exhibits no discharge. Left eye exhibits no discharge.  Neck: Spinous process tenderness and muscular tenderness present. Decreased range of motion (due to being in C-collar) present.  Diffuse tenderness in the C-spine both midline and in the paracervical muscles, some spasms palpable, C-collar in place therefore  ROM unable to be performed  Cardiovascular: Normal rate, regular rhythm, normal heart sounds and intact distal pulses.  Exam reveals no gallop and no friction rub.   No murmur heard. Pulmonary/Chest: Effort normal and breath sounds normal. No respiratory distress. She has no decreased breath sounds. She has no wheezes. She has no rhonchi. She has no rales.  Abdominal: Soft. Normal appearance and bowel sounds are normal. She exhibits no distension. There is no tenderness. There is no rigidity, no rebound, no guarding, no CVA tenderness, no tenderness at McBurney's point and negative Murphy's sign.  Musculoskeletal:       Thoracic back: She exhibits decreased range of motion (in TLSO brace), tenderness, bony tenderness and spasm. She exhibits no deformity.  MAE x4 Strength and sensation grossly intact in all  extremities Distal pulses intact Gait steady, although slow due to pain TLSO brace and C-collar in place T-spine with diffuse midline and paraspinous muscle TTP, no bony stepoffs or deformities, +paraspinous muscle spasms palpable which are exquisitely TTP. Limited ROM due to TLSO brace  Neurological: She is alert and oriented to person, place, and time. She has normal strength. No sensory deficit.  Skin: Skin is warm and dry. Abrasion noted. No rash noted.  Abrasions to BLE, healing  Psychiatric: She has a normal mood and affect.  Nursing note and vitals reviewed.    ED Treatments / Results  Labs (all labs ordered are listed, but only abnormal results are displayed) Labs Reviewed - No data to display  EKG  EKG Interpretation None       Radiology No results found. Dg Chest 2 View  Result Date: 10/20/2015 CLINICAL DATA:  Status post fall from a porch earlier today and is complaining of upper, mid, and lower back pain. History of COPD, community-acquired pneumonia current smoker. EXAM: CHEST  2 VIEW COMPARISON:  Chest x-ray of August 30, 2014 FINDINGS: The lungs are less well inflated today. The interstitial markings remain coarse. There are no alveolar infiltrates. There is no pleural effusion. The cardiac silhouette is mildly enlarged. The pulmonary vascularity is prominent centrally. There is no pleural effusion. There is 40% anterior and 10% posterior wedge compression of the body of T11 which is new since a study of June 2016. IMPRESSION: 1. New T11 wedge compression fracture. 2. Mild cardiomegaly with central pulmonary vascular congestion. No pneumothorax or alveolar pneumonia. Electronically Signed   By: David  [place] M.D.   On: 10/20/2015 07:11   Dg Lumbar Spine 2-3 Views  Result Date: 10/20/2015 CLINICAL DATA:  Status post fall from porch this morning with persistent upper mid and low back pain. EXAM: LUMBAR SPINE - 2-3 VIEW COMPARISON:  CT scan of the lumbar spine of June 06, 2007 FINDINGS: The patient has undergone previous lumbar fusion at L4-5 and L5-S1. The fusion hardware appears intact. There is 40% anterior and 10% posterior wedge compression of the body of T11 which is new since a chest x-ray of August 30, 2014. The lumbar vertebral bodies are preserved in height. There is disc space narrowing at T12-L1. There is no spondylolisthesis. The pedicles and transverse processes of L1 through L3 are intact. The observed portions of the sacrum exhibit no acute abnormalities. IMPRESSION: Post surgical changes of the lower lumbar spine. No acute bony abnormality. T11 wedge compression which is new since June of 2016. Electronically Signed   By: David  [place] M.D.   On: 10/20/2015 07:14   Ct Head Wo Contrast  Result Date: 10/20/2015 CLINICAL DATA:  50 year old female with fall EXAM: CT HEAD WITHOUT CONTRAST CT CERVICAL SPINE WITHOUT CONTRAST TECHNIQUE: Multidetector CT imaging of the head and cervical spine was performed following the standard protocol without intravenous contrast. Multiplanar CT image reconstructions of the cervical spine were also generated. COMPARISON:  CT dated 01/06/2015 FINDINGS: CT HEAD FINDINGS The ventricles and the sulci are appropriate in size for the patient's age. There is no intracranial hemorrhage. No midline shift or mass effect identified. The gray-white matter differentiation is preserved. The visualized paranasal sinuses and mastoid air cells are well aerated. The calvarium is intact. Small left parietal scalp hematoma. CT CERVICAL SPINE FINDINGS There is a nondisplaced acute fracture of the anterior inferior corner of the C2, extension teardrop fracture. No other acute fracture identified. Nondisplaced old fractures of bilateral posterior archs of C1. There is interval healing of the previously seen anterior C1 fracture. There is degenerative changes of the cervical spine most prominent at C5-C6 with there is disc space narrowing and endplate  irregularity. There is irregularity of the superior endplate of the T3 vertebral with Schmorl nodes. The spinous prostheses and the odontoid appear intact. There is anatomic alignment of the lateral masses of C1 and C2. The soft tissues appear unremarkable. IMPRESSION: No acute intracranial pathology. Nondisplaced acute fracture of the anterior inferior corner of the C2 vertebra. Old nondisplaced fractures of the posterior arches of C1. Interval healing of the previously seen anterior C1 fracture. These results were called by telephone at the time of interpretation on 10/20/2015 at 6:39 am to Dr. Wilkie Aye, who verbally acknowledged these results. Electronically Signed   By: Elgie Collard M.D.   On: 10/20/2015 06:39   Ct Cervical Spine Wo Contrast  Result Date: 10/20/2015 CLINICAL DATA:  50 year old female with fall EXAM: CT HEAD WITHOUT CONTRAST CT CERVICAL SPINE WITHOUT CONTRAST TECHNIQUE: Multidetector CT imaging of the head and cervical spine was performed following the standard protocol without intravenous contrast. Multiplanar CT image reconstructions of the cervical spine were also generated. COMPARISON:  CT dated 01/06/2015 FINDINGS: CT HEAD FINDINGS The ventricles and the sulci are appropriate in size for the patient's age. There is no intracranial hemorrhage. No midline shift or mass effect identified. The gray-white matter differentiation is preserved. The visualized paranasal sinuses and mastoid air cells are well aerated. The calvarium is intact. Small left parietal scalp hematoma. CT CERVICAL SPINE FINDINGS There is a nondisplaced acute fracture of the anterior inferior corner of the C2, extension teardrop fracture. No other acute fracture identified. Nondisplaced old fractures of bilateral posterior archs of C1. There is interval healing of the previously seen anterior C1 fracture. There is degenerative changes of the cervical spine most prominent at C5-C6 with there is disc space narrowing and  endplate irregularity. There is irregularity of the superior endplate of the T3 vertebral with Schmorl nodes. The spinous prostheses and the odontoid appear intact. There is anatomic alignment of the lateral masses of C1 and C2. The soft tissues appear unremarkable. IMPRESSION: No acute intracranial pathology. Nondisplaced acute fracture of the anterior inferior corner of the C2 vertebra. Old nondisplaced fractures of the posterior arches of C1. Interval healing of the previously seen anterior C1 fracture. These results were called by telephone at the time of interpretation on 10/20/2015 at 6:39 am to Dr. Wilkie Aye, who verbally acknowledged these results. Electronically Signed   By: Elgie Collard M.D.   On: 10/20/2015 06:39   Mr Cervical Spine Wo Contrast  Result Date: 10/20/2015 CLINICAL DATA:  50 year old female post fall  from porch this morning. C2 fracture on CT. Evaluate for possible cord bruising. Subsequent encounter. EXAM: MRI CERVICAL SPINE WITHOUT CONTRAST TECHNIQUE: Multiplanar, multisequence MR imaging of the cervical spine was performed. No intravenous contrast was administered. COMPARISON:  10/20/2015 and 01/06/2015 CT of the cervical spine. No comparison MR. FINDINGS: Exam is significantly motion degraded despite maximal sedation. Alignment: Relatively normal. Vertebrae: Fracture of the anterior inferior aspect of the C2 vertebra noted on recent CT not as well delineated on the present MR. Remote C1 ring fracture not well delineated on present MR. Prominent discogenic changes C5-6. Remote Schmorl's node deformity/compression deformity superior endplate T3. Cord: Evaluation markedly limited by motion without obvious cord abnormality. If the patient is presenting as if there is a cord contusion and further evaluation is clinically desired, MR under general anesthesia would be necessary. Posterior Fossa, vertebral arteries, paraspinal tissues: Small amount of prevertebral edema/ hemorrhage extends from  C2 to C4-5 disc space. There may be minimal edema surrounding the C2-3 facet joints indicating injury with findings more notable on the left extending into adjacent paraspinal musculature. Disc levels: Evaluation limited. C2-3: Bulge with narrowing ventral thecal sac. C3-4: Bulge with narrowing ventral thecal sac and minimal cord contact. C4-5: Left paracentral calcified disc osteophyte complex with flattening of the left aspect of the cord. C5-6: Broad-based disc osteophyte complex. Moderate focal disc osteophyte complex right paracentral position with flattening of the cord. Moderate foraminal narrowing greater on the right. C6-7:  Bulge.  Narrowing ventral thecal sac. C7-T1:  Minimal bulge. IMPRESSION: Evaluation markedly limited by motion without obvious cord abnormality. If the patient is presenting as if there is a cord contusion and further evaluation is clinically desired, MR under general anesthesia would be necessary. Fracture of the anterior inferior aspect of the C2 vertebra noted on recent CT not as well delineated on the present MR. Small amount of prevertebral edema/ hemorrhage extends from C2 to C4-5 disc space. There may be minimal edema surrounding the C2-3 facet joints indicating injury with findings more notable on the left extending into adjacent paraspinal musculature. Remote C1 ring fracture not well delineated on present MR. Remote Schmorl's node deformity/compression deformity superior endplate T3. Evaluation of discogenic changes limited with findings suggesting: C2-3 bulge with narrowing ventral thecal sac. C3-4 bulge with narrowing ventral thecal sac and minimal cord contact. C4-5 left paracentral calcified disc osteophyte complex with flattening of the left aspect of the cord. C5-6 broad-based disc osteophyte complex. Moderate focal disc osteophyte complex right paracentral position with flattening of the cord. Moderate foraminal narrowing greater on the right. C6-7 bulge.  Narrowing  ventral thecal sac. Electronically Signed   By: Lacy Duverney M.D.   On: 10/20/2015 09:48    Procedures Procedures (including critical care time)  Medications Ordered in ED Medications  methocarbamol (ROBAXIN) injection 1,000 mg (1,000 mg Intramuscular Given 10/24/15 1306)  ketorolac (TORADOL) 30 MG/ML injection 30 mg (30 mg Intramuscular Given 10/24/15 1306)  oxyCODONE-acetaminophen (PERCOCET/ROXICET) 5-325 MG per tablet 2 tablet (2 tablets Oral Given 10/24/15 1306)     Initial Impression / Assessment and Plan / ED Course  I have reviewed the triage vital signs and the nursing notes.  Pertinent labs & imaging results that were available during my care of the patient were reviewed by me and considered in my medical decision making (see chart for details).  Clinical Course    50 y.o. female here with uncontrolled pain from a C1/2 and T11 fracture that was diagnosed on 10/20/15. Attempted to call  Dr. Wynetta Emeryram (neurosurgery) for follow up but was told she needed up front payment and can't afford it. On exam, tenderness to the C-spine and T-spine diffusely, paraspinous muscle tenderness and spasms. States she took a xanax (does not have rx) which helped, wonders whether muscle relaxer will help some. Able to ambulate without assistance. NVI in all extremities. Discussed with SW who stated they would put in a referral to the people involved with the orange card to see if they had any avenues to help her with, but that there wasn't much else to do. Will see if we can control pain with robaxin/toradol IM and percocet PO here. Pain control will be difficult here due to the fact that she is a chronic methadone user. If unable to control pain, may need admission.   2:10 PM  SW Cala BradfordKimberly reported to me that pt never called Dr. Lonie Peakram's office, she reached out to their office and was told it would just be a $200 up front fee and then they would see her. SW spoke to pt regarding this information, and is agreeable to  that plan. Pt feeling better, able to ambulate quicker and better. Will have her use home pain meds for pain, rx robaxin for spasms, discussed tylenol/motrin use, and heat use. F/up with [facility] to get established and get orange card, and f/up with Dr. Wynetta Emeryram for ongoing management of her injuries. I explained the diagnosis and have given explicit precautions to return to the ER including for any other new or worsening symptoms. The patient understands and accepts the medical plan as it's been dictated and I have answered their questions. Discharge instructions concerning home care and prescriptions have been given. The patient is STABLE and is discharged to home in good condition.    Final Clinical Impressions(s) / ED Diagnoses   Final diagnoses:  Chronic pain  Neck pain, acute  Midline thoracic back pain    New Prescriptions New Prescriptions   METHOCARBAMOL (ROBAXIN) 500 MG TABLET    Take 1 tablet (500 mg total) by mouth every 8 (eight) hours as needed for muscle spasms.     Brittany Osier Camprubi-Soms, PA-C 10/24/15 1411    Shaune Pollackameron Isaacs, MD 10/25/15 1114

## 2016-07-07 ENCOUNTER — Emergency Department (HOSPITAL_COMMUNITY)
Admission: EM | Admit: 2016-07-07 | Discharge: 2016-07-08 | Disposition: A | Discharge status: Home / Self Care | Payer: Self-pay | Attending: Emergency Medicine | Admitting: Emergency Medicine

## 2016-07-07 ENCOUNTER — Emergency Department (HOSPITAL_COMMUNITY): Payer: Self-pay

## 2016-07-07 ENCOUNTER — Encounter (HOSPITAL_COMMUNITY): Payer: Self-pay

## 2016-07-07 DIAGNOSIS — Z79899 Other long term (current) drug therapy: Secondary | ICD-10-CM | POA: Insufficient documentation

## 2016-07-07 DIAGNOSIS — I10 Essential (primary) hypertension: Secondary | ICD-10-CM | POA: Insufficient documentation

## 2016-07-07 DIAGNOSIS — E119 Type 2 diabetes mellitus without complications: Secondary | ICD-10-CM | POA: Insufficient documentation

## 2016-07-07 DIAGNOSIS — F17210 Nicotine dependence, cigarettes, uncomplicated: Secondary | ICD-10-CM | POA: Insufficient documentation

## 2016-07-07 DIAGNOSIS — J441 Chronic obstructive pulmonary disease with (acute) exacerbation: Secondary | ICD-10-CM | POA: Insufficient documentation

## 2016-07-07 LAB — CBC
HCT: 39.4 % (ref 36.0–46.0)
HEMOGLOBIN: 13.5 g/dL (ref 12.0–15.0)
MCH: 31 pg (ref 26.0–34.0)
MCHC: 34.3 g/dL (ref 30.0–36.0)
MCV: 90.6 fL (ref 78.0–100.0)
PLATELETS: 116 10*3/uL — AB (ref 150–400)
RBC: 4.35 MIL/uL (ref 3.87–5.11)
RDW: 12 % (ref 11.5–15.5)
WBC: 5.1 10*3/uL (ref 4.0–10.5)

## 2016-07-07 LAB — BASIC METABOLIC PANEL
ANION GAP: 8 (ref 5–15)
BUN: 9 mg/dL (ref 6–20)
CALCIUM: 9.7 mg/dL (ref 8.9–10.3)
CHLORIDE: 96 mmol/L — AB (ref 101–111)
CO2: 32 mmol/L (ref 22–32)
CREATININE: 0.97 mg/dL (ref 0.44–1.00)
GFR calc non Af Amer: >60 mL/min (ref >60)
Glucose, Bld: 102 mg/dL — ABNORMAL HIGH (ref 65–99)
Potassium: 3.9 mmol/L (ref 3.5–5.1)
SODIUM: 136 mmol/L (ref 135–145)

## 2016-07-07 LAB — TROPONIN I

## 2016-07-07 MED ORDER — METHYLPREDNISOLONE SODIUM SUCC 125 MG IJ SOLR
125.0000 mg | Freq: Once | INTRAMUSCULAR | Status: AC
  Administered 2016-07-07: 125 mg via INTRAVENOUS
  Filled 2016-07-07: qty 2

## 2016-07-07 MED ORDER — ALBUTEROL SULFATE (2.5 MG/3ML) 0.083% IN NEBU
INHALATION_SOLUTION | RESPIRATORY_TRACT | Status: AC
  Administered 2016-07-07: 5 mg
  Filled 2016-07-07: qty 6

## 2016-07-07 MED ORDER — MAGNESIUM SULFATE 2 GM/50ML IV SOLN
2.0000 g | Freq: Once | INTRAVENOUS | Status: AC
  Administered 2016-07-07: 2 g via INTRAVENOUS
  Filled 2016-07-07: qty 50

## 2016-07-07 MED ORDER — ALBUTEROL (5 MG/ML) CONTINUOUS INHALATION SOLN
10.0000 mg/h | INHALATION_SOLUTION | Freq: Once | RESPIRATORY_TRACT | Status: AC
  Administered 2016-07-07: 10 mg/h via RESPIRATORY_TRACT
  Filled 2016-07-07: qty 20

## 2016-07-07 MED ORDER — LORAZEPAM 2 MG/ML IJ SOLN
1.0000 mg | Freq: Once | INTRAMUSCULAR | Status: AC
Start: 2016-07-07 — End: 2016-07-07
  Administered 2016-07-07: 1 mg via INTRAVENOUS
  Filled 2016-07-07: qty 1

## 2016-07-07 MED ORDER — IPRATROPIUM BROMIDE 0.02 % IN SOLN
1.0000 mg | Freq: Once | RESPIRATORY_TRACT | Status: AC
  Administered 2016-07-07: 1 mg via RESPIRATORY_TRACT
  Filled 2016-07-07: qty 5

## 2016-07-07 NOTE — ED Provider Notes (Signed)
[facility] DEPT Provider Note   CSN: [id number] Arrival date & time: 07/07/16  2029     History   Chief Complaint Chief Complaint  Patient presents with  . Shortness of Breath  . Chest Pain    HPI Mallory Medina is a 51 y.o. female.  HPI 51 yo F with h/o DM, HTN, Hep C, COPD here with SOB. Pt states that for the past week she has had progressively worsening cough, SOB, and yellow-green sputum production. She has had associated worsening chest pain with coughing that is sharp, worse with coughing but not present at rest. She has been using home nebs withotu improvement. She has had chills but no known fevers, as well as nausea and poor appetite. Her coughing has exacerbated her chronic pain and she hurts "all over." Denies any alleviating factors.  Past Medical History:  Diagnosis Date  . Back pain, chronic   . COPD (chronic obstructive pulmonary disease) (HCC)   . 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 Hepatitis C   . Hypertension   . IV drug abuse recovery   hx of    Patient Active Problem List   Diagnosis Date Noted  . Anxiety state, unspecified 11/30/2012  . Unspecified constipation 11/30/2012  . Hyperglycemia 11/28/2012  . Elevated blood pressure 11/28/2012  . COPD with acute exacerbation (HCC) 11/28/2012  . CAP (community acquired pneumonia) 11/28/2012  . Thrombocytopenia, unspecified (HCC) 11/28/2012  . Cigarette nicotine dependence with withdrawal 11/28/2012  . Narcotic dependence (HCC) 11/28/2012  . Acute respiratory failure (HCC) 11/27/2012    Past Surgical History:  Procedure Laterality Date  . BACK SURGERY    . EXTERNAL FIXATION ARM Left   . KNEE ARTHROSCOPY Bilateral   . TONSILLECTOMY    . TUBAL LIGATION      OB History    No data available       Home Medications    Prior to Admission medications   Medication Sig Start Date End Date Taking? Authorizing Provider  albuterol (PROVENTIL) (2.5 MG/3ML) 0.083% nebulizer  solution Take 3 mLs (2.5 mg total) by nebulization every 6 (six) hours as needed for wheezing or shortness of breath. 12/01/12  Yes Renae Fickle, MD  diphenhydramine-acetaminophen (TYLENOL PM) 25-500 MG TABS tablet Take 3 tablets by mouth at bedtime as needed (sleep).   Yes Historical Provider, MD  METHADONE HCL PO Take 120 mg by mouth daily. liquid   Yes Historical Provider, MD  albuterol (PROVENTIL HFA;VENTOLIN HFA) 108 (90 BASE) MCG/ACT inhaler Inhale 2 puffs into the lungs every 6 (six) hours as needed for wheezing. Patient not taking: Reported on 07/07/2016 12/01/12   Renae Fickle, MD  beclomethasone (QVAR) 40 MCG/ACT inhaler Inhale 2 puffs into the lungs 2 (two) times daily. Patient not taking: Reported on 03/17/2015 12/01/12   Renae Fickle, MD  benzonatate (TESSALON) 200 MG capsule Take 1 capsule (200 mg total) by mouth 3 (three) times daily as needed for cough. 07/08/16 07/15/16  Shaune Pollack, MD  doxycycline (VIBRAMYCIN) 100 MG capsule Take 1 capsule (100 mg total) by mouth 2 (two) times daily. 07/08/16 07/15/16  Shaune Pollack, MD  DULoxetine (CYMBALTA) 20 MG capsule Take 1 capsule (20 mg total) by mouth daily. Patient not taking: Reported on 03/17/2015 12/01/12   Renae Fickle, MD  ipratropium (ATROVENT) 0.02 % nebulizer solution Take 2.5 mLs (500 mcg total) by nebulization 4 (four) times daily. Patient not taking: Reported on 10/20/2015 12/01/12   Renae Fickle, MD  methocarbamol (  ROBAXIN) 500 MG tablet Take 1 tablet (500 mg total) by mouth every 8 (eight) hours as needed for muscle spasms. Patient not taking: Reported on 07/07/2016 10/24/15   Mercedes Street, PA-C  oxyCODONE-acetaminophen (PERCOCET) 5-325 MG tablet Take 1-2 tablets by mouth every 4 (four) hours as needed. Patient not taking: Reported on 07/07/2016 10/20/15   Arthor Captain, PA-C  predniSONE (DELTASONE) 20 MG tablet Take 3 tablets (60 mg total) by mouth daily. 07/08/16 07/13/16  Shaune Pollack, MD    Family History Family  History  Problem Relation Age of Onset  . Diabetes Mellitus II Mother     Social History Social History  Substance Use Topics  . Smoking status: Current Every Day Smoker    Packs/day: 1.00    Types: Cigarettes  . Smokeless tobacco: Never Used  . Alcohol use No     Allergies   Sulfa antibiotics   Review of Systems Review of Systems  Constitutional: Positive for fatigue. Negative for chills and fever.  HENT: Negative for congestion and rhinorrhea.   Eyes: Negative for visual disturbance.  Respiratory: Positive for cough, shortness of breath and wheezing.   Cardiovascular: Positive for chest pain. Negative for leg swelling.  Gastrointestinal: Negative for abdominal pain, diarrhea, nausea and vomiting.  Genitourinary: Negative for dysuria and flank pain.  Musculoskeletal: Negative for neck pain and neck stiffness.  Skin: Negative for rash and wound.  Allergic/Immunologic: Negative for immunocompromised state.  Neurological: Positive for light-headedness. Negative for syncope, weakness and headaches.  All other systems reviewed and are negative.    Physical Exam Updated Vital Signs BP 112/82   Pulse 86   Temp 97.8 F (36.6 C) (Oral)   Resp 12   Ht  (1.6 m)   Wt 175 lb (79.4 kg)   SpO2 97%   BMI 31.00 kg/m   Physical Exam  Constitutional: She is oriented to person, place, and time. She appears well-developed and well-nourished. She appears distressed.  HENT:  Head: Normocephalic and atraumatic.  Eyes: Conjunctivae are normal.  Neck: Neck supple.  Cardiovascular: Normal rate, regular rhythm and normal heart sounds.  Exam reveals no friction rub.   No murmur heard. Pulmonary/Chest: Tachypnea noted. No respiratory distress. She has decreased breath sounds. She has wheezes in the right upper field, the right middle field, the right lower field, the left upper field, the left middle field and the left lower field. She has rhonchi in the right lower field and the  left lower field. She has no rales.  Abdominal: She exhibits no distension.  Musculoskeletal: She exhibits no edema.  Neurological: She is alert and oriented to person, place, and time. She exhibits normal muscle tone.  Skin: Skin is warm. Capillary refill takes less than 2 seconds.  Psychiatric: She has a normal mood and affect.  Nursing note and vitals reviewed.    ED Treatments / Results  Labs (all labs ordered are listed, but only abnormal results are displayed) Labs Reviewed  BASIC METABOLIC PANEL - Abnormal; Notable for the following:       Result Value   Chloride 96 (*)    Glucose, Bld 102 (*)    All other components within normal limits  CBC - Abnormal; Notable for the following:    Platelets 116 (*)    All other components within normal limits  TROPONIN I    EKG  EKG Interpretation  Date/Time:  Saturday July 07 2016 20:37:20 EDT Ventricular Rate:  71 PR Interval:  178 QRS Duration:  86 QT Interval:  434 QTC Calculation: 471 R Axis:   73 Text Interpretation:  Normal sinus rhythm Nonspecific T wave abnormality Abnormal ECG No significant change since last tracing Confirmed by Erykah Lippert MD, Carrieann Spielberg [phone] on 07/07/2016 10:56:18 PM       Radiology Dg Chest 2 View  Result Date: 07/07/2016 CLINICAL DATA:  Shortness of breath with mid sternal chest pain EXAM: CHEST  2 VIEW COMPARISON:  10/20/2015 FINDINGS: Mild diffuse coarsening of the lung interstitium. No focal infiltrate or effusion. Stable cardiomediastinal silhouette with atherosclerosis. No pneumothorax. Slight progression of T11 compression deformity now approximate 75% loss of height anteriorly. Mild focal kyphosis at this level. Partially visualized lumbar spine hardware. IMPRESSION: 1. No acute infiltrate or edema 2. Slight interval progression/loss of height of T11 vertebral body since the prior study. Electronically Signed   By: Jasmine Pang M.D.   On: 07/07/2016 21:09    Procedures Procedures (including  critical care time)  Medications Ordered in ED Medications  albuterol (PROVENTIL) (2.5 MG/3ML) 0.083% nebulizer solution (5 mg  Given 07/07/16 2049)  albuterol (PROVENTIL,VENTOLIN) solution continuous neb (10 mg/hr Nebulization Given 07/07/16 2321)  ipratropium (ATROVENT) nebulizer solution 1 mg (1 mg Nebulization Given 07/07/16 2321)  LORazepam (ATIVAN) injection 1 mg (1 mg Intravenous Given 07/07/16 2310)  methylPREDNISolone sodium succinate (SOLU-MEDROL) 125 mg/2 mL injection 125 mg (125 mg Intravenous Given 07/07/16 2315)  magnesium sulfate IVPB 2 g 50 mL (0 g Intravenous Stopped 07/08/16 0216)  albuterol (PROVENTIL HFA;VENTOLIN HFA) 108 (90 Base) MCG/ACT inhaler 2 puff (2 puffs Inhalation Given 07/08/16 0132)     Initial Impression / Assessment and Plan / ED Course  I have reviewed the triage vital signs and the nursing notes.  Pertinent labs & imaging results that were available during my care of the patient were reviewed by me and considered in my medical decision making (see chart for details).  51 yo F with h/o DM, HTN, Hep C, COPD here with SOB. On arrival, pt has significant diffuse wheezing. History, exam is c/w COPD exacerbation. CXR without PNA. Will give breathing tx, fluids, obtain labs. No LE edema or signs of DVT or PE. EKG non-ischemic and trop neg despite sx >1 day - doubt ACS.  Labs reassuring. Pt feels markedly improved after ivF and breathing tx. She is ambulatory without difficulty >1 hour after nebs and is satting well on RA with ambulation. She is tolerating PO. VSS. Will d/c with tx for COPD, close f/u. Encouraged fluids at home. Good return precautions given.  Final Clinical Impressions(s) / ED Diagnoses   Final diagnoses:  COPD exacerbation (HCC)    New Prescriptions Discharge Medication List as of 07/08/2016  1:15 AM    START taking these medications   Details  benzonatate (TESSALON) 200 MG capsule Take 1 capsule (200 mg total) by mouth 3 (three) times daily as  needed for cough., Starting Sun 07/08/2016, Until Sun 07/15/2016, Print    doxycycline (VIBRAMYCIN) 100 MG capsule Take 1 capsule (100 mg total) by mouth 2 (two) times daily., Starting Sun 07/08/2016, Until Sun 07/15/2016, Print    predniSONE (DELTASONE) 20 MG tablet Take 3 tablets (60 mg total) by mouth daily., Starting Sun 07/08/2016, Until Fri 07/13/2016, Print         Shaune Pollack, MD 07/08/16 1504

## 2016-07-07 NOTE — ED Triage Notes (Signed)
Pt endorses shortness of breath and chest pain that has been going on "for a while" increasingly worse. Pt is out of her inhaler. Rhonchi and wheezing in all fields. Pt able to speak in complete sentences but breathing is labored. VSS.

## 2016-07-08 MED ORDER — DOXYCYCLINE HYCLATE 100 MG PO CAPS: 100.0000 mg | ORAL_CAPSULE | Freq: Two times a day (BID) | ORAL | 0 refills | Status: AC

## 2016-07-08 MED ORDER — ALBUTEROL SULFATE HFA 108 (90 BASE) MCG/ACT IN AERS
2.0000 | INHALATION_SPRAY | Freq: Once | RESPIRATORY_TRACT | Status: AC
  Administered 2016-07-08: 2 via RESPIRATORY_TRACT
  Filled 2016-07-08: qty 6.7

## 2016-07-08 MED ORDER — BENZONATATE 200 MG PO CAPS: 200.0000 mg | ORAL_CAPSULE | Freq: Three times a day (TID) | ORAL | 0 refills | Status: AC | PRN

## 2016-07-08 MED ORDER — PREDNISONE 20 MG PO TABS: 60.0000 mg | ORAL_TABLET | Freq: Every day | ORAL | 0 refills | Status: AC

## 2016-07-08 NOTE — ED Notes (Signed)
Pt's O2 stayed between 92-96% while ambulating

## 2016-09-15 ENCOUNTER — Encounter (HOSPITAL_COMMUNITY): Payer: Self-pay

## 2016-09-15 ENCOUNTER — Emergency Department (HOSPITAL_COMMUNITY)
Admission: EM | Admit: 2016-09-15 | Discharge: 2016-09-16 | Disposition: A | Discharge status: Home / Self Care | Payer: Self-pay | Attending: Emergency Medicine | Admitting: Emergency Medicine

## 2016-09-15 ENCOUNTER — Emergency Department (HOSPITAL_COMMUNITY): Payer: Self-pay

## 2016-09-15 DIAGNOSIS — R0602 Shortness of breath: Secondary | ICD-10-CM | POA: Insufficient documentation

## 2016-09-15 DIAGNOSIS — Z5321 Procedure and treatment not carried out due to patient leaving prior to being seen by health care provider: Secondary | ICD-10-CM | POA: Insufficient documentation

## 2016-09-15 LAB — CBC
HCT: 39.8 % (ref 36.0–46.0)
Hemoglobin: 13.9 g/dL (ref 12.0–15.0)
MCH: 31 pg (ref 26.0–34.0)
MCHC: 34.9 g/dL (ref 30.0–36.0)
MCV: 88.8 fL (ref 78.0–100.0)
Platelets: 80 10*3/uL — ABNORMAL LOW (ref 150–400)
RBC: 4.48 MIL/uL (ref 3.87–5.11)
RDW: 12.3 % (ref 11.5–15.5)
WBC: 4.4 10*3/uL (ref 4.0–10.5)

## 2016-09-15 LAB — BASIC METABOLIC PANEL
Anion gap: 7 (ref 5–15)
BUN: 7 mg/dL (ref 6–20)
CALCIUM: 9 mg/dL (ref 8.9–10.3)
CO2: 31 mmol/L (ref 22–32)
CREATININE: 0.87 mg/dL (ref 0.44–1.00)
Chloride: 98 mmol/L — ABNORMAL LOW (ref 101–111)
GFR calc Af Amer: >60 mL/min (ref >60)
GLUCOSE: 98 mg/dL (ref 65–99)
Potassium: 3.9 mmol/L (ref 3.5–5.1)
Sodium: 136 mmol/L (ref 135–145)

## 2016-09-15 MED ORDER — ALBUTEROL SULFATE (2.5 MG/3ML) 0.083% IN NEBU
5.0000 mg | INHALATION_SOLUTION | Freq: Once | RESPIRATORY_TRACT | Status: AC
  Administered 2016-09-15: 5 mg via RESPIRATORY_TRACT
  Filled 2016-09-15: qty 6

## 2016-09-15 NOTE — ED Triage Notes (Signed)
Smokes 1/2 ppd and hx of COPD for 2 days increasing shortness of breath with productive cough yellowish/green color no fever noted able to speak in full sentences.

## 2016-09-16 NOTE — ED Notes (Signed)
Pt left d/t wait time

## 2016-10-20 ENCOUNTER — Emergency Department (HOSPITAL_COMMUNITY): Payer: Medicaid Other

## 2016-10-20 ENCOUNTER — Encounter (HOSPITAL_COMMUNITY): Payer: Self-pay

## 2016-10-20 ENCOUNTER — Emergency Department (HOSPITAL_COMMUNITY)
Admission: EM | Admit: 2016-10-20 | Discharge: 2016-10-20 | Disposition: A | Discharge status: Home / Self Care | Payer: Medicaid Other | Attending: Emergency Medicine | Admitting: Emergency Medicine

## 2016-10-20 ENCOUNTER — Other Ambulatory Visit: Payer: Self-pay

## 2016-10-20 DIAGNOSIS — Z79899 Other long term (current) drug therapy: Secondary | ICD-10-CM | POA: Diagnosis not present

## 2016-10-20 DIAGNOSIS — E119 Type 2 diabetes mellitus without complications: Secondary | ICD-10-CM | POA: Insufficient documentation

## 2016-10-20 DIAGNOSIS — R0602 Shortness of breath: Secondary | ICD-10-CM | POA: Diagnosis present

## 2016-10-20 DIAGNOSIS — M549 Dorsalgia, unspecified: Secondary | ICD-10-CM | POA: Insufficient documentation

## 2016-10-20 DIAGNOSIS — F17210 Nicotine dependence, cigarettes, uncomplicated: Secondary | ICD-10-CM | POA: Diagnosis not present

## 2016-10-20 DIAGNOSIS — J441 Chronic obstructive pulmonary disease with (acute) exacerbation: Secondary | ICD-10-CM | POA: Diagnosis not present

## 2016-10-20 DIAGNOSIS — I10 Essential (primary) hypertension: Secondary | ICD-10-CM | POA: Diagnosis not present

## 2016-10-20 LAB — COMPREHENSIVE METABOLIC PANEL
ALBUMIN: 3.5 g/dL (ref 3.5–5.0)
ALK PHOS: 78 U/L (ref 38–126)
ALT: 38 U/L (ref 14–54)
ANION GAP: 11 (ref 5–15)
AST: 38 U/L (ref 15–41)
BILIRUBIN TOTAL: 2.8 mg/dL — AB (ref 0.3–1.2)
BUN: 10 mg/dL (ref 6–20)
CALCIUM: 9.5 mg/dL (ref 8.9–10.3)
CO2: 29 mmol/L (ref 22–32)
CREATININE: 0.96 mg/dL (ref 0.44–1.00)
Chloride: 88 mmol/L — ABNORMAL LOW (ref 101–111)
GFR calc Af Amer: >60 mL/min (ref >60)
GFR calc non Af Amer: >60 mL/min (ref >60)
GLUCOSE: 231 mg/dL — AB (ref 65–99)
Potassium: 4.3 mmol/L (ref 3.5–5.1)
SODIUM: 128 mmol/L — AB (ref 135–145)
Total Protein: 7.8 g/dL (ref 6.5–8.1)

## 2016-10-20 LAB — CBC WITH DIFFERENTIAL/PLATELET
BASOS ABS: 0 10*3/uL (ref 0.0–0.1)
Basophils Relative: 0 %
EOS ABS: 0 10*3/uL (ref 0.0–0.7)
EOS PCT: 0 %
HEMATOCRIT: 41.3 % (ref 36.0–46.0)
Hemoglobin: 13.8 g/dL (ref 12.0–15.0)
Lymphocytes Relative: 5 %
Lymphs Abs: 0.9 10*3/uL (ref 0.7–4.0)
MCH: 30 pg (ref 26.0–34.0)
MCHC: 33.4 g/dL (ref 30.0–36.0)
MCV: 89.8 fL (ref 78.0–100.0)
MONO ABS: 0.9 10*3/uL (ref 0.1–1.0)
MONOS PCT: 5 %
NEUTROS ABS: 16.3 10*3/uL — AB (ref 1.7–7.7)
Neutrophils Relative %: 90 %
PLATELETS: 105 10*3/uL — AB (ref 150–400)
RBC: 4.6 MIL/uL (ref 3.87–5.11)
RDW: 13.4 % (ref 11.5–15.5)
WBC: 18 10*3/uL — ABNORMAL HIGH (ref 4.0–10.5)

## 2016-10-20 LAB — TROPONIN I: Troponin I: <0.03 ng/mL (ref <0.03)

## 2016-10-20 LAB — BRAIN NATRIURETIC PEPTIDE: B Natriuretic Peptide: 30.7 pg/mL (ref 0.0–100.0)

## 2016-10-20 MED ORDER — PREDNISONE 20 MG PO TABS
60.0000 mg | ORAL_TABLET | Freq: Once | ORAL | Status: AC
  Administered 2016-10-20: 60 mg via ORAL
  Filled 2016-10-20: qty 3

## 2016-10-20 MED ORDER — AZITHROMYCIN 250 MG PO TABS
500.0000 mg | ORAL_TABLET | Freq: Once | ORAL | Status: AC
  Administered 2016-10-20: 500 mg via ORAL
  Filled 2016-10-20: qty 2

## 2016-10-20 MED ORDER — METHYLPREDNISOLONE SODIUM SUCC 125 MG IJ SOLR
125.0000 mg | Freq: Once | INTRAMUSCULAR | Status: DC
  Filled 2016-10-20: qty 2

## 2016-10-20 MED ORDER — AZITHROMYCIN 250 MG PO TABS: 250.0000 mg | ORAL_TABLET | Freq: Every day | ORAL | 0 refills | Status: AC

## 2016-10-20 MED ORDER — IPRATROPIUM-ALBUTEROL 0.5-2.5 (3) MG/3ML IN SOLN
3.0000 mL | Freq: Once | RESPIRATORY_TRACT | Status: AC
  Administered 2016-10-20: 3 mL via RESPIRATORY_TRACT
  Filled 2016-10-20: qty 3

## 2016-10-20 MED ORDER — PREDNISONE 20 MG PO TABS: 40.0000 mg | ORAL_TABLET | Freq: Every day | ORAL | 0 refills | Status: AC

## 2016-10-20 MED ORDER — ALBUTEROL SULFATE HFA 108 (90 BASE) MCG/ACT IN AERS
2.0000 | INHALATION_SPRAY | Freq: Once | RESPIRATORY_TRACT | Status: AC
  Administered 2016-10-20: 2 via RESPIRATORY_TRACT
  Filled 2016-10-20: qty 6.7

## 2016-10-20 MED ORDER — ALBUTEROL SULFATE (2.5 MG/3ML) 0.083% IN NEBU
5.0000 mg | INHALATION_SOLUTION | Freq: Once | RESPIRATORY_TRACT | Status: AC
  Administered 2016-10-20: 5 mg via RESPIRATORY_TRACT
  Filled 2016-10-20: qty 6

## 2016-10-20 MED ORDER — ALBUTEROL (5 MG/ML) CONTINUOUS INHALATION SOLN
10.0000 mg/h | INHALATION_SOLUTION | RESPIRATORY_TRACT | Status: DC
  Administered 2016-10-20: 10 mg/h via RESPIRATORY_TRACT
  Filled 2016-10-20: qty 20

## 2016-10-20 NOTE — ED Notes (Signed)
Pt stable and ambulatory for discharge states understanding prescriptions

## 2016-10-20 NOTE — ED Notes (Signed)
Patient transported to X-ray 

## 2016-10-20 NOTE — Discharge Instructions (Signed)
As discussed, with your COPD it is important that you monitor your condition carefully, and obtain a primary care physician to help you monitor your illness.  For the next 2 days please use the provided albuterol inhaler every 4 hours.  You also need to obtain the prescribed steroids and antibiotics, and to take these as directed.  Return here for concerning changes in your condition.

## 2016-10-20 NOTE — ED Notes (Signed)
ED Provider at bedside. 

## 2016-10-20 NOTE — ED Triage Notes (Signed)
Pt presents to the ed with complaints of pain in her upper back with shortness of breath and a productive cough x 3 days. Pt reports having pneumonia before and this feeling the same.

## 2016-10-20 NOTE — ED Provider Notes (Signed)
[facility] Provider Note   CSN: [id number] Arrival date & time: 10/20/16  1026     History   Chief Complaint Chief Complaint  Patient presents with  . Shortness of Breath  . Back Pain    HPI Mallory Medina is a 51 y.o. female.  HPI  Patient with multiple medical issues include COPD, hepatitis C and no access to primary care presents with concern of dyspnea, back pain. She notes that the symptoms are similar to those she experienced on multiple prior occasions, typically associated with pneumonia. This illness began about 3 days ago, since onset has been worsening, with no relief from anything. Patient has no medication to attempt intervention. Though the patient continues to smoke, she states that she is trying to quit. She describes subjective fever, no confusion, no disorientation, no anterior chest pain.  Past Medical History:  Diagnosis Date  . Back pain, chronic   . COPD (chronic obstructive pulmonary disease) (HCC)   . 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 Hepatitis C   . Hypertension   . IV drug abuse recovery   hx of    Patient Active Problem List   Diagnosis Date Noted  . Anxiety state, unspecified 11/30/2012  . Unspecified constipation 11/30/2012  . Hyperglycemia 11/28/2012  . Elevated blood pressure 11/28/2012  . COPD with acute exacerbation (HCC) 11/28/2012  . CAP (community acquired pneumonia) 11/28/2012  . Thrombocytopenia, unspecified (HCC) 11/28/2012  . Cigarette nicotine dependence with withdrawal 11/28/2012  . Narcotic dependence (HCC) 11/28/2012  . Acute respiratory failure (HCC) 11/27/2012    Past Surgical History:  Procedure Laterality Date  . BACK SURGERY    . EXTERNAL FIXATION ARM Left   . KNEE ARTHROSCOPY Bilateral   . TONSILLECTOMY    . TUBAL LIGATION      OB History    No data available       Home Medications    Prior to Admission medications   Medication Sig Start Date End Date Taking?  Authorizing Provider  albuterol (PROVENTIL HFA;VENTOLIN HFA) 108 (90 BASE) MCG/ACT inhaler Inhale 2 puffs into the lungs every 6 (six) hours as needed for wheezing. Patient not taking: Reported on 07/07/2016 12/01/12   Renae FickleShort, Mackenzie, MD  albuterol (PROVENTIL) (2.5 MG/3ML) 0.083% nebulizer solution Take 3 mLs (2.5 mg total) by nebulization every 6 (six) hours as needed for wheezing or shortness of breath. Patient not taking: Reported on 09/15/2016 12/01/12   Renae FickleShort, Mackenzie, MD  beclomethasone (QVAR) 40 MCG/ACT inhaler Inhale 2 puffs into the lungs 2 (two) times daily. Patient not taking: Reported on 03/17/2015 12/01/12   Renae FickleShort, Mackenzie, MD  diphenhydramine-acetaminophen (TYLENOL PM) 25-500 MG TABS tablet Take 1 tablet by mouth at bedtime as needed (sleep).    [provider]  DULoxetine (CYMBALTA) 20 MG capsule Take 1 capsule (20 mg total) by mouth daily. Patient not taking: Reported on 03/17/2015 12/01/12   Renae FickleShort, Mackenzie, MD  ipratropium (ATROVENT) 0.02 % nebulizer solution Take 2.5 mLs (500 mcg total) by nebulization 4 (four) times daily. Patient not taking: Reported on 10/20/2015 12/01/12   Renae FickleShort, Mackenzie, MD  METHADONE HCL PO Take 120 mg by mouth daily. liquid    [provider]  methocarbamol (ROBAXIN) 500 MG tablet Take 1 tablet (500 mg total) by mouth every 8 (eight) hours as needed for muscle spasms. Patient not taking: Reported on 07/07/2016 10/24/15   Street, UnityMercedes, PA-C  oxyCODONE-acetaminophen (PERCOCET) 5-325 MG tablet Take 1-2 tablets by mouth  every 4 (four) hours as needed. Patient not taking: Reported on 07/07/2016 10/20/15   Arthor CaptainHarris, Abigail, PA-C    Family History Family History  Problem Relation Age of Onset  . Diabetes Mellitus II Mother     Social History Social History  Substance Use Topics  . Smoking status: Current Every Day Smoker    Packs/day: 1.00    Types: Cigarettes  . Smokeless tobacco: Never Used  . Alcohol use No     Allergies     Sulfa antibiotics   Review of Systems Review of Systems  Constitutional:       Per HPI, otherwise negative  HENT:       Per HPI, otherwise negative  Respiratory:       Per HPI, otherwise negative  Cardiovascular:       Per HPI, otherwise negative  Gastrointestinal: Negative for vomiting.  Endocrine:       Negative aside from HPI  Genitourinary:       Neg aside from HPI   Musculoskeletal:       Per HPI, otherwise negative  Skin: Negative.   Allergic/Immunologic: Positive for immunocompromised state.  Neurological: Positive for weakness. Negative for syncope.     Physical Exam Updated Vital Signs BP (!) 152/118   Pulse (!) 135   Temp 99.1 F (37.3 C) (Oral)   Resp (!) 26   Wt 79.4 kg (175 lb)   SpO2 94%   BMI 31.00 kg/m   Physical Exam  Constitutional: She is oriented to person, place, and time. She has a sickly appearance.  HENT:  Head: Normocephalic and atraumatic.  Eyes: Conjunctivae and EOM are normal.  Cardiovascular: Normal rate and regular rhythm.   Pulmonary/Chest: No stridor. Tachypnea noted. She has decreased breath sounds. She has wheezes.  Abdominal: She exhibits no distension.  Musculoskeletal: She exhibits no edema.  Neurological: She is alert and oriented to person, place, and time. No cranial nerve deficit.  Skin: Skin is warm and dry.  Psychiatric: She has a normal mood and affect.  Nursing note and vitals reviewed.    ED Treatments / Results  Labs (all labs ordered are listed, but only abnormal results are displayed) Labs Reviewed  COMPREHENSIVE METABOLIC PANEL  CBC WITH DIFFERENTIAL/PLATELET  TROPONIN I  BRAIN NATRIURETIC PEPTIDE    EKG  EKG Interpretation  Date/Time:  Saturday October 20 2016 10:37:47 EDT Ventricular Rate:  123 PR Interval:    QRS Duration: 84 QT Interval:  355 QTC Calculation: 508 R Axis:   63 Text Interpretation:  Sinus tachycardia Borderline repolarization abnormality Borderline prolonged QT interval  Artifact T wave abnormality Abnormal ekg Confirmed by Gerhard MunchLockwood, Azani Brogdon [phone]([id number]) on 10/20/2016 10:56:49 AM      With oxygen supplementation her saturation value is 97%, nasal cannula, abnormal   Radiology Dg Chest 2 View  Result Date: 10/20/2016 CLINICAL DATA:  51 year old female with upper back pain for several days as well as productive cough. EXAM: CHEST  2 VIEW COMPARISON:  Prior chest x-ray 09/15/2016 FINDINGS: The lungs are clear and negative for focal airspace consolidation, pulmonary edema or suspicious pulmonary nodule. Chronic bronchitic changes and interstitial prominence are similar compared to prior. No pleural effusion or pneumothorax. Cardiac and mediastinal contours are within normal limits. No acute fracture or lytic or blastic osseous lesions. Stable T11 compression fracture. The visualized upper abdominal bowel gas pattern is unremarkable. IMPRESSION: 1. Stable chest x-ray without evidence of acute cardiopulmonary process. 2. Stable chronic T11 compression fracture. Electronically Signed  By: Malachy Moan M.D.   On: 10/20/2016 11:23    Procedures Procedures (including critical care time)  Medications Ordered in ED Medications  albuterol (PROVENTIL,VENTOLIN) solution continuous neb (10 mg/hr Nebulization New Bag/Given 10/20/16 1525)  albuterol (PROVENTIL) (2.5 MG/3ML) 0.083% nebulizer solution 5 mg (5 mg Nebulization Given 10/20/16 1150)  predniSONE (DELTASONE) tablet 60 mg (60 mg Oral Given 10/20/16 1149)  ipratropium-albuterol (DUONEB) 0.5-2.5 (3) MG/3ML nebulizer solution 3 mL (3 mLs Nebulization Given 10/20/16 1358)     Initial Impression / Assessment and Plan / ED Course  I have reviewed the triage vital signs and the nursing notes.  Pertinent labs & imaging results that were available during my care of the patient were reviewed by me and considered in my medical decision making (see chart for details).     After the initial evaluation the patient received continued  oxygen supplementation, albuterol, steroids.   After the first 2 breathing treatments the patient improved, though she had mild persistent coarse breath sounds. Patient will receive a continuous hour-long session.    4:38 PM Patient appears substantially more calm, state that she feels substantially better.  She is tachycardic, but has received multiple albuterol sessions.  No chest pain, no ongoing cough. We had a lengthy conversation about her COPD, need for primary care monitoring, and for smoking cessation.   This patient with a history of COPD, ongoing smoking addiction presents with dyspnea. Here she is awake, alert, afebrile, with coarse breath sounds on initial exam. X-ray does not demonstrate pneumonia. Patient does have ongoing tachycardia, likely due at least in part to her ongoing albuterol sessions. Patient remains awake and alert and for hours of monitoring, has substantial improvement with multiple breathing treatments, steroids. Patient discharged with ongoing bronchodilator, steroids, and given her low-grade temperature elevation, she will also have a short course of azithromycin. Patient notes that she has no primary care physician, and she was provided resources to our wellness clinic.  Final Clinical Impressions(s) / ED Diagnoses  COPD exacerbation Smoking addiction   Gerhard Munch, MD 10/20/16 [phone]

## 2016-10-23 ENCOUNTER — Emergency Department (HOSPITAL_COMMUNITY): Payer: Medicaid Other

## 2016-10-23 ENCOUNTER — Inpatient Hospital Stay (HOSPITAL_COMMUNITY)
Admission: EM | Admit: 2016-10-23 | Discharge: 2016-11-17 | DRG: 870 | Disposition: E | Payer: Medicaid Other | Attending: Pulmonary Disease | Admitting: Pulmonary Disease

## 2016-10-23 ENCOUNTER — Inpatient Hospital Stay (HOSPITAL_COMMUNITY): Payer: Medicaid Other

## 2016-10-23 ENCOUNTER — Encounter (HOSPITAL_COMMUNITY): Payer: Self-pay | Admitting: Emergency Medicine

## 2016-10-23 DIAGNOSIS — K7200 Acute and subacute hepatic failure without coma: Secondary | ICD-10-CM | POA: Diagnosis not present

## 2016-10-23 DIAGNOSIS — B49 Unspecified mycosis: Secondary | ICD-10-CM | POA: Diagnosis present

## 2016-10-23 DIAGNOSIS — K047 Periapical abscess without sinus: Secondary | ICD-10-CM | POA: Diagnosis present

## 2016-10-23 DIAGNOSIS — I76 Septic arterial embolism: Secondary | ICD-10-CM | POA: Diagnosis present

## 2016-10-23 DIAGNOSIS — J189 Pneumonia, unspecified organism: Secondary | ICD-10-CM | POA: Diagnosis present

## 2016-10-23 DIAGNOSIS — K922 Gastrointestinal hemorrhage, unspecified: Secondary | ICD-10-CM | POA: Diagnosis present

## 2016-10-23 DIAGNOSIS — R0602 Shortness of breath: Secondary | ICD-10-CM | POA: Diagnosis present

## 2016-10-23 DIAGNOSIS — R34 Anuria and oliguria: Secondary | ICD-10-CM | POA: Diagnosis present

## 2016-10-23 DIAGNOSIS — G8929 Other chronic pain: Secondary | ICD-10-CM | POA: Diagnosis present

## 2016-10-23 DIAGNOSIS — F17210 Nicotine dependence, cigarettes, uncomplicated: Secondary | ICD-10-CM | POA: Diagnosis present

## 2016-10-23 DIAGNOSIS — E874 Mixed disorder of acid-base balance: Secondary | ICD-10-CM | POA: Diagnosis present

## 2016-10-23 DIAGNOSIS — I10 Essential (primary) hypertension: Secondary | ICD-10-CM | POA: Diagnosis present

## 2016-10-23 DIAGNOSIS — Z66 Do not resuscitate: Secondary | ICD-10-CM | POA: Diagnosis not present

## 2016-10-23 DIAGNOSIS — Z515 Encounter for palliative care: Secondary | ICD-10-CM | POA: Diagnosis present

## 2016-10-23 DIAGNOSIS — Z9114 Patient's other noncompliance with medication regimen: Secondary | ICD-10-CM | POA: Diagnosis not present

## 2016-10-23 DIAGNOSIS — R740 Nonspecific elevation of levels of transaminase and lactic acid dehydrogenase [LDH]: Secondary | ICD-10-CM

## 2016-10-23 DIAGNOSIS — N179 Acute kidney failure, unspecified: Secondary | ICD-10-CM | POA: Diagnosis present

## 2016-10-23 DIAGNOSIS — Z79891 Long term (current) use of opiate analgesic: Secondary | ICD-10-CM

## 2016-10-23 DIAGNOSIS — I2699 Other pulmonary embolism without acute cor pulmonale: Secondary | ICD-10-CM | POA: Diagnosis present

## 2016-10-23 DIAGNOSIS — J9601 Acute respiratory failure with hypoxia: Secondary | ICD-10-CM | POA: Diagnosis present

## 2016-10-23 DIAGNOSIS — Z7401 Bed confinement status: Secondary | ICD-10-CM

## 2016-10-23 DIAGNOSIS — I469 Cardiac arrest, cause unspecified: Secondary | ICD-10-CM | POA: Diagnosis present

## 2016-10-23 DIAGNOSIS — R6521 Severe sepsis with septic shock: Secondary | ICD-10-CM | POA: Diagnosis present

## 2016-10-23 DIAGNOSIS — Z9911 Dependence on respirator [ventilator] status: Secondary | ICD-10-CM

## 2016-10-23 DIAGNOSIS — E871 Hypo-osmolality and hyponatremia: Secondary | ICD-10-CM | POA: Diagnosis present

## 2016-10-23 DIAGNOSIS — B1920 Unspecified viral hepatitis C without hepatic coma: Secondary | ICD-10-CM | POA: Diagnosis present

## 2016-10-23 DIAGNOSIS — A4101 Sepsis due to Methicillin susceptible Staphylococcus aureus: Principal | ICD-10-CM | POA: Diagnosis present

## 2016-10-23 DIAGNOSIS — G9341 Metabolic encephalopathy: Secondary | ICD-10-CM | POA: Diagnosis present

## 2016-10-23 DIAGNOSIS — D689 Coagulation defect, unspecified: Secondary | ICD-10-CM | POA: Diagnosis present

## 2016-10-23 DIAGNOSIS — D696 Thrombocytopenia, unspecified: Secondary | ICD-10-CM | POA: Diagnosis not present

## 2016-10-23 DIAGNOSIS — K760 Fatty (change of) liver, not elsewhere classified: Secondary | ICD-10-CM | POA: Diagnosis present

## 2016-10-23 DIAGNOSIS — F1990 Other psychoactive substance use, unspecified, uncomplicated: Secondary | ICD-10-CM | POA: Diagnosis present

## 2016-10-23 DIAGNOSIS — Z882 Allergy status to sulfonamides status: Secondary | ICD-10-CM

## 2016-10-23 DIAGNOSIS — R7401 Elevation of levels of liver transaminase levels: Secondary | ICD-10-CM

## 2016-10-23 DIAGNOSIS — K920 Hematemesis: Secondary | ICD-10-CM | POA: Diagnosis not present

## 2016-10-23 DIAGNOSIS — K7460 Unspecified cirrhosis of liver: Secondary | ICD-10-CM | POA: Diagnosis present

## 2016-10-23 DIAGNOSIS — A419 Sepsis, unspecified organism: Secondary | ICD-10-CM

## 2016-10-23 DIAGNOSIS — E11649 Type 2 diabetes mellitus with hypoglycemia without coma: Secondary | ICD-10-CM | POA: Diagnosis present

## 2016-10-23 DIAGNOSIS — J440 Chronic obstructive pulmonary disease with acute lower respiratory infection: Secondary | ICD-10-CM | POA: Diagnosis present

## 2016-10-23 DIAGNOSIS — F419 Anxiety disorder, unspecified: Secondary | ICD-10-CM | POA: Diagnosis present

## 2016-10-23 DIAGNOSIS — Z833 Family history of diabetes mellitus: Secondary | ICD-10-CM

## 2016-10-23 DIAGNOSIS — D649 Anemia, unspecified: Secondary | ICD-10-CM | POA: Diagnosis not present

## 2016-10-23 LAB — I-STAT CHEM 8, ED
BUN: 45 mg/dL — ABNORMAL HIGH (ref 6–20)
Calcium, Ion: 1.11 mmol/L — ABNORMAL LOW (ref 1.15–1.40)
Chloride: 91 mmol/L — ABNORMAL LOW (ref 101–111)
Creatinine, Ser: 2 mg/dL — ABNORMAL HIGH (ref 0.44–1.00)
GLUCOSE: 117 mg/dL — AB (ref 65–99)
HEMATOCRIT: 62 % — AB (ref 36.0–46.0)
Hemoglobin: 21.1 g/dL (ref 12.0–15.0)
Potassium: 4.3 mmol/L (ref 3.5–5.1)
Sodium: 128 mmol/L — ABNORMAL LOW (ref 135–145)
TCO2: 19 mmol/L (ref 0–100)

## 2016-10-23 LAB — POCT I-STAT 3, ART BLOOD GAS (G3+)
Acid-base deficit: 17 mmol/L — ABNORMAL HIGH (ref 0.0–2.0)
BICARBONATE: 14.5 mmol/L — AB (ref 20.0–28.0)
O2 Saturation: 83 %
PCO2 ART: 56.7 mmHg — AB (ref 32.0–48.0)
PO2 ART: 69 mmHg — AB (ref 83.0–108.0)
Patient temperature: 98
TCO2: 16 mmol/L (ref 0–100)
pH, Arterial: 7.014 — CL (ref 7.350–7.450)

## 2016-10-23 LAB — I-STAT VENOUS BLOOD GAS, ED
Acid-base deficit: 16 mmol/L — ABNORMAL HIGH (ref 0.0–2.0)
Bicarbonate: 14.1 mmol/L — ABNORMAL LOW (ref 20.0–28.0)
O2 SAT: 100 %
PCO2 VEN: 48.3 mmHg (ref 44.0–60.0)
PO2 VEN: 271 mmHg — AB (ref 32.0–45.0)
Patient temperature: 98.6
TCO2: 16 mmol/L (ref 0–100)
pH, Ven: 7.072 — CL (ref 7.250–7.430)

## 2016-10-23 LAB — CBC WITH DIFFERENTIAL/PLATELET
BASOS ABS: 0.4 10*3/uL — AB (ref 0.0–0.1)
Basophils Relative: 2 %
EOS PCT: 0 %
Eosinophils Absolute: 0 10*3/uL (ref 0.0–0.7)
HCT: 53.8 % — ABNORMAL HIGH (ref 36.0–46.0)
HEMOGLOBIN: 18.8 g/dL — AB (ref 12.0–15.0)
LYMPHS PCT: 23 %
Lymphs Abs: 4.3 10*3/uL — ABNORMAL HIGH (ref 0.7–4.0)
MCH: 31 pg (ref 26.0–34.0)
MCHC: 34.9 g/dL (ref 30.0–36.0)
MCV: 88.8 fL (ref 78.0–100.0)
MONOS PCT: 9 %
Monocytes Absolute: 1.7 10*3/uL — ABNORMAL HIGH (ref 0.1–1.0)
NEUTROS ABS: 12.3 10*3/uL — AB (ref 1.7–7.7)
Neutrophils Relative %: 66 %
Platelets: 298 10*3/uL (ref 150–400)
RBC: 6.06 MIL/uL — AB (ref 3.87–5.11)
RDW: 13.4 % (ref 11.5–15.5)
WBC MORPHOLOGY: INCREASED
WBC: 18.7 10*3/uL — AB (ref 4.0–10.5)

## 2016-10-23 LAB — COMPREHENSIVE METABOLIC PANEL
ALK PHOS: 69 U/L (ref 38–126)
ALT: 28 U/L (ref 14–54)
AST: 86 U/L — AB (ref 15–41)
Albumin: 2.5 g/dL — ABNORMAL LOW (ref 3.5–5.0)
Anion gap: 29 — ABNORMAL HIGH (ref 5–15)
BUN: 36 mg/dL — ABNORMAL HIGH (ref 6–20)
CO2: 15 mmol/L — ABNORMAL LOW (ref 22–32)
Calcium: 10.6 mg/dL — ABNORMAL HIGH (ref 8.9–10.3)
Chloride: 84 mmol/L — ABNORMAL LOW (ref 101–111)
Creatinine, Ser: 2.42 mg/dL — ABNORMAL HIGH (ref 0.44–1.00)
GFR, EST AFRICAN AMERICAN: 26 mL/min — AB (ref >60)
GFR, EST NON AFRICAN AMERICAN: 22 mL/min — AB (ref >60)
GLUCOSE: 124 mg/dL — AB (ref 65–99)
Potassium: 4.8 mmol/L (ref 3.5–5.1)
SODIUM: 128 mmol/L — AB (ref 135–145)
TOTAL PROTEIN: 6.5 g/dL (ref 6.5–8.1)
Total Bilirubin: 2.3 mg/dL — ABNORMAL HIGH (ref 0.3–1.2)

## 2016-10-23 LAB — I-STAT ARTERIAL BLOOD GAS, ED
Acid-base deficit: 16 mmol/L — ABNORMAL HIGH (ref 0.0–2.0)
Bicarbonate: 11.2 mmol/L — ABNORMAL LOW (ref 20.0–28.0)
O2 SAT: 100 %
PH ART: 7.176 — AB (ref 7.350–7.450)
TCO2: 12 mmol/L (ref 0–100)
pCO2 arterial: 30.1 mmHg — ABNORMAL LOW (ref 32.0–48.0)
pO2, Arterial: 241 mmHg — ABNORMAL HIGH (ref 83.0–108.0)

## 2016-10-23 LAB — I-STAT CG4 LACTIC ACID, ED: LACTIC ACID, VENOUS: 16.58 mmol/L — AB (ref 0.5–1.9)

## 2016-10-23 LAB — URINALYSIS, ROUTINE W REFLEX MICROSCOPIC
BILIRUBIN URINE: NEGATIVE
Glucose, UA: NEGATIVE mg/dL
KETONES UR: NEGATIVE mg/dL
LEUKOCYTES UA: NEGATIVE
Nitrite: NEGATIVE
PROTEIN: 100 mg/dL — AB
Specific Gravity, Urine: 1.023 (ref 1.005–1.030)
pH: 5 (ref 5.0–8.0)

## 2016-10-23 LAB — I-STAT TROPONIN, ED: TROPONIN I, POC: 0.01 ng/mL (ref 0.00–0.08)

## 2016-10-23 LAB — PROTIME-INR
INR: 2.06
Prothrombin Time: 23.5 seconds — ABNORMAL HIGH (ref 11.4–15.2)

## 2016-10-23 MED ORDER — VANCOMYCIN HCL 10 G IV SOLR
1500.0000 mg | Freq: Once | INTRAVENOUS | Status: AC
  Administered 2016-10-23: 1500 mg via INTRAVENOUS
  Filled 2016-10-23: qty 1500

## 2016-10-23 MED ORDER — BUDESONIDE 0.25 MG/2ML IN SUSP: 0.2500 mg | Freq: Two times a day (BID) | RESPIRATORY_TRACT | Status: DC

## 2016-10-23 MED ORDER — SODIUM CHLORIDE 0.9 % IV BOLUS (SEPSIS)
1000.0000 mL | Freq: Once | INTRAVENOUS | Status: AC
  Administered 2016-10-23: 1000 mL via INTRAVENOUS

## 2016-10-23 MED ORDER — ARFORMOTEROL TARTRATE 15 MCG/2ML IN NEBU: 15.0000 ug | INHALATION_SOLUTION | Freq: Two times a day (BID) | RESPIRATORY_TRACT | Status: DC

## 2016-10-23 MED ORDER — PANTOPRAZOLE SODIUM 40 MG IV SOLR
40.0000 mg | Freq: Two times a day (BID) | INTRAVENOUS | Status: DC
  Administered 2016-10-24 – 2016-10-27 (×8): 40 mg via INTRAVENOUS
  Filled 2016-10-23 (×11): qty 40

## 2016-10-23 MED ORDER — PIPERACILLIN-TAZOBACTAM 3.375 G IVPB 30 MIN
3.3750 g | Freq: Once | INTRAVENOUS | Status: AC
  Administered 2016-10-23: 3.375 g via INTRAVENOUS
  Filled 2016-10-23: qty 50

## 2016-10-23 MED ORDER — EPINEPHRINE PF 1 MG/10ML IJ SOSY
PREFILLED_SYRINGE | INTRAMUSCULAR | Status: AC | PRN
  Administered 2016-10-23 (×2): 1 mg via INTRAVENOUS

## 2016-10-23 MED ORDER — FENTANYL CITRATE (PF) 100 MCG/2ML IJ SOLN: 50.0000 ug | Freq: Once | INTRAMUSCULAR | Status: DC

## 2016-10-23 MED ORDER — STERILE WATER FOR INJECTION IV SOLN
INTRAVENOUS | Status: DC
  Administered 2016-10-23: 23:00:00 via INTRAVENOUS
  Filled 2016-10-23 (×4): qty 850

## 2016-10-23 MED ORDER — SODIUM BICARBONATE 8.4 % IV SOLN
INTRAVENOUS | Status: AC | PRN
  Administered 2016-10-23 – 2016-10-24 (×2): 50 meq via INTRAVENOUS

## 2016-10-23 MED ORDER — VANCOMYCIN HCL IN DEXTROSE 750-5 MG/150ML-% IV SOLN: 750.0000 mg | INTRAVENOUS | Status: DC

## 2016-10-23 MED ORDER — SODIUM CHLORIDE 0.9 % IV SOLN
0.0000 ug/min | INTRAVENOUS | Status: DC
  Administered 2016-10-23: 20 ug/min via INTRAVENOUS
  Administered 2016-10-24 (×2): 400 ug/min via INTRAVENOUS
  Administered 2016-10-24: 200 ug/min via INTRAVENOUS
  Administered 2016-10-24: 400 ug/min via INTRAVENOUS
  Filled 2016-10-23 (×6): qty 1

## 2016-10-23 MED ORDER — PANTOPRAZOLE SODIUM 40 MG IV SOLR: 40.0000 mg | INTRAVENOUS | Status: DC

## 2016-10-23 MED ORDER — SODIUM CHLORIDE 0.9 % IV SOLN: INTRAVENOUS | Status: DC

## 2016-10-23 MED ORDER — MIDAZOLAM HCL 2 MG/2ML IJ SOLN
2.0000 mg | INTRAMUSCULAR | Status: DC | PRN
  Administered 2016-10-24 (×2): 2 mg via INTRAVENOUS
  Filled 2016-10-23 (×3): qty 2

## 2016-10-23 MED ORDER — BUDESONIDE 0.5 MG/2ML IN SUSP
0.5000 mg | Freq: Two times a day (BID) | RESPIRATORY_TRACT | Status: DC
  Administered 2016-10-24 – 2016-10-25 (×2): 0.5 mg via RESPIRATORY_TRACT
  Filled 2016-10-23 (×3): qty 2

## 2016-10-23 MED ORDER — HEPARIN BOLUS VIA INFUSION
4000.0000 [IU] | Freq: Once | INTRAVENOUS | Status: DC
  Filled 2016-10-23: qty 4000

## 2016-10-23 MED ORDER — DEXTROSE 5 % IV SOLN
0.0000 ug/min | Freq: Once | INTRAVENOUS | Status: AC
  Administered 2016-10-23: 20 ug/min via INTRAVENOUS
  Filled 2016-10-23 (×2): qty 4

## 2016-10-23 MED ORDER — SODIUM CHLORIDE 0.9 % IV SOLN
INTRAVENOUS | Status: AC | PRN
  Administered 2016-10-23: 1000 mL via INTRAVENOUS

## 2016-10-23 MED ORDER — FENTANYL BOLUS VIA INFUSION
50.0000 ug | INTRAVENOUS | Status: DC | PRN
  Administered 2016-10-23 – 2016-10-24 (×4): 50 ug via INTRAVENOUS
  Filled 2016-10-23: qty 50

## 2016-10-23 MED ORDER — FENTANYL CITRATE (PF) 100 MCG/2ML IJ SOLN: 100.0000 ug | INTRAMUSCULAR | Status: DC | PRN

## 2016-10-23 MED ORDER — HEPARIN (PORCINE) IN NACL 100-0.45 UNIT/ML-% IJ SOLN
1200.0000 [IU]/h | INTRAMUSCULAR | Status: DC
  Filled 2016-10-23: qty 250

## 2016-10-23 MED ORDER — IPRATROPIUM-ALBUTEROL 0.5-2.5 (3) MG/3ML IN SOLN
3.0000 mL | Freq: Four times a day (QID) | RESPIRATORY_TRACT | Status: DC
  Administered 2016-10-23 – 2016-10-27 (×17): 3 mL via RESPIRATORY_TRACT
  Filled 2016-10-23 (×17): qty 3

## 2016-10-23 MED ORDER — PIPERACILLIN-TAZOBACTAM 3.375 G IVPB
3.3750 g | Freq: Three times a day (TID) | INTRAVENOUS | Status: DC
  Administered 2016-10-24 (×2): 3.375 g via INTRAVENOUS
  Filled 2016-10-23 (×3): qty 50

## 2016-10-23 MED ORDER — FENTANYL 2500MCG IN NS 250ML (10MCG/ML) PREMIX INFUSION
25.0000 ug/h | INTRAVENOUS | Status: DC
Start: 2016-10-23 — End: 2016-10-27
  Administered 2016-10-23: 200 ug/h via INTRAVENOUS
  Administered 2016-10-24 – 2016-10-27 (×13): 400 ug/h via INTRAVENOUS
  Filled 2016-10-23 (×5): qty 250
  Filled 2016-10-23: qty 2500
  Filled 2016-10-23 (×10): qty 250

## 2016-10-23 MED ORDER — MIDAZOLAM HCL 2 MG/2ML IJ SOLN
2.0000 mg | INTRAMUSCULAR | Status: DC | PRN
  Administered 2016-10-23: 2 mg via INTRAVENOUS
  Filled 2016-10-23 (×2): qty 2

## 2016-10-23 MED ORDER — SODIUM CHLORIDE 0.9 % IV SOLN
250.0000 mL | INTRAVENOUS | Status: DC | PRN
Start: 2016-10-23 — End: 2016-10-24

## 2016-10-23 NOTE — Procedures (Signed)
Central Venous Catheter Insertion Procedure Note Mallory Medina [id number] [birth year]  Procedure: Insertion of Central Venous Catheter Indications: Assessment of intravascular volume, Drug and/or fluid administration and Frequent blood sampling  Procedure Details Consent: Unable to obtain consent because of emergent medical necessity. Time Out: Verified patient identification, verified procedure, site/side was marked, verified correct patient position, special equipment/implants available, medications/allergies/relevent history reviewed, required imaging and test results available.  Performed  Maximum sterile technique was used including antiseptics, cap, gloves, gown, hand hygiene, mask and sheet. Skin prep: Chlorhexidine; local anesthetic administered A antimicrobial bonded/coated triple lumen catheter was placed in the left internal jugular vein using the Seldinger technique.  Evaluation Blood flow good Complications: No apparent complications Patient did tolerate procedure well. Chest X-ray ordered to verify placement.  CXR: pending.  Mallory Medina, AGACNP-BC [facility]  Pgr: [phone]  PCCM Pgr: [phone]

## 2016-10-23 NOTE — Progress Notes (Addendum)
Pharmacy Antibiotic Note  Dwan BoltJatina W Hellickson is a 51 y.o. female admitted on 11/05/2016 with sepsis.  Pharmacy has been consulted for vancomycin dosing. Pt is afebrile and WBC is pending. Scr is elevated at 2.42 and lactic acid is significantly elevated at 16.58. She was recently started on azithromycin.   Plan: Vancomycin 1500mg  IV x 1 then 750mg  IV Q24H Zosyn 3.375gm IV Q8H (4 hr inf) F/u renal fxn, C&S, clinical status and trough at SS     Temp (24hrs), Avg:97.6 F (36.4 C), Min:97.6 F (36.4 C), Max:97.6 F (36.4 C)   Recent Labs Lab 10/20/16 1047  WBC 18.0*  CREATININE 0.96    Estimated Creatinine Clearance: 69.2 mL/min (by C-G formula based on SCr of 0.96 mg/dL).    Allergies  Allergen Reactions  . Sulfa Antibiotics Nausea And Vomiting    Stomach ache    Antimicrobials this admission: Vanc 8/7>> Zosyn 8/7>>  Dose adjustments this admission: N/A  Microbiology results: Pending  Thank you for allowing pharmacy to be a part of this patient's care.  Vyom Brass, Drake LeachRachel Lynn 11/04/2016 3:28 PM

## 2016-10-23 NOTE — ED Triage Notes (Signed)
Pt here from home with c/o being "sick" per daughter pt hypotensive with ems sats 78 % per fire , pt did have her methadone today

## 2016-10-23 NOTE — ED Provider Notes (Addendum)
[facility] DEPT Provider Note   CSN: [id number] Arrival date & time: 10/20/2016  1504     History   Chief Complaint Chief Complaint  Patient presents with  . Hypotension    HPI SHAKILA MAK is a 51 y.o. female.  The history is provided by the patient and the EMS personnel. No language interpreter was used.    ZEPPELIN BECKSTRAND is a 51 y.o. female who presents to the Emergency Department complaining of SOB.  Level V caveat due to respiratory distress.  She presents for increased shortness of breath and feeling sick for the last several days. She reports right-sided chest pain and abdominal pain. Symptoms are severe and constant.  Past Medical History:  Diagnosis Date  . Back pain, chronic   . COPD (chronic obstructive pulmonary disease) ([place])   . Diabetes mellitus without complication ([place])    Type II  . Hepatitis C   . Hypertension   . IV drug abuse recovery   hx of    Patient Active Problem List   Diagnosis Date Noted  . Anxiety state, unspecified 11/30/2012  . Unspecified constipation 11/30/2012  . Hyperglycemia 11/28/2012  . Elevated blood pressure 11/28/2012  . COPD with acute exacerbation ([place]) 11/28/2012  . CAP (community acquired pneumonia) 11/28/2012  . Thrombocytopenia, unspecified ([place]) 11/28/2012  . Cigarette nicotine dependence with withdrawal 11/28/2012  . Narcotic dependence ([place]) 11/28/2012  . Acute respiratory failure ([place]) 11/27/2012    Past Surgical History:  Procedure Laterality Date  . BACK SURGERY    . EXTERNAL FIXATION ARM Left   . KNEE ARTHROSCOPY Bilateral   . TONSILLECTOMY    . TUBAL LIGATION      OB History    No data available       Home Medications    Prior to Admission medications   Medication Sig Start Date End Date Taking? Authorizing Provider  albuterol (PROVENTIL HFA;VENTOLIN HFA) 108 (90 BASE) MCG/ACT inhaler Inhale 2 puffs into the lungs every 6 (six) hours as needed for wheezing. Patient not taking: Reported on  07/07/2016 12/01/12   Janece Canterbury, MD  albuterol (PROVENTIL) (2.5 MG/3ML) 0.083% nebulizer solution Take 3 mLs (2.5 mg total) by nebulization every 6 (six) hours as needed for wheezing or shortness of breath. Patient not taking: Reported on 09/15/2016 12/01/12   Janece Canterbury, MD  azithromycin (ZITHROMAX) 250 MG tablet Take 1 tablet (250 mg total) by mouth daily. Take 1 every day until finished. 10/21/16 10/25/16  Carmin Muskrat, MD  beclomethasone (QVAR) 40 MCG/ACT inhaler Inhale 2 puffs into the lungs 2 (two) times daily. Patient not taking: Reported on 03/17/2015 12/01/12   Janece Canterbury, MD  diphenhydramine-acetaminophen (TYLENOL PM) 25-500 MG TABS tablet Take 2 tablets by mouth at bedtime as needed (sleep).     [provider]  DULoxetine (CYMBALTA) 20 MG capsule Take 1 capsule (20 mg total) by mouth daily. Patient not taking: Reported on 03/17/2015 12/01/12   Janece Canterbury, MD  ipratropium (ATROVENT) 0.02 % nebulizer solution Take 2.5 mLs (500 mcg total) by nebulization 4 (four) times daily. Patient not taking: Reported on 10/20/2015 12/01/12   Janece Canterbury, MD  METHADONE HCL PO Take 120 mg by mouth daily. liquid    [provider]  methocarbamol (ROBAXIN) 500 MG tablet Take 1 tablet (500 mg total) by mouth every 8 (eight) hours as needed for muscle spasms. Patient not taking: Reported on 07/07/2016 10/24/15   Street, [place], PA-C  oxyCODONE-acetaminophen (PERCOCET) 5-325 MG tablet Take 1-2 tablets  by mouth every 4 (four) hours as needed. Patient not taking: Reported on 07/07/2016 10/20/15   Margarita Mail, PA-C  predniSONE (DELTASONE) 20 MG tablet Take 2 tablets (40 mg total) by mouth daily with breakfast. For the next four days 10/20/16   Carmin Muskrat, MD    Family History Family History  Problem Relation Age of Onset  . Diabetes Mellitus II Mother     Social History Social History  Substance Use Topics  . Smoking status: Current Every Day Smoker     Packs/day: 1.00    Types: Cigarettes  . Smokeless tobacco: Never Used  . Alcohol use No     Allergies   Sulfa antibiotics   Review of Systems Review of Systems  All other systems reviewed and are negative.    Physical Exam Updated Vital Signs BP (!) 64/42 (BP Location: Right Arm)   Physical Exam  Constitutional: She appears well-developed and well-nourished. She appears distressed.  HENT:  Head: Normocephalic and atraumatic.  Cardiovascular: Regular rhythm.   No murmur heard. Tachycardic  Pulmonary/Chest: Effort normal. No respiratory distress.  Good air movement bilaterally, tacyhpneic  Abdominal: Soft.  Moderate diffuse abdominal tenderness to palpation, greatest over the right hemiabdomen  Musculoskeletal: She exhibits no edema or tenderness.  Neurological:  Drowsy, speaks in short phrases, confused, profound generalized weakness  Skin: Skin is dry. Capillary refill takes more than 3 seconds.  Cool skin with mottled extremities and delayed cap refill  Psychiatric: She has a normal mood and affect. Her behavior is normal.  Nursing note and vitals reviewed.    ED Treatments / Results  Labs (all labs ordered are listed, but only abnormal results are displayed) Labs Reviewed  COMPREHENSIVE METABOLIC PANEL  CBC WITH DIFFERENTIAL/PLATELET  PROTIME-INR  BLOOD GAS, ARTERIAL  I-STAT VENOUS BLOOD GAS, ED  I-STAT TROPONIN, ED  I-STAT CHEM 8, ED  I-STAT CG4 LACTIC ACID, ED    EKG  EKG Interpretation None       Radiology No results found.  Procedures Procedures (including critical care time) CRITICAL CARE Performed by: Quintella Reichert   Total critical care time: 65 minutes  Critical care time was exclusive of separately billable procedures and treating other patients.  Critical care was necessary to treat or prevent imminent or life-threatening deterioration.  Critical care was time spent personally by me on the following activities: development of  treatment plan with patient and/or surrogate as well as nursing, discussions with consultants, evaluation of patient's response to treatment, examination of patient, obtaining history from patient or surrogate, ordering and performing treatments and interventions, ordering and review of laboratory studies, ordering and review of radiographic studies, pulse oximetry and re-evaluation of patient's condition.  INTUBATION Performed by: Quintella Reichert  Required items: required blood products, implants, devices, and special equipment available Patient identity confirmed: provided demographic data and hospital-assigned identification number  Indications: cardiopulmonary arrest  Intubation method: DL, Mac 3  Preoxygenation: BVM  Sedatives: none Paralytic: none  Tube Size: 7.5 cuffed  Post-procedure assessment: chest rise and ETCO2 monitor Breath sounds: equal and absent over the epigastrium Tube secured with: ETT holder Chest x-ray interpreted by radiologist and me.  Chest x-ray findings:endotracheal tube in right mainstem, retracted.    Patient tolerated the procedure well with no immediate complications.   Femoral stick was performed in the right groin for blood draw as well as ABG. The area was cleansed with alcohol.  Medications Ordered in ED Medications  sodium chloride 0.9 % bolus 1,000 mL (not administered)  sodium chloride 0.9 % bolus 1,000 mL (not administered)  piperacillin-tazobactam (ZOSYN) IVPB 3.375 g (not administered)     Initial Impression / Assessment and Plan / ED Course  I have reviewed the triage vital signs and the nursing notes.  Pertinent labs & imaging results that were available during my care of the patient were reviewed by me and considered in my medical decision making (see chart for details).     Patient here for evaluation of shortness of breath, right-sided chest and abdominal pain. She was extremely ill-appearing on ED presentation with evidence  of shock and poor perfusion. She was treated with IV fluid resuscitation with improvement in her clinical exam. Her cap refill improved and mottling resolved. Her mental status improved with improvement in her blood pressure and she was mentating well. Concern for possible pneumonia or sepsis and she was started on IV antibiotics.Critical care consulted for admission.  While patient was pending a admission she became bradycardicand unresponsive. CPR was initiated. Please see code sheet for further details of resuscitation. During resuscitation she did have some coffee grounds emesis from her mouth and nose. After ROSC intubation was performed. Patient transferred to the ICU for further treatment and stabilization.  Final Clinical Impressions(s) / ED Diagnoses   Final diagnoses:  None    New Prescriptions New Prescriptions   No medications on file     Quintella Reichert, MD 10/25/16 0139    Quintella Reichert, MD 10/25/16 [phone]

## 2016-10-23 NOTE — Progress Notes (Signed)
eLink Physician-Brief Progress Note Patient Name: Mallory BoltJatina W Medina DOB: [birth year] MRN: [id number]   Date of Service  10/26/2016  HPI/Events of Note  Septic shock s/p PEA arrest, now with persistent hypotension, levophed maxed.   eICU Interventions  Will start phenylephrine.      Intervention Category Major Interventions: Hypotension - evaluation and management  Shane Crutchradeep Rosco Harriott 11/04/2016, 10:56 PM

## 2016-10-23 NOTE — ED Notes (Signed)
Pt provided ice chips.

## 2016-10-23 NOTE — Procedures (Signed)
Arterial Catheter Insertion Procedure Note Mallory BoltJatina W Medina [id number] 1965-05-09  Procedure: Insertion of Arterial Catheter  Indications: Blood pressure monitoring  Procedure Details Consent: Unable to obtain consent because of patient intubated and on ventilator.. Time Out: Verified patient identification, verified procedure, site/side was marked, verified correct patient position, special equipment/implants available, medications/allergies/relevent history reviewed, required imaging and test results available.  Performed  Maximum sterile technique was used including antiseptics, cap, gloves, gown, hand hygiene, mask and sheet. Skin prep: Chlorhexidine; local anesthetic administered 20 gauge catheter was inserted into right radial artery using the Seldinger technique.  Evaluation Blood flow good; BP tracing good. Complications: No apparent complications.   Hiram ComberJoseph R Keddrick Wyne 11/16/2016

## 2016-10-23 NOTE — Code Documentation (Signed)
Family at beside. Family given emotional support by chaplin

## 2016-10-23 NOTE — ED Notes (Signed)
Lactic acid result given to Dr. Rees 

## 2016-10-23 NOTE — ED Notes (Signed)
Patient transported to CT 

## 2016-10-23 NOTE — Progress Notes (Signed)
Patient noted to be unresponsive without pulse. PEA arrest for 8 minutes. Patient intubated, a-line and CVC placed. Family updated at bedside. Patient with large amount of coffee ground emesis. OG tube insertion and placed on suction. CT Head pending. Cardiology fellow paged for STAT bedside ECHO.    Jovita KussmaulKatalina Osualdo Hansell, AGACNP-BC [facility]  Pgr: [phone]  PCCM Pgr: [phone]

## 2016-10-23 NOTE — ED Notes (Signed)
EMESIS X1 DURING CPR.

## 2016-10-23 NOTE — H&P (Signed)
Name: Dwan BoltJatina W Solt MRN: [id number] DOB: 1965/08/25    ADMISSION DATE:  11/04/2016 CONSULTATION DATE:  11/07/2016  REFERRING MD :  Dr. Madilyn Hookees   CHIEF COMPLAINT:  Hypotension/Sepsis   HISTORY OF PRESENT ILLNESS:   51 year old female current smoker with PMH of Chronic Pain Back, COPD, DM, Hepatitis C, HTN, IV drug abuse, non-compliant with medications due to financial restraints   Presented to ED on 8/7 with lethargy and hypotension. Upon EMS arrival oxygen saturation 78% on room air. Upon arrival to ED BP 97/86, WBC 18.7, LA 16.58, ABG 7.1/30.1/241. Given 3L NS. Mental Status began to improve. Patient stated that she has a chronic productive cough with black to yellow sputum, chronic dyspnea, several days of burning will urinating, and generalized pain/fatiuge. Has been bed bound for the last 3 days, reports N/V/D during this time as well. PCCM asked to admit.   Presented to ED on 8/4 with upper back pain with dyspnea and a productive cough for 3 says. CXR not revealing of PNA, discharged with bronchodilators, steroids, and azithromycin   SIGNIFICANT EVENTS  8/7 > Presents to ED   STUDIES:  CXR 8/7 > No acute cardiopulmonary process  CT Chest/AP 8/7 > Suboptimal assessment of soft tissues without intravenous contrast. Trace right pleural effusion.  No consolidation. Mildly patulous and fluid-filled esophagus. Splenomegaly, volume of 430 cc. Mild nonspecific distention of the transverse colon without appreciable inflammatory change. Mild thoracic and moderate abdominal aortic calcific atherosclerosis. Stable severe T11 compression deformity with 6 mm retropulsion superior endplate mild T3 compression deformity. Multiple chronic rib fracture deformities. Edema in subcutaneous fat of right lateral and posterior thoracoabdominal walls, probably third-spacing with asymmetry due to patient positioning.  MICROBIOLOGY: Blood 8/7 >> Urine 8/7 >> Sputum 8/7 >>   ANTIBIOTICS: Vancomycin 8/7 >> Zosyn  8/7 >>   PAST MEDICAL HISTORY :   has a past medical history of Back pain, chronic; COPD (chronic obstructive pulmonary disease) (HCC); Diabetes mellitus without complication (HCC); Hepatitis C; Hypertension; and IV drug abuse (recovery).  has a past surgical history that includes Back surgery; External fixation arm (Left); Knee arthroscopy (Bilateral); Tonsillectomy; and Tubal ligation. Prior to Admission medications   Medication Sig Start Date End Date Taking? Authorizing Provider  azithromycin (ZITHROMAX) 250 MG tablet Take 1 tablet (250 mg total) by mouth daily. Take 1 every day until finished. 10/21/16 10/25/16 Yes Gerhard MunchLockwood, Robert, MD  diphenhydramine-acetaminophen (TYLENOL PM) 25-500 MG TABS tablet Take 2 tablets by mouth at bedtime as needed (sleep).    Yes [provider]  METHADONE HCL PO Take 120 mg by mouth daily. liquid   Yes [provider]  predniSONE (DELTASONE) 20 MG tablet Take 2 tablets (40 mg total) by mouth daily with breakfast. For the next four days 10/20/16  Yes Gerhard MunchLockwood, Robert, MD  albuterol (PROVENTIL HFA;VENTOLIN HFA) 108 (90 BASE) MCG/ACT inhaler Inhale 2 puffs into the lungs every 6 (six) hours as needed for wheezing. Patient not taking: Reported on 07/07/2016 12/01/12   Renae FickleShort, Mackenzie, MD  albuterol (PROVENTIL) (2.5 MG/3ML) 0.083% nebulizer solution Take 3 mLs (2.5 mg total) by nebulization every 6 (six) hours as needed for wheezing or shortness of breath. Patient not taking: Reported on 09/15/2016 12/01/12   Renae FickleShort, Mackenzie, MD  beclomethasone (QVAR) 40 MCG/ACT inhaler Inhale 2 puffs into the lungs 2 (two) times daily. Patient not taking: Reported on 03/17/2015 12/01/12   Renae FickleShort, Mackenzie, MD  DULoxetine (CYMBALTA) 20 MG capsule Take 1 capsule (20 mg total) by  mouth daily. Patient not taking: Reported on 03/17/2015 12/01/12   Renae Fickle, MD  ipratropium (ATROVENT) 0.02 % nebulizer solution Take 2.5 mLs (500 mcg total) by nebulization 4 (four) times  daily. Patient not taking: Reported on 10/20/2015 12/01/12   Renae Fickle, MD  methocarbamol (ROBAXIN) 500 MG tablet Take 1 tablet (500 mg total) by mouth every 8 (eight) hours as needed for muscle spasms. Patient not taking: Reported on 07/07/2016 10/24/15   Street, [place], PA-C  oxyCODONE-acetaminophen (PERCOCET) 5-325 MG tablet Take 1-2 tablets by mouth every 4 (four) hours as needed. Patient not taking: Reported on 07/07/2016 10/20/15   Arthor Captain, PA-C   Allergies  Allergen Reactions  . Sulfa Antibiotics Nausea And Vomiting    Stomach ache    FAMILY HISTORY:  family history includes Diabetes Mellitus II in her mother. SOCIAL HISTORY:  reports that she has been smoking Cigarettes.  She has been smoking about 1.00 pack per day. She has never used smokeless tobacco. She reports that she uses drugs, including Marijuana. She reports that she does not drink alcohol.  REVIEW OF SYSTEMS:   All negative; except for those that are bolded, which indicate positives.  Constitutional: weight loss, weight gain, night sweats, fevers, chills, fatigue, weakness.  HEENT: headaches, sore throat, sneezing, nasal congestion, post nasal drip, difficulty swallowing, tooth/dental problems, visual complaints, visual changes, ear aches. Neuro: difficulty with speech, weakness, numbness, ataxia. CV:  chest pain, orthopnea, PND, swelling in lower extremities, dizziness, palpitations, syncope.  Resp: cough, hemoptysis, dyspnea, wheezing. GI: heartburn, indigestion, abdominal pain, nausea, vomiting, diarrhea, constipation, change in bowel habits, loss of appetite, hematemesis, melena, hematochezia.  GU: dysuria, change in color of urine, urgency or frequency, flank pain, hematuria. MSK: joint pain or swelling, decreased range of motion. Psych: change in mood or affect, depression, anxiety, suicidal ideations, homicidal ideations. Skin: rash, itching, bruising.  SUBJECTIVE:   VITAL SIGNS: Temp:  [96.2 F  (35.7 C)-97.6 F (36.4 C)] 97.5 F (36.4 C) (08/07 1850) Pulse Rate:  [120-131] 123 (08/07 1850) Resp:  [20-38] 38 (08/07 1545) BP: (64-116)/(41-86) 112/81 (08/07 1850) SpO2:  [90 %-97 %] 92 % (08/07 1850)  PHYSICAL EXAMINATION: General:  Chronically ill appearing adult female  Neuro:  Alert, follows commands, oriented to person/place/time, pupils intact  HEENT:  Dry MM  Cardiovascular:  Tachy, no MRG Lungs:  Clear breath sounds, no wheeze  Abdomen:  Tender, active bowel sounds, non-distended, bruising noted around umbilicus   Musculoskeletal:  -edema Skin:  Warm, dry, intact    Recent Labs Lab 10/20/16 1047 10/31/2016 1513 10/28/2016 1528  NA 128* 128* 128*  K 4.3 4.8 4.3  CL 88* 84* 91*  CO2 29 15*  --   BUN 10 36* 45*  CREATININE 0.96 2.42* 2.00*  GLUCOSE 231* 124* 117*    Recent Labs Lab 10/20/16 1047 10/18/2016 1513 11/01/2016 1528  HGB 13.8 18.8* 21.1*  HCT 41.3 53.8* 62.0*  WBC 18.0* 18.7*  --   PLT 105* 298  --    Ct Abdomen Pelvis Wo Contrast  Result Date: 11/09/2016 CLINICAL DATA:  51 y/o  F; lactic acidosis and admitted for sepsis. EXAM: CT CHEST, ABDOMEN AND PELVIS WITHOUT CONTRAST TECHNIQUE: Multidetector CT imaging of the chest, abdomen and pelvis was performed following the standard protocol without IV contrast. COMPARISON:  11/27/2012 CT of the chest. 10/20/2015 chest radiograph. FINDINGS: CT CHEST FINDINGS Cardiovascular: No significant vascular findings. Normal heart size. No pericardial effusion. Mild calcific atherosclerosis of the thoracic aorta. Mediastinum/Nodes: Mildly patulous  and fluid-filled esophagus. No adenopathy. Normal thyroid gland. Lungs/Pleura: No consolidation or pneumothorax. Patent central airways. Trace right pleural effusion. Musculoskeletal: Numerous bilateral posterior rib fractures, several healed with callus formation, several with productive changes and nonunion. No acute bone fracture identified. Edema within subcutaneous fat of the  chest wall asymmetrically on the right and posteriorly. CT ABDOMEN PELVIS FINDINGS Hepatobiliary: No focal liver abnormality is seen. No gallstones, gallbladder wall thickening, or biliary dilatation. Pancreas: Unremarkable. No pancreatic ductal dilatation or surrounding inflammatory changes. Spleen: The spleen measures 9.5 x 5.5 x 15.7 cm (volume = 430 cm^3)(AP x ML x CC series 3, image 51 and series 7, image 71) . Adrenals/Urinary Tract: Adrenal glands are unremarkable. Kidneys are normal, without renal calculi, focal lesion, or hydronephrosis. Bladder is unremarkable. Stomach/Bowel: Stomach is within normal limits. Appendix not identified. No bowel wall thickening or inflammatory changes. Mild nonspecific distention of the transverse colon. Vascular/Lymphatic: Aortic atherosclerosis with moderate calcific atherosclerosis. No enlarged abdominal or pelvic lymph nodes. Reproductive: Uterus and bilateral adnexa are unremarkable. Other: No abdominal wall hernia or abnormality. No abdominopelvic ascites. Musculoskeletal: L4-S1 posterior instrumented fusion, lateral mass fusion, and laminectomy. No periprosthetic fracture or lucency identified. Stable severe compression deformity of T11 vertebral body with 6 mm retropulsion of superior endplate. Stable chronic superior endplate fracture of T3 vertebral body with mild loss of height. Edema within the subcutaneous fat of the abdominal wall asymmetrically to the right and posteriorly. IMPRESSION: 1. Suboptimal assessment of soft tissues without intravenous contrast. 2. Trace right pleural effusion.  No consolidation. 3. Mildly patulous and fluid-filled esophagus. 4. Splenomegaly, volume of 430 cc. 5. Mild nonspecific distention of the transverse colon without appreciable inflammatory change. 6. Mild thoracic and moderate abdominal aortic calcific atherosclerosis. 7. Stable severe T11 compression deformity with 6 mm retropulsion superior endplate mild T3 compression  deformity. 8. Multiple chronic rib fracture deformities. 9. Edema in subcutaneous fat of right lateral and posterior thoracoabdominal walls, probably third-spacing with asymmetry due to patient positioning. Electronically Signed   By: Mitzi Hansen M.D.   On: 11/03/2016 17:32   Ct Chest Wo Contrast  Result Date: 11/13/2016 CLINICAL DATA:  51 y/o  F; lactic acidosis and admitted for sepsis. EXAM: CT CHEST, ABDOMEN AND PELVIS WITHOUT CONTRAST TECHNIQUE: Multidetector CT imaging of the chest, abdomen and pelvis was performed following the standard protocol without IV contrast. COMPARISON:  11/27/2012 CT of the chest. 10/20/2015 chest radiograph. FINDINGS: CT CHEST FINDINGS Cardiovascular: No significant vascular findings. Normal heart size. No pericardial effusion. Mild calcific atherosclerosis of the thoracic aorta. Mediastinum/Nodes: Mildly patulous and fluid-filled esophagus. No adenopathy. Normal thyroid gland. Lungs/Pleura: No consolidation or pneumothorax. Patent central airways. Trace right pleural effusion. Musculoskeletal: Numerous bilateral posterior rib fractures, several healed with callus formation, several with productive changes and nonunion. No acute bone fracture identified. Edema within subcutaneous fat of the chest wall asymmetrically on the right and posteriorly. CT ABDOMEN PELVIS FINDINGS Hepatobiliary: No focal liver abnormality is seen. No gallstones, gallbladder wall thickening, or biliary dilatation. Pancreas: Unremarkable. No pancreatic ductal dilatation or surrounding inflammatory changes. Spleen: The spleen measures 9.5 x 5.5 x 15.7 cm (volume = 430 cm^3)(AP x ML x CC series 3, image 51 and series 7, image 71) . Adrenals/Urinary Tract: Adrenal glands are unremarkable. Kidneys are normal, without renal calculi, focal lesion, or hydronephrosis. Bladder is unremarkable. Stomach/Bowel: Stomach is within normal limits. Appendix not identified. No bowel wall thickening or  inflammatory changes. Mild nonspecific distention of the transverse colon. Vascular/Lymphatic: Aortic atherosclerosis with  moderate calcific atherosclerosis. No enlarged abdominal or pelvic lymph nodes. Reproductive: Uterus and bilateral adnexa are unremarkable. Other: No abdominal wall hernia or abnormality. No abdominopelvic ascites. Musculoskeletal: L4-S1 posterior instrumented fusion, lateral mass fusion, and laminectomy. No periprosthetic fracture or lucency identified. Stable severe compression deformity of T11 vertebral body with 6 mm retropulsion of superior endplate. Stable chronic superior endplate fracture of T3 vertebral body with mild loss of height. Edema within the subcutaneous fat of the abdominal wall asymmetrically to the right and posteriorly. IMPRESSION: 1. Suboptimal assessment of soft tissues without intravenous contrast. 2. Trace right pleural effusion.  No consolidation. 3. Mildly patulous and fluid-filled esophagus. 4. Splenomegaly, volume of 430 cc. 5. Mild nonspecific distention of the transverse colon without appreciable inflammatory change. 6. Mild thoracic and moderate abdominal aortic calcific atherosclerosis. 7. Stable severe T11 compression deformity with 6 mm retropulsion superior endplate mild T3 compression deformity. 8. Multiple chronic rib fracture deformities. 9. Edema in subcutaneous fat of right lateral and posterior thoracoabdominal walls, probably third-spacing with asymmetry due to patient positioning. Electronically Signed   By: Mitzi Hansen M.D.   On: Oct 31, 2016 17:32   Dg Chest Port 1 View  Result Date: 10/31/16 CLINICAL DATA:  Patient with shortness of breath. EXAM: PORTABLE CHEST 1 VIEW COMPARISON:  Chest radiograph 10/20/2016 FINDINGS: Monitoring leads overlie the patient. Patient is rotated to the left. Stable enlarged cardiac and mediastinal contours. No consolidative pulmonary opacities. No pleural effusion or pneumothorax. IMPRESSION:  Cardiomegaly.  No acute cardiopulmonary process. Electronically Signed   By: Annia Belt M.D.   On: 2016/10/31 15:46    ASSESSMENT / PLAN:  Acute Hypoxic Respiratory Failure  ?Pulmonary Embolism  -Wells Score 6.0  H/O COPD  Plan  -Maintain Oxygen >92 -Pulmonary Hygiene  -Duoneb/Pulmicort/Brovana  -Trend ABG  -ECHO/Lower Extremity US pending   Hypotension in setting of septic shock  Plan  -Cardiac Monitoring  -Maintain MAP >65  -Cortisol Pending   Septic Shock with unknown etiology   -CT Chest and AP > unremarkable  -Afebrile, WBC 18.7 Plan  -Trend WBC and Fever Curve -Follow culture data -Vancomycin and Zosyn  -Trend PCT  -C.Diff panel pending   Acute Renal Failure   Anion Gap Metabolic Acidosis  Lactic Acidosis  -LA 16.58 >>  Plan  -Trend BMP -NS @ 125 ml/hr (s/p 3L NS in ED)  -Replace electrolytes as indicated  -Trend Lactic Acid   Elevated LFT  H/O Hepatitis C  Plan -NPO -PPI  -Trend LFT   Acute Metabolic Encephalopathy  Chronic Pain  H/O OD/SI  Plan  -Monitor  -Hold Sedative medications  -UDS pending  -Ammonia Pending   CC Time: 45 minutes   Jovita Kussmaul, AGACNP-BC [facility]  Pgr: [phone]  PCCM Pgr: [phone]  Attending Addendum: I personally examined this patient and agree with plan as detailed above. Briefly, this is a 51yoF with COPD, DM, HCV, Chronic pain (receiving Methadone), and remote IVDU, who presents to the ER today c/o lethargy, SOB (chronic but worse from baseline), Cough productive of yellow sputum (chronic), Right-chest pain above her breast, Back pain (lumbar back pain is chronic now with new mid-thoracic back pain as well), N/V (approx 3x nonbloody in past couple days), Diarrhea (watery nonbloody for past 3 days), and diffuse abdominal pain. In the ER she was found to be in septic shock with lactate of 16.5 and shock that improved with IVF's though SBP remained in 80's. Plan was to admit her to  the ICU for  sepsis workup and treatment. Concern also existed that this SOB and Back pain could possibly be due to PE, especially given clear CXR. However, she also had AKI and therefore CTA Chest could not be performed. Decision was for TTE and empiric anticoagulation. Before that could be performed though, she had PEA cardiac arrest in the ER and received 8 minutes of CPR. Following ROSC, patient intubated and approx 1Liter of coffee ground emesis returned. Therefore, patient was not empirically treated with TPA, and heparin gtt was not started. Patient transferred to ICU where she is not in severe septic shock on 2 vasopressors and a bicarb gtt. New ABG now shows respiratory acidosis (for which RR increased from 14 to 30) and worsening metabolic acidosis despite the bicarb gtt. Repeat lactate pending. The ABG also shows significant worsening of her oxygenation despite CXR still showing no infiltrates. I am very concerned about possible PE. STAT TTE ordered for tonight. Repeat Hgb, Plt, Coags, and DIC labs pending. If not in DIC and Hgb stable, will reconsider empiric anticoaguation and therapeutic hypothermia. If this is NOT a PE causing this shock and lactic acidosis, then we do not have a clear source of infection. UA was not impressive. CXR clear. Despite her abdominal pain, CT Abdomen was unrevealing. Could possibly have a bacteremia or Cdiff. Pancultures pending. Will empirically start PO Vanc in addition to the IV Vanc and IV Zosyn.   90 minutes critical care time  Milana Obey, MD Pulmonary & Critical Care

## 2016-10-23 NOTE — Progress Notes (Signed)
While with family experiencing a pt death across hall, heard family member in this pt's rm unconrtrollably sobbing and crying out. Provided ministry of touch/presence and emotional/social/psychological support to daughter having panic attack to see pt/mom coding. Accompanied daughter outside to await other family members while med staff did sterile procedures after resus attempts worked. Chaplain to f/u.   04-13-16 2100  Clinical Encounter Type  Visited With Family;Health care provider  Visit Type Initial;Follow-up;Psychological support;Spiritual support;Social support;ED  Referral From Chaplain  Spiritual Encounters  Spiritual Needs Emotional;Grief support  Stress Factors  Patient Stress Factors Health changes  Family Stress Factors Family relationships;Health changes;Loss of control   Ephraim Hamburgerynthia A Carnelius Hammitt, [address] Hospital [address]haplain

## 2016-10-23 NOTE — Progress Notes (Signed)
Transported Pt to 2H room 02  form ED Trama B without any complications.

## 2016-10-23 NOTE — ED Notes (Signed)
Admitting NP noted patient was diaphoretic and unresponsive. No pulses noted pt in PEA. CPR initiated and Pt bagged until pulses returned. Pt intubated.

## 2016-10-23 NOTE — Progress Notes (Signed)
Pt saturations dropped to low 60's.  Post x-ray pulled ETT tube back 2cm from 24  to 22. Saturation immediately increased to 100%.

## 2016-10-24 ENCOUNTER — Inpatient Hospital Stay (HOSPITAL_COMMUNITY): Payer: Medicaid Other

## 2016-10-24 DIAGNOSIS — K920 Hematemesis: Secondary | ICD-10-CM

## 2016-10-24 DIAGNOSIS — R6521 Severe sepsis with septic shock: Secondary | ICD-10-CM

## 2016-10-24 DIAGNOSIS — A4101 Sepsis due to Methicillin susceptible Staphylococcus aureus: Secondary | ICD-10-CM

## 2016-10-24 DIAGNOSIS — A419 Sepsis, unspecified organism: Secondary | ICD-10-CM

## 2016-10-24 DIAGNOSIS — F1990 Other psychoactive substance use, unspecified, uncomplicated: Secondary | ICD-10-CM

## 2016-10-24 DIAGNOSIS — Z9911 Dependence on respirator [ventilator] status: Secondary | ICD-10-CM

## 2016-10-24 DIAGNOSIS — I2699 Other pulmonary embolism without acute cor pulmonale: Secondary | ICD-10-CM

## 2016-10-24 DIAGNOSIS — R002 Palpitations: Secondary | ICD-10-CM

## 2016-10-24 DIAGNOSIS — I469 Cardiac arrest, cause unspecified: Secondary | ICD-10-CM

## 2016-10-24 LAB — BLOOD GAS, ARTERIAL
ACID-BASE DEFICIT: 10.4 mmol/L — AB (ref 0.0–2.0)
Acid-base deficit: 8 mmol/L — ABNORMAL HIGH (ref 0.0–2.0)
BICARBONATE: 18.1 mmol/L — AB (ref 20.0–28.0)
Bicarbonate: 15.9 mmol/L — ABNORMAL LOW (ref 20.0–28.0)
Drawn by: 441371
Drawn by: 51133
FIO2: 80
FIO2: 60
LHR: 30 {breaths}/min
LHR: 30 {breaths}/min
O2 SAT: 95.5 %
O2 Saturation: 89.8 %
PATIENT TEMPERATURE: 97.3
PCO2 ART: 39.2 mmHg (ref 32.0–48.0)
PEEP: 10 cmH2O
PEEP: 14 cmH2O
PH ART: 7.227 — AB (ref 7.350–7.450)
PO2 ART: 66.7 mmHg — AB (ref 83.0–108.0)
Patient temperature: 98.6
VT: 500 mL
VT: 420 mL
pCO2 arterial: 44.2 mmHg (ref 32.0–48.0)
pH, Arterial: 7.236 — ABNORMAL LOW (ref 7.350–7.450)
pO2, Arterial: 85.9 mmHg (ref 83.0–108.0)

## 2016-10-24 LAB — ECHOCARDIOGRAM COMPLETE
Height: 63 in
Weight: 2800.72 oz

## 2016-10-24 LAB — BLOOD CULTURE ID PANEL (REFLEXED)
Acinetobacter baumannii: NOT DETECTED
CANDIDA ALBICANS: NOT DETECTED
CANDIDA GLABRATA: NOT DETECTED
CANDIDA PARAPSILOSIS: NOT DETECTED
CANDIDA TROPICALIS: NOT DETECTED
Candida krusei: NOT DETECTED
Enterobacter cloacae complex: NOT DETECTED
Enterobacteriaceae species: NOT DETECTED
Enterococcus species: NOT DETECTED
Escherichia coli: NOT DETECTED
HAEMOPHILUS INFLUENZAE: NOT DETECTED
KLEBSIELLA OXYTOCA: NOT DETECTED
KLEBSIELLA PNEUMONIAE: NOT DETECTED
Listeria monocytogenes: NOT DETECTED
METHICILLIN RESISTANCE: NOT DETECTED
Neisseria meningitidis: NOT DETECTED
PROTEUS SPECIES: NOT DETECTED
Pseudomonas aeruginosa: NOT DETECTED
STAPHYLOCOCCUS SPECIES: DETECTED — AB
STREPTOCOCCUS PYOGENES: NOT DETECTED
Serratia marcescens: NOT DETECTED
Staphylococcus aureus (BCID): DETECTED — AB
Streptococcus agalactiae: NOT DETECTED
Streptococcus pneumoniae: NOT DETECTED
Streptococcus species: NOT DETECTED

## 2016-10-24 LAB — FIBRINOGEN: Fibrinogen: 539 mg/dL — ABNORMAL HIGH (ref 210–475)

## 2016-10-24 LAB — GLUCOSE, CAPILLARY
GLUCOSE-CAPILLARY: 136 mg/dL — AB (ref 65–99)
GLUCOSE-CAPILLARY: 27 mg/dL — AB (ref 65–99)
GLUCOSE-CAPILLARY: 30 mg/dL — AB (ref 65–99)
GLUCOSE-CAPILLARY: 34 mg/dL — AB (ref 65–99)
GLUCOSE-CAPILLARY: 98 mg/dL (ref 65–99)
Glucose-Capillary: 125 mg/dL — ABNORMAL HIGH (ref 65–99)
Glucose-Capillary: 127 mg/dL — ABNORMAL HIGH (ref 65–99)
Glucose-Capillary: 135 mg/dL — ABNORMAL HIGH (ref 65–99)
Glucose-Capillary: 142 mg/dL — ABNORMAL HIGH (ref 65–99)
Glucose-Capillary: 144 mg/dL — ABNORMAL HIGH (ref 65–99)

## 2016-10-24 LAB — LACTIC ACID, PLASMA
LACTIC ACID, VENOUS: 10.4 mmol/L — AB (ref 0.5–1.9)
LACTIC ACID, VENOUS: 13.5 mmol/L — AB (ref 0.5–1.9)
Lactic Acid, Venous: 9.8 mmol/L (ref 0.5–1.9)
Lactic Acid, Venous: 9.3 mmol/L (ref 0.5–1.9)

## 2016-10-24 LAB — CBC
HEMATOCRIT: 44.6 % (ref 36.0–46.0)
HEMOGLOBIN: 14.9 g/dL (ref 12.0–15.0)
MCH: 30.2 pg (ref 26.0–34.0)
MCHC: 33.4 g/dL (ref 30.0–36.0)
MCV: 90.5 fL (ref 78.0–100.0)
Platelets: 198 10*3/uL (ref 150–400)
RBC: 4.93 MIL/uL (ref 3.87–5.11)
RDW: 14.3 % (ref 11.5–15.5)
WBC: 15.6 10*3/uL — ABNORMAL HIGH (ref 4.0–10.5)

## 2016-10-24 LAB — BASIC METABOLIC PANEL
ANION GAP: 17 — AB (ref 5–15)
Anion gap: 22 — ABNORMAL HIGH (ref 5–15)
BUN: 38 mg/dL — AB (ref 6–20)
BUN: 39 mg/dL — ABNORMAL HIGH (ref 6–20)
CHLORIDE: 95 mmol/L — AB (ref 101–111)
CHLORIDE: 97 mmol/L — AB (ref 101–111)
CO2: 15 mmol/L — AB (ref 22–32)
CO2: 16 mmol/L — ABNORMAL LOW (ref 22–32)
CREATININE: 2.36 mg/dL — AB (ref 0.44–1.00)
Calcium: 7.2 mg/dL — ABNORMAL LOW (ref 8.9–10.3)
Calcium: 7.3 mg/dL — ABNORMAL LOW (ref 8.9–10.3)
Creatinine, Ser: 2.33 mg/dL — ABNORMAL HIGH (ref 0.44–1.00)
GFR calc Af Amer: 27 mL/min — ABNORMAL LOW (ref >60)
GFR calc non Af Amer: 23 mL/min — ABNORMAL LOW (ref >60)
GFR, EST AFRICAN AMERICAN: 26 mL/min — AB (ref >60)
GFR, EST NON AFRICAN AMERICAN: 23 mL/min — AB (ref >60)
GLUCOSE: 152 mg/dL — AB (ref 65–99)
Glucose, Bld: 48 mg/dL — ABNORMAL LOW (ref 65–99)
POTASSIUM: 4 mmol/L (ref 3.5–5.1)
Potassium: 3.7 mmol/L (ref 3.5–5.1)
Sodium: 130 mmol/L — ABNORMAL LOW (ref 135–145)
Sodium: 132 mmol/L — ABNORMAL LOW (ref 135–145)

## 2016-10-24 LAB — LIPASE, BLOOD
LIPASE: 27 U/L (ref 11–51)
Lipase: 30 U/L (ref 11–51)

## 2016-10-24 LAB — CBC WITH DIFFERENTIAL/PLATELET
BASOS PCT: 1 %
BASOS PCT: 2 %
Basophils Absolute: 0.1 10*3/uL (ref 0.0–0.1)
Basophils Absolute: 0.6 10*3/uL — ABNORMAL HIGH (ref 0.0–0.1)
EOS ABS: 0 10*3/uL (ref 0.0–0.7)
EOS PCT: 0 %
Eosinophils Absolute: 0 10*3/uL (ref 0.0–0.7)
Eosinophils Relative: 0 %
HCT: 39.8 % (ref 36.0–46.0)
HCT: 47.2 % — ABNORMAL HIGH (ref 36.0–46.0)
HEMOGLOBIN: 15.7 g/dL — AB (ref 12.0–15.0)
Hemoglobin: 13.7 g/dL (ref 12.0–15.0)
LYMPHS ABS: 2.9 10*3/uL (ref 0.7–4.0)
LYMPHS PCT: 22 %
Lymphocytes Relative: 9 %
Lymphs Abs: 3.3 10*3/uL (ref 0.7–4.0)
MCH: 30 pg (ref 26.0–34.0)
MCH: 30.5 pg (ref 26.0–34.0)
MCHC: 33.3 g/dL (ref 30.0–36.0)
MCHC: 34.4 g/dL (ref 30.0–36.0)
MCV: 88.6 fL (ref 78.0–100.0)
MCV: 90.1 fL (ref 78.0–100.0)
MONOS PCT: 3 %
Monocytes Absolute: 0.9 10*3/uL (ref 0.1–1.0)
Monocytes Absolute: 1 10*3/uL (ref 0.1–1.0)
Monocytes Relative: 6 %
NEUTROS PCT: 71 %
NEUTROS PCT: 86 %
Neutro Abs: 10.6 10*3/uL — ABNORMAL HIGH (ref 1.7–7.7)
Neutro Abs: 27.7 10*3/uL — ABNORMAL HIGH (ref 1.7–7.7)
PLATELETS: 165 10*3/uL (ref 150–400)
PLATELETS: 210 10*3/uL (ref 150–400)
RBC: 4.49 MIL/uL (ref 3.87–5.11)
RBC: 5.24 MIL/uL — AB (ref 3.87–5.11)
RDW: 14.1 % (ref 11.5–15.5)
RDW: 14.2 % (ref 11.5–15.5)
WBC MORPHOLOGY: INCREASED
WBC: 14.9 10*3/uL — AB (ref 4.0–10.5)
WBC: 32.2 10*3/uL — AB (ref 4.0–10.5)

## 2016-10-24 LAB — POCT I-STAT 3, ART BLOOD GAS (G3+)
ACID-BASE DEFICIT: 14 mmol/L — AB (ref 0.0–2.0)
Acid-base deficit: 8 mmol/L — ABNORMAL HIGH (ref 0.0–2.0)
Bicarbonate: 17.4 mmol/L — ABNORMAL LOW (ref 20.0–28.0)
Bicarbonate: 19.7 mmol/L — ABNORMAL LOW (ref 20.0–28.0)
O2 SAT: 100 %
O2 SAT: 90 %
PCO2 ART: 58.7 mmHg — AB (ref 32.0–48.0)
PH ART: 7.078 — AB (ref 7.350–7.450)
PH ART: 7.213 — AB (ref 7.350–7.450)
TCO2: 19 mmol/L (ref 0–100)
TCO2: 21 mmol/L (ref 0–100)
pCO2 arterial: 50.4 mmHg — ABNORMAL HIGH (ref 32.0–48.0)
pO2, Arterial: 263 mmHg — ABNORMAL HIGH (ref 83.0–108.0)
pO2, Arterial: 80 mmHg — ABNORMAL LOW (ref 83.0–108.0)

## 2016-10-24 LAB — HEPARIN LEVEL (UNFRACTIONATED)
HEPARIN UNFRACTIONATED: 0.12 [IU]/mL — AB (ref 0.30–0.70)
HEPARIN UNFRACTIONATED: 0.27 [IU]/mL — AB (ref 0.30–0.70)

## 2016-10-24 LAB — AMYLASE
AMYLASE: 37 U/L (ref 28–100)
Amylase: 47 U/L (ref 28–100)

## 2016-10-24 LAB — ACETAMINOPHEN LEVEL: ACETAMINOPHEN (TYLENOL), SERUM: 18 ug/mL (ref 10–30)

## 2016-10-24 LAB — RAPID URINE DRUG SCREEN, HOSP PERFORMED
AMPHETAMINES: NOT DETECTED
Barbiturates: NOT DETECTED
Benzodiazepines: NOT DETECTED
Cocaine: NOT DETECTED
Opiates: NOT DETECTED
TETRAHYDROCANNABINOL: POSITIVE — AB

## 2016-10-24 LAB — PROCALCITONIN
Procalcitonin: 35.44 ng/mL
Procalcitonin: 37.6 ng/mL

## 2016-10-24 LAB — HIV ANTIBODY (ROUTINE TESTING W REFLEX): HIV SCREEN 4TH GENERATION: NONREACTIVE

## 2016-10-24 LAB — MAGNESIUM: Magnesium: 2.2 mg/dL (ref 1.7–2.4)

## 2016-10-24 LAB — PHOSPHORUS: PHOSPHORUS: 9.9 mg/dL — AB (ref 2.5–4.6)

## 2016-10-24 LAB — PATHOLOGIST SMEAR REVIEW

## 2016-10-24 LAB — ETHANOL: Alcohol, Ethyl (B): <5 mg/dL (ref <5)

## 2016-10-24 LAB — CORTISOL: Cortisol, Plasma: 84.6 ug/dL

## 2016-10-24 LAB — AMMONIA: AMMONIA: 81 umol/L — AB (ref 9–35)

## 2016-10-24 LAB — MRSA PCR SCREENING: MRSA by PCR: NEGATIVE

## 2016-10-24 LAB — CK: CK TOTAL: 13687 U/L — AB (ref 38–234)

## 2016-10-24 MED ORDER — VANCOMYCIN HCL IN DEXTROSE 750-5 MG/150ML-% IV SOLN
750.0000 mg | INTRAVENOUS | Status: DC
  Administered 2016-10-24 – 2016-10-25 (×2): 750 mg via INTRAVENOUS
  Filled 2016-10-24 (×2): qty 150

## 2016-10-24 MED ORDER — SODIUM BICARBONATE 8.4 % IV SOLN
INTRAVENOUS | Status: AC
  Filled 2016-10-24: qty 100

## 2016-10-24 MED ORDER — SODIUM CHLORIDE 0.9 % IV SOLN
0.0000 ug/min | INTRAVENOUS | Status: DC
  Administered 2016-10-24: 400 ug/min via INTRAVENOUS
  Administered 2016-10-24: 75 ug/min via INTRAVENOUS
  Administered 2016-10-24: 400 ug/min via INTRAVENOUS
  Administered 2016-10-24: 325 ug/min via INTRAVENOUS
  Administered 2016-10-25: 300 ug/min via INTRAVENOUS
  Administered 2016-10-25: 200 ug/min via INTRAVENOUS
  Filled 2016-10-24 (×9): qty 4

## 2016-10-24 MED ORDER — PIPERACILLIN-TAZOBACTAM 3.375 G IVPB
3.3750 g | Freq: Three times a day (TID) | INTRAVENOUS | Status: DC
  Administered 2016-10-24 – 2016-10-26 (×6): 3.375 g via INTRAVENOUS
  Filled 2016-10-24 (×7): qty 50

## 2016-10-24 MED ORDER — MIDAZOLAM HCL 2 MG/2ML IJ SOLN
1.0000 mg | INTRAMUSCULAR | Status: DC | PRN
  Administered 2016-10-24 – 2016-10-25 (×4): 2 mg via INTRAVENOUS
  Administered 2016-10-25: 4 mg via INTRAVENOUS
  Filled 2016-10-24: qty 4
  Filled 2016-10-24 (×3): qty 2

## 2016-10-24 MED ORDER — FENTANYL BOLUS VIA INFUSION
25.0000 ug | INTRAVENOUS | Status: DC | PRN
  Administered 2016-10-25: 50 ug via INTRAVENOUS
  Filled 2016-10-24: qty 50

## 2016-10-24 MED ORDER — DEXTROSE 10 % IV SOLN
INTRAVENOUS | Status: DC
  Administered 2016-10-24: 06:00:00 via INTRAVENOUS

## 2016-10-24 MED ORDER — HEPARIN (PORCINE) IN NACL 100-0.45 UNIT/ML-% IJ SOLN
2250.0000 [IU]/h | INTRAMUSCULAR | Status: DC
  Administered 2016-10-24: 1350 [IU]/h via INTRAVENOUS
  Administered 2016-10-25: 2250 [IU]/h via INTRAVENOUS
  Administered 2016-10-25: 1850 [IU]/h via INTRAVENOUS
  Filled 2016-10-24 (×7): qty 250

## 2016-10-24 MED ORDER — IOPAMIDOL (ISOVUE-370) INJECTION 76%
INTRAVENOUS | Status: AC
  Filled 2016-10-24: qty 100

## 2016-10-24 MED ORDER — SODIUM CHLORIDE 0.9% FLUSH: 10.0000 mL | INTRAVENOUS | Status: DC | PRN

## 2016-10-24 MED ORDER — CHLORHEXIDINE GLUCONATE 0.12% ORAL RINSE (MEDLINE KIT)
15.0000 mL | Freq: Two times a day (BID) | OROMUCOSAL | Status: DC
  Administered 2016-10-24 – 2016-10-27 (×7): 15 mL via OROMUCOSAL

## 2016-10-24 MED ORDER — HYDROCORTISONE NA SUCCINATE PF 100 MG IJ SOLR
50.0000 mg | Freq: Four times a day (QID) | INTRAMUSCULAR | Status: DC
  Administered 2016-10-24 (×2): 50 mg via INTRAVENOUS
  Filled 2016-10-24: qty 1
  Filled 2016-10-24 (×2): qty 2

## 2016-10-24 MED ORDER — SODIUM BICARBONATE 8.4 % IV SOLN
100.0000 meq | Freq: Once | INTRAVENOUS | Status: AC
  Administered 2016-10-24: 100 meq via INTRAVENOUS

## 2016-10-24 MED ORDER — DEXTROSE 50 % IV SOLN
INTRAVENOUS | Status: AC
  Administered 2016-10-24: 50 mL
  Filled 2016-10-24: qty 50

## 2016-10-24 MED ORDER — SODIUM BICARBONATE 8.4 % IV SOLN
INTRAVENOUS | Status: AC
  Administered 2016-10-24: 50 meq via INTRAVENOUS
  Filled 2016-10-24: qty 100

## 2016-10-24 MED ORDER — DEXTROSE 5 % IV SOLN
0.0000 ug/min | INTRAVENOUS | Status: DC
  Administered 2016-10-24: 40 ug/min via INTRAVENOUS
  Administered 2016-10-25: 18 ug/min via INTRAVENOUS
  Administered 2016-10-25: 24 ug/min via INTRAVENOUS
  Administered 2016-10-26 – 2016-10-27 (×6): 14 ug/min via INTRAVENOUS
  Filled 2016-10-24 (×6): qty 16

## 2016-10-24 MED ORDER — CHLORHEXIDINE GLUCONATE CLOTH 2 % EX PADS
6.0000 | MEDICATED_PAD | Freq: Every day | CUTANEOUS | Status: DC
  Administered 2016-10-24 – 2016-10-27 (×3): 6 via TOPICAL

## 2016-10-24 MED ORDER — HEPARIN BOLUS VIA INFUSION
2000.0000 [IU] | Freq: Once | INTRAVENOUS | Status: AC
  Administered 2016-10-24: 2000 [IU] via INTRAVENOUS
  Filled 2016-10-24: qty 2000

## 2016-10-24 MED ORDER — SODIUM CHLORIDE 0.9% FLUSH
10.0000 mL | Freq: Two times a day (BID) | INTRAVENOUS | Status: DC
  Administered 2016-10-24 – 2016-10-26 (×5): 10 mL

## 2016-10-24 MED ORDER — ORAL CARE MOUTH RINSE
15.0000 mL | Freq: Four times a day (QID) | OROMUCOSAL | Status: DC
  Administered 2016-10-24 – 2016-10-27 (×15): 15 mL via OROMUCOSAL

## 2016-10-24 MED ORDER — SODIUM CHLORIDE 0.9 % IV SOLN
1.0000 g | Freq: Once | INTRAVENOUS | Status: AC
  Administered 2016-10-24: 1 g via INTRAVENOUS
  Filled 2016-10-24: qty 10

## 2016-10-24 MED ORDER — SODIUM BICARBONATE 8.4 % IV SOLN
INTRAVENOUS | Status: DC
  Administered 2016-10-24 – 2016-10-27 (×9): via INTRAVENOUS
  Filled 2016-10-24 (×19): qty 150

## 2016-10-24 MED ORDER — DEXTROSE 5 % IV SOLN
Freq: Once | INTRAVENOUS | Status: AC
  Administered 2016-10-25: via INTRAVENOUS
  Filled 2016-10-24: qty 150

## 2016-10-24 MED ORDER — DEXTROSE 5 % IV SOLN
0.0000 ug/min | INTRAVENOUS | Status: DC
  Administered 2016-10-24: 17 ug/min via INTRAVENOUS
  Administered 2016-10-24: 16 ug/min via INTRAVENOUS
  Filled 2016-10-24 (×5): qty 4

## 2016-10-24 MED ORDER — VITAMIN K1 10 MG/ML IJ SOLN: 5.0000 mg | Freq: Once | INTRAVENOUS | Status: DC

## 2016-10-24 MED ORDER — VASOPRESSIN 20 UNIT/ML IV SOLN
0.0400 [IU]/min | INTRAVENOUS | Status: DC
  Administered 2016-10-24 – 2016-10-26 (×4): 0.03 [IU]/min via INTRAVENOUS
  Filled 2016-10-24 (×5): qty 2

## 2016-10-24 MED ORDER — VANCOMYCIN 50 MG/ML ORAL SOLUTION
500.0000 mg | Freq: Four times a day (QID) | ORAL | Status: DC
  Administered 2016-10-24 (×2): 500 mg
  Filled 2016-10-24 (×3): qty 10

## 2016-10-24 MED ORDER — SODIUM BICARBONATE 8.4 % IV SOLN
50.0000 meq | Freq: Once | INTRAVENOUS | Status: AC
  Administered 2016-10-24: 50 meq via INTRAVENOUS
  Filled 2016-10-24: qty 50

## 2016-10-24 MED ORDER — CEFAZOLIN SODIUM-DEXTROSE 2-4 GM/100ML-% IV SOLN
2.0000 g | Freq: Two times a day (BID) | INTRAVENOUS | Status: DC
  Administered 2016-10-24: 2 g via INTRAVENOUS
  Filled 2016-10-24: qty 100

## 2016-10-24 MED ORDER — ALBUMIN HUMAN 25 % IV SOLN
25.0000 g | INTRAVENOUS | Status: DC
  Administered 2016-10-24 (×3): 25 g via INTRAVENOUS
  Filled 2016-10-24: qty 100
  Filled 2016-10-24: qty 50
  Filled 2016-10-24: qty 100
  Filled 2016-10-24: qty 150

## 2016-10-24 MED ORDER — HEPARIN BOLUS VIA INFUSION
4000.0000 [IU] | Freq: Once | INTRAVENOUS | Status: AC
  Administered 2016-10-24: 4000 [IU] via INTRAVENOUS
  Filled 2016-10-24: qty 4000

## 2016-10-24 MED ORDER — SODIUM BICARBONATE 8.4 % IV SOLN
100.0000 meq | Freq: Once | INTRAVENOUS | Status: AC
  Administered 2016-10-24: 100 meq via INTRAVENOUS
  Filled 2016-10-24: qty 100

## 2016-10-24 NOTE — Progress Notes (Signed)
RT attempted another A-line due to current A-line not correlating with BP per MD. Attempt was made X 2 by RT E link aware as well as Charity fundraiserN.

## 2016-10-24 NOTE — Care Management (Signed)
CM spoke directly with pharmacy and attending - pt will not discharge home on Lovenox therefore CM instructed to disregard medication assistance consult.  CM will continue to follow for discharge needs

## 2016-10-24 NOTE — Progress Notes (Signed)
Pharmacy Antibiotic Note  Mallory BoltJatina W Medina is a 51 y.o. female admitted on 10/17/2016 with sepsis.  Pharmacy has been consulted for cefazolin dosing. Pharmacy was previously consulted for vancomycin and zosyn dosing- both now discontinued. BCID results this morning resulted in MSSA and antibiotic therapy narrowed to cefazolin with consult to pharmacy to dose. Patient is also currently taking vancomycin PO for suspected C diff, awaiting results.   Plan:  Cefazolin IV 2 gram q12h  Continue to monitor clinical progress and follow pending cultures   Height: 5\' 3"  (160 cm) Weight: 164 lb 14.5 oz (74.8 kg) IBW/kg (Calculated) : 52.4  Temp (24hrs), Avg:97.4 F (36.3 C), Min:96.2 F (35.7 C), Max:98.2 F (36.8 C)   Recent Labs Lab 10/20/16 1047 10/25/2016 1513 10/31/2016 1528 10/19/2016 2338 10/24/16 0448  WBC 18.0* 18.7*  --  14.9* 15.6*  CREATININE 0.96 2.42* 2.00* 2.33* 2.36*  LATICACIDVEN  --   --  16.58* 10.4*  --     Estimated Creatinine Clearance: 27.3 mL/min (A) (by C-G formula based on SCr of 2.36 mg/dL (H)).    Allergies  Allergen Reactions  . Sulfa Antibiotics Nausea And Vomiting    Stomach ache    Antimicrobials this admission:  Vanc 8/7>>8/8 Zosyn 8/7>>8/8 Vanc PO 8/7>>     Microbiology results:  08/07 Blood: MSSA   08/07 Urine: pending  08/07: MRSA PCR: neg 08/07 C. Diff: pending 08/07 GI panel: pending  Thank you for allowing pharmacy to be a part of this patient's care.  Blake DivineShannon Sanam Marmo, Pharm.D. PGY1 Pharmacy Resident 10/24/2016 8:57 AM Main Pharmacy: [phone]

## 2016-10-24 NOTE — Progress Notes (Signed)
  Echocardiogram 2D Echocardiogram has been performed.  Mallory Medina 10/24/2016, 1:52 AM

## 2016-10-24 NOTE — Progress Notes (Signed)
eLink Physician-Brief Progress Note Patient Name: Dwan BoltJatina W Coatney DOB: 1966-02-19 MRN: [id number]   Date of Service  10/24/2016  HPI/Events of Note  Hypoglycemic.  eICU Interventions  Will add dextrose to IV fluids.     Intervention Category Major Interventions: Other:  Elenore Wanninger 10/24/2016, 4:41 AM

## 2016-10-24 NOTE — Progress Notes (Signed)
ANTICOAGULATION CONSULT NOTE - Initial Consult  Pharmacy Consult for Heparin Indication: pulmonary embolus  Allergies  Allergen Reactions  . Sulfa Antibiotics Nausea And Vomiting    Stomach ache    Patient Measurements: Height: 5\' 3"  (160 cm) Weight: 164 lb 14.5 oz (74.8 kg) IBW/kg (Calculated) : 52.4 Heparin Dosing Weight: 70 kg  Vital Signs: Temp: 97.5 F (36.4 C) (08/08 0835) Temp Source: Oral (08/08 0835) BP: 114/79 (08/08 0901) Pulse Rate: 146 (08/08 0901)  Labs:  Recent Labs  June 04, 2016 1513 June 04, 2016 1528 June 04, 2016 2338 10/24/16 0448 10/24/16 0844  HGB 18.8* 21.1* 15.7* 14.9  --   HCT 53.8* 62.0* 47.2* 44.6  --   PLT 298  --  210 198  --   LABPROT 23.5*  --   --   --   --   INR 2.06  --   --   --   --   HEPARINUNFRC  --   --   --   --  0.12*  CREATININE 2.42* 2.00* 2.33* 2.36*  --     Estimated Creatinine Clearance: 27.3 mL/min (A) (by C-G formula based on SCr of 2.36 mg/dL (H)).  Medical History: Past Medical History:  Diagnosis Date  . Back pain, chronic   . COPD (chronic obstructive pulmonary disease) (HCC)   . 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 Hepatitis C   . Hypertension   . IV drug abuse recovery   hx of    Assessment: 51 y.o. female with MSSA bacteremia and possible PE. Heparin was started overnight and held for some time due to bloody emesis after CPR and then resumed due to the concern for PE. Heparin level this morning was subtherapeutic. No other issues noted with heparin drip at this time per nurse. CBC within normal limits.   Goal of Therapy:  Heparin level 0.3-0.7 units/ml Monitor platelets by anticoagulation protocol: Yes   Plan:  Bolus 2000 units  Increase heparin drip to 1700 units/hr Check heparin level in 6 hours  Will continue to monitor for signs and symptoms of bleeding  Blake DivineShannon Arianny Pun, Pharm.D. PGY1 Pharmacy Resident 10/24/2016 10:16 AM Main Pharmacy: [phone]

## 2016-10-24 NOTE — Progress Notes (Signed)
Increased Fi02 back to 100% due to low Spo2 and ABG results.  Will continue to monitor patient.

## 2016-10-24 NOTE — Progress Notes (Signed)
Name: Mallory Medina MRN: [id number] DOB: 1966-01-08    ADMISSION DATE:  10/21/2016 CONSULTATION DATE:  10/17/2016  REFERRING MD :  Dr. Madilyn Hook   CHIEF COMPLAINT:  Hypotension/Sepsis   HISTORY OF PRESENT ILLNESS:  51 y.o. female current smoker with PMH of Chronic Pain Back, COPD, DM, Hepatitis C, HTN, IV drug abuse, non-compliant with medications due to financial restraints. Presented to ED on 8/7 with lethargy and hypotension. Upon EMS arrival oxygen saturation 78% on room air. Upon arrival to ED BP 97/86, WBC 18.7, LA 16.58, ABG 7.1/30.1/241. Given 3L NS. Mental Status began to improve. Patient stated that she has a chronic productive cough with black to yellow sputum, chronic dyspnea, several days of burning will urinating, and generalized pain/fatiuge. Has been bed bound for the last 3 days, reports N/V/D during this time as well. PCCM asked to admit. Presented to ED on 8/4 with upper back pain with dyspnea and a productive cough for 3 says. CXR not revealing of PNA, discharged with bronchodilators, steroids, and azithromycin.  SUBJECTIVE: Since admission patient has remained in shock with PEA arrest. Patient's lactic acid improving.   REVIEW OF SYSTEMS:  Unable to obtain given altered mentation & intubation.   VITAL SIGNS: Temp:  [96.2 F (35.7 C)-98.2 F (36.8 C)] 97.5 F (36.4 C) (08/08 0835) Pulse Rate:  [38-159] 146 (08/08 0901) Resp:  [18-38] 30 (08/08 0901) BP: (64-139)/(41-100) 114/79 (08/08 0901) SpO2:  [52 %-100 %] 97 % (08/08 0901) Arterial Line BP: (75-143)/(45-100) 121/80 (08/08 0800) FiO2 (%):  [60 %-100 %] 80 % (08/08 0901) Weight:  [164 lb 14.5 oz (74.8 kg)-175 lb 0.7 oz (79.4 kg)] 164 lb 14.5 oz (74.8 kg) (08/08 0447)  PHYSICAL EXAMINATION: General:  No acute distress. No family at bedside.  Integument:  Warm & dry. Mottling over lower abdomen & bruise on left flank. Rash in navel.  Extremities:  No cyanosis or clubbing.  HEENT:  Moist mucus membranes. No scleral  injection or icterus. Endotracheal tube in place.  Cardiovascular:  Regular rhythm. No edema. No appreciable JVD.  Pulmonary:  Good aeration & clear to auscultation bilaterally. Symmetric chest wall expansion. No accessory muscle use. Abdomen: Soft. Normal bowel sounds. Protuberant. Musculoskeletal:  Normal bulk and tone. No joint deformity or effusion appreciated. Neurological:  Intermittently wiggling toes and opening eyes. Not following commands.    Recent Labs Lab 11/08/2016 1513 10/22/2016 1528 10/24/2016 2338 10/24/16 0448  NA 128* 128* 130* 132*  K 4.8 4.3 4.0 3.7  CL 84* 91* 97* 95*  CO2 15*  --  16* 15*  BUN 36* 45* 38* 39*  CREATININE 2.42* 2.00* 2.33* 2.36*  GLUCOSE 124* 117* 48* 152*    Recent Labs Lab 11/16/2016 1513 10/26/2016 1528 10/25/2016 2338 10/24/16 0448  HGB 18.8* 21.1* 15.7* 14.9  HCT 53.8* 62.0* 47.2* 44.6  WBC 18.7*  --  14.9* 15.6*  PLT 298  --  210 198   Ct Abdomen Pelvis Wo Contrast  Result Date: 11/14/2016 CLINICAL DATA:  51 y/o  F; lactic acidosis and admitted for sepsis. EXAM: CT CHEST, ABDOMEN AND PELVIS WITHOUT CONTRAST TECHNIQUE: Multidetector CT imaging of the chest, abdomen and pelvis was performed following the standard protocol without IV contrast. COMPARISON:  11/27/2012 CT of the chest. 10/20/2015 chest radiograph. FINDINGS: CT CHEST FINDINGS Cardiovascular: No significant vascular findings. Normal heart size. No pericardial effusion. Mild calcific atherosclerosis of the thoracic aorta. Mediastinum/Nodes: Mildly patulous and fluid-filled esophagus. No adenopathy. Normal thyroid gland. Lungs/Pleura: No consolidation or  pneumothorax. Patent central airways. Trace right pleural effusion. Musculoskeletal: Numerous bilateral posterior rib fractures, several healed with callus formation, several with productive changes and nonunion. No acute bone fracture identified. Edema within subcutaneous fat of the chest wall asymmetrically on the right and posteriorly.  CT ABDOMEN PELVIS FINDINGS Hepatobiliary: No focal liver abnormality is seen. No gallstones, gallbladder wall thickening, or biliary dilatation. Pancreas: Unremarkable. No pancreatic ductal dilatation or surrounding inflammatory changes. Spleen: The spleen measures 9.5 x 5.5 x 15.7 cm (volume = 430 cm^3)(AP x ML x CC series 3, image 51 and series 7, image 71) . Adrenals/Urinary Tract: Adrenal glands are unremarkable. Kidneys are normal, without renal calculi, focal lesion, or hydronephrosis. Bladder is unremarkable. Stomach/Bowel: Stomach is within normal limits. Appendix not identified. No bowel wall thickening or inflammatory changes. Mild nonspecific distention of the transverse colon. Vascular/Lymphatic: Aortic atherosclerosis with moderate calcific atherosclerosis. No enlarged abdominal or pelvic lymph nodes. Reproductive: Uterus and bilateral adnexa are unremarkable. Other: No abdominal wall hernia or abnormality. No abdominopelvic ascites. Musculoskeletal: L4-S1 posterior instrumented fusion, lateral mass fusion, and laminectomy. No periprosthetic fracture or lucency identified. Stable severe compression deformity of T11 vertebral body with 6 mm retropulsion of superior endplate. Stable chronic superior endplate fracture of T3 vertebral body with mild loss of height. Edema within the subcutaneous fat of the abdominal wall asymmetrically to the right and posteriorly. IMPRESSION: 1. Suboptimal assessment of soft tissues without intravenous contrast. 2. Trace right pleural effusion.  No consolidation. 3. Mildly patulous and fluid-filled esophagus. 4. Splenomegaly, volume of 430 cc. 5. Mild nonspecific distention of the transverse colon without appreciable inflammatory change. 6. Mild thoracic and moderate abdominal aortic calcific atherosclerosis. 7. Stable severe T11 compression deformity with 6 mm retropulsion superior endplate mild T3 compression deformity. 8. Multiple chronic rib fracture deformities. 9.  Edema in subcutaneous fat of right lateral and posterior thoracoabdominal walls, probably third-spacing with asymmetry due to patient positioning. Electronically Signed   By: Mitzi Hansen M.D.   On: 10/22/2016 17:32   Ct Head Wo Contrast  Result Date: 10/29/2016 CLINICAL DATA:  Encephalopathy. Unresponsive. Status post resuscitation. EXAM: CT HEAD WITHOUT CONTRAST TECHNIQUE: Contiguous axial images were obtained from the base of the skull through the vertex without intravenous contrast. COMPARISON:  10/20/2015 FINDINGS: Brain: No evidence of acute infarction, hemorrhage, hydrocephalus, extra-axial collection or mass lesion/mass effect. Vascular: No hyperdense vessel or unexpected calcification. Skull: Normal. Negative for fracture or focal lesion. Sinuses/Orbits: No acute finding. Other: None. IMPRESSION: No acute findings by head CT. Electronically Signed   By: Irish Lack M.D.   On: 11/02/2016 22:49   Ct Chest Wo Contrast  Result Date: 10/22/2016 CLINICAL DATA:  51 y/o  F; lactic acidosis and admitted for sepsis. EXAM: CT CHEST, ABDOMEN AND PELVIS WITHOUT CONTRAST TECHNIQUE: Multidetector CT imaging of the chest, abdomen and pelvis was performed following the standard protocol without IV contrast. COMPARISON:  11/27/2012 CT of the chest. 10/20/2015 chest radiograph. FINDINGS: CT CHEST FINDINGS Cardiovascular: No significant vascular findings. Normal heart size. No pericardial effusion. Mild calcific atherosclerosis of the thoracic aorta. Mediastinum/Nodes: Mildly patulous and fluid-filled esophagus. No adenopathy. Normal thyroid gland. Lungs/Pleura: No consolidation or pneumothorax. Patent central airways. Trace right pleural effusion. Musculoskeletal: Numerous bilateral posterior rib fractures, several healed with callus formation, several with productive changes and nonunion. No acute bone fracture identified. Edema within subcutaneous fat of the chest wall asymmetrically on the right and  posteriorly. CT ABDOMEN PELVIS FINDINGS Hepatobiliary: No focal liver abnormality is seen. No gallstones, gallbladder  wall thickening, or biliary dilatation. Pancreas: Unremarkable. No pancreatic ductal dilatation or surrounding inflammatory changes. Spleen: The spleen measures 9.5 x 5.5 x 15.7 cm (volume = 430 cm^3)(AP x ML x CC series 3, image 51 and series 7, image 71) . Adrenals/Urinary Tract: Adrenal glands are unremarkable. Kidneys are normal, without renal calculi, focal lesion, or hydronephrosis. Bladder is unremarkable. Stomach/Bowel: Stomach is within normal limits. Appendix not identified. No bowel wall thickening or inflammatory changes. Mild nonspecific distention of the transverse colon. Vascular/Lymphatic: Aortic atherosclerosis with moderate calcific atherosclerosis. No enlarged abdominal or pelvic lymph nodes. Reproductive: Uterus and bilateral adnexa are unremarkable. Other: No abdominal wall hernia or abnormality. No abdominopelvic ascites. Musculoskeletal: L4-S1 posterior instrumented fusion, lateral mass fusion, and laminectomy. No periprosthetic fracture or lucency identified. Stable severe compression deformity of T11 vertebral body with 6 mm retropulsion of superior endplate. Stable chronic superior endplate fracture of T3 vertebral body with mild loss of height. Edema within the subcutaneous fat of the abdominal wall asymmetrically to the right and posteriorly. IMPRESSION: 1. Suboptimal assessment of soft tissues without intravenous contrast. 2. Trace right pleural effusion.  No consolidation. 3. Mildly patulous and fluid-filled esophagus. 4. Splenomegaly, volume of 430 cc. 5. Mild nonspecific distention of the transverse colon without appreciable inflammatory change. 6. Mild thoracic and moderate abdominal aortic calcific atherosclerosis. 7. Stable severe T11 compression deformity with 6 mm retropulsion superior endplate mild T3 compression deformity. 8. Multiple chronic rib fracture  deformities. 9. Edema in subcutaneous fat of right lateral and posterior thoracoabdominal walls, probably third-spacing with asymmetry due to patient positioning. Electronically Signed   By: Mitzi Hansen M.D.   On: 10/26/2016 17:32   Dg Chest Port 1 View  Result Date: 10/24/2016 CLINICAL DATA:  Ventilator dependent. EXAM: PORTABLE CHEST 1 VIEW COMPARISON:  Radiographs of October 23, 2016. FINDINGS: The heart size and mediastinal contours are within normal limits. Endotracheal and nasogastric tubes are in grossly good position. Left internal jugular catheter is stable with distal tip in expected position of the SVC. No pneumothorax or pleural effusion is noted. Right lung is clear. Stable left basilar subsegmental atelectasis. The visualized skeletal structures are unremarkable. IMPRESSION: Stable support apparatus. Stable left basilar subsegmental atelectasis. Electronically Signed   By: Lupita Raider, M.D.   On: 10/24/2016 07:53   Dg Chest Portable 1 View  Result Date: 11/01/2016 CLINICAL DATA:  Central line placement. History of COPD, drug abuse. EXAM: PORTABLE CHEST 1 VIEW COMPARISON:  Chest radiograph October 23, 2016 at 2105 hours FINDINGS: Endotracheal tube tip projects 3 cm above the carina, retracted from prior radiograph. Nasogastric tube past proximal stomach, distal tip not included. Multiple lines and wires overlie the patient. Cardiomediastinal silhouette is normal. Pulmonary vascular congestion without pleural effusion or focal consolidation. No pneumothorax. Soft tissue planes and included osseous structures are nonsuspicious. IMPRESSION: Retracted endotracheal tube tip projects 3 cm above the carina. Nasogastric tube past proximal stomach, distal tip not included. Pulmonary vascular congestion. Electronically Signed   By: Awilda Metro M.D.   On: 10/20/2016 22:00   Dg Chest Portable 1 View  Result Date: 10/28/2016 CLINICAL DATA:  Intubation.  Possible sepsis. EXAM: PORTABLE  CHEST 1 VIEW COMPARISON:  None. FINDINGS: The endotracheal tube is in the right mainstem bronchus. There is leftward shift of the mediastinal structures. The right lung is clear. Otherwise limited assessment due to multiple overlying structures. IMPRESSION: Right mainstem bronchus intubation. These results were called by telephone at the time of interpretation on 11/13/2016 at 9:25 pm.  At that time, per the clinical team, the endotracheal tube had been repositioned. Electronically Signed   By: Deatra RobinsonKevin  Herman M.D.   On: 10/01/2016 21:26   Dg Chest Port 1 View  Result Date: 10/20/2016 CLINICAL DATA:  Patient with shortness of breath. EXAM: PORTABLE CHEST 1 VIEW COMPARISON:  Chest radiograph 10/20/2016 FINDINGS: Monitoring leads overlie the patient. Patient is rotated to the left. Stable enlarged cardiac and mediastinal contours. No consolidative pulmonary opacities. No pleural effusion or pneumothorax. IMPRESSION: Cardiomegaly.  No acute cardiopulmonary process. Electronically Signed   By: Annia Beltrew  Davis M.D.   On: 10/01/2016 15:46   STUDIES:  UDS 8/7:  Positive for THC CT HEAD W/O 8/7:  No acute findings by head CT. CTA CHEST 8/8: IMPRESSION: 1. Isolated acute subsegmental pulmonary embolism in the right lower lobe. 2. Well-positioned support structures. 3. Mild atelectasis in the dependent and basilar lungs bilaterally. 4. Anterior right fifth rib acute fracture. Multiple healing bilateral posterior rib fractures bilaterally. Stable T3 and T11 vertebral compression fractures. TTE 8/8:  Patient tachycardic therefore unable to give accurate assessment of EF. Overall left ventricular function appears globally reduced with normal cavity size. LA & RA normal in size. Questionable thrombus within RV with poor visualization. Cavity size of RV appeared normal with mild reduction in systolic function. Aortic valve poorly visualized but no obvious stenosis or regurgitation. Aortic root normal in size. No mitral  stenosis or regurgitation. No pulmonic stenosis or regurgitation. No tricuspid regurgitation. Small free-flowing pericardial effusion at the apex. BILATERAL LE VENOUS DUPLEX 8/8 >>>  Preliminary negative for DVT/SVT.  PORT CXR 8/8:  Personally reviewed by me. No focal opacity appreciated. No pleural effusion. Left internal jugular central venous catheter in good position. Endotracheal tube in good position. Enteric feeding tube coursing below diaphragm.  MICROBIOLOGY: MRSA PCR 8/7:  Negative Blood Cultures x2 8/7 >>> MSSA Urine Culture 8/7 >>>  ANTIBIOTICS: Ancef 8/8 (x1 dose) Vancomycin 8/7 >> Zosyn 8/7 >>   SIGNIFICANT EVENTS:  8/07 - Presents to ED  8/08 - PEA arrest  LINES/TUBES: OETT 7.5 8/7 >>> L IJ CVL 8/7 >>> R RAD ART LINE >>> OGT 8/7 >>> Foley 8/7 >>> PIV  ASSESSMENT / PLAN:  PULMONARY A: Acute Hypoxic Respiratory Failure Acute Pulmonary Embolism:  Right lower lobe. Nondisplaced Rib Fractures:  Seen on chest imaging post resuscitation. History of COPD  P:   Full Ventilator Support Goal Sat >92% Holding on SBT & PS weaning until patient stabilizes Heparin drip per pharmacy protocol Discontinuing Brovana Continuing Pulmicort nebs bid & Duoneb q6hr   CARDIOVASCULAR A:  Shock:  Multifactorial from sepsis & PE. Tachycardia:  Multifactorial in etiology. RV Thrombus:  Questionable seen on sub-optimal TTE 8/8. H/O Essential hypertension  P:  Continuous telemetry monitoring Monitoring vitals per unit protocol Weaning pressors for MAP >65 & SBP >90 Plan to repeat TTE vs TEE once patient stabilizes   RENAL A:   Acute Renal Failure Metabolic/Lactic Acidosis:  Improving on Bicarb drip. Hyponatremia:  Mild.   P:   Trending UOP with Foley Monitoring Electrolytes & Renal function Closely Continuing Bicarb gtt @ 125 cc/hr Repeat ABG at 5pm today & tomorrow AM Trending Lactic Acid  GASTROINTESTINAL A:   GIB:  Coffee ground stomach contents. Reported  Diarrhea:  Prior to arrival but non since admission. Transaminintis:  Mild.   P:   NPO. Holding on tube feedings Continuing Protonix IV q12hr D/C C diff Checking Complete Abdominal U/S  HEMATOLOGIC A:   GI Bleed:  Appears upper. Acute Pulmonary Embolism RV Thrombus:  Questionable. Leukocytosis:  Likely secondary to sepsis. Coagulopathy:  Likely due to cirrhosis.  P:  Heparin drip per pharmacy protocol Trending cell counts q12hr Transfuse for Hgb <7.0 or bright red blood Trending INR daily  INFECTIOUS A:   MSSA Bacteremia Sepsis H/O Hepatitis C Viral Infection   P:   Continuing Empiric Vancomycin & Zosyn pending further culture results Trending Procalcitonin per algorithm Awaiting culture results   ENDOCRINE A:   Hypoglycemia:  Improved on dextrose IVF. H/O Diabetes Mellitus   P:   Continuing d10 at 50cc/hr IV Continuing d5 in bicarb drip Accu-checks q4hr with MD notification parameters  NEUROLOGIC A:   Acute Encephalopathy:  Multifactorial with likely a component of anoxic injury post arrest and toxic metabolic. Sedation on Ventilator Analgesia for rib fractures Chronic Pain History of Overdose & Suicidal Ideation History of IVDU  P:   RASS goal: 0 to -1 Fentanyl gtt & IV prn pain Versed IV prn sedation  FAMILY  - Updates:  No family at bedside at the time of my exam.   - Inter-disciplinary family meet or Palliative Care meeting due by:  8/14  DISCUSSION:  51 y.o. female with multisystem organ failure. Shock slowly improving. Acidosis slowly improving. Prognosis remains guarded. Continuing broad-spectrum antibiotics and systemic anticoagulation.  I have spent an additional total of 44 minutes of critical care time today caring for the patient and reviewing the patient's electronic medical record.   Donna Christen Jamison Neighbor, M.D. [facility] Pulmonary & Critical Care Pager:  [phone] After 3pm or if no response, call [phone] 10:54 AM 10/24/16

## 2016-10-24 NOTE — Progress Notes (Signed)
PHARMACY - PHYSICIAN COMMUNICATION CRITICAL VALUE ALERT - BLOOD CULTURE IDENTIFICATION (BCID)  Results for orders placed or performed during the hospital encounter of 15-May-2016  Blood Culture ID Panel (Reflexed) (Collected: 10/20/2016  4:15 PM)  Result Value Ref Range   Enterococcus species NOT DETECTED NOT DETECTED   Listeria monocytogenes NOT DETECTED NOT DETECTED   Staphylococcus species DETECTED (A) NOT DETECTED   Staphylococcus aureus DETECTED (A) NOT DETECTED   Methicillin resistance NOT DETECTED NOT DETECTED   Streptococcus species NOT DETECTED NOT DETECTED   Streptococcus agalactiae NOT DETECTED NOT DETECTED   Streptococcus pneumoniae NOT DETECTED NOT DETECTED   Streptococcus pyogenes NOT DETECTED NOT DETECTED   Acinetobacter baumannii NOT DETECTED NOT DETECTED   Enterobacteriaceae species NOT DETECTED NOT DETECTED   Enterobacter cloacae complex NOT DETECTED NOT DETECTED   Escherichia coli NOT DETECTED NOT DETECTED   Klebsiella oxytoca NOT DETECTED NOT DETECTED   Klebsiella pneumoniae NOT DETECTED NOT DETECTED   Proteus species NOT DETECTED NOT DETECTED   Serratia marcescens NOT DETECTED NOT DETECTED   Haemophilus influenzae NOT DETECTED NOT DETECTED   Neisseria meningitidis NOT DETECTED NOT DETECTED   Pseudomonas aeruginosa NOT DETECTED NOT DETECTED   Candida albicans NOT DETECTED NOT DETECTED   Candida glabrata NOT DETECTED NOT DETECTED   Candida krusei NOT DETECTED NOT DETECTED   Candida parapsilosis NOT DETECTED NOT DETECTED   Candida tropicalis NOT DETECTED NOT DETECTED    Name of physician (or Provider) Contacted: Dr Algis LimingVanDam  Changes to prescribed antibiotics required: dc vanc zosyn, Start ancef  Isaac BlissMichael Jonea Bukowski, PharmD, BCPS, BCCCP Clinical Pharmacist Clinical phone for 10/24/2016 from 7a-3:30p: [phone] If after 3:30p, please call main pharmacy at: [phone] 10/24/2016 8:38 AM

## 2016-10-24 NOTE — Consult Note (Signed)
[facility] for Infectious Disease    Date of Admission:  11/11/2016   Total days of antibiotics 2        Day 2 IV Vancomycin        Day 2 PO Vancomycin (stopped 8/8)        Day 2 Zosyn        Reason for Consult: Auto consult due to MSSA bacteremia    Referring Provider: Dr. Ralene Bathe Primary Care Provider:   Assessment and Plan:  MSSA Bacteremia Sepsis Patient biofire result is positive for MSSA in the blood.  -Continue to monitor blood culture result. Culture from 8/7 shows growth of gram positive cocci in 1/2 plates so far -Pending respiratory culture -Can de-escalate vancomycin and zosyn to cefazolin due to mssa result -Urine culture pending -Procalcitonin=35.44, markedly elevated -Lactic acid=10.4, continues to downtrend -Ammonia=81 -TTE was unable to obtain ef due to poor quality. LVF appeared globally reduced, possible thrombus in RV apex, pa pressure could not be adequately estimated. Consider TEE.  Source: -Pt has been complaining of burning on urinating. UA does not have any nitrites, wbc=6-30, and few bacteria which is unlikely for uti. 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 Pt's daughter states that she did use cocaine approximately 3 days ago. UDS positive for THC -Skin breakdown present at the umbilicus       [facility] Staphylococcus aureus bacteremia   Staphylococcus aureus bacteremia (SAB) is associated with a high rate of complications and mortality.  Specific aspects of clinical management are critical to optimizing the outcome of patients with SAB.  Therefore, the [facility] Antimicrobial Management Team [facility]) has initiated an intervention aimed at improving the management of SAB at [facility].  To do so, Infectious Diseases physicians are providing an evidence-based consult for the management of all patients with SAB.     Yes No Comments  Perform follow-up blood cultures (even if the patient  is afebrile) to ensure clearance of bacteremia [x]  []    Remove vascular catheter and obtain follow-up blood cultures after the removal of the catheter []  [x]  Currently needs catheter  Perform echocardiography to evaluate for endocarditis (transthoracic ECHO is 40-50% sensitive, TEE is > 90% sensitive) [x]  []  Please keep in mind, that neither test can definitively EXCLUDE endocarditis, and that should clinical suspicion remain high for endocarditis the patient should then still be treated with an "endocarditis" duration of therapy = 6 weeks  Consult electrophysiologist to evaluate implanted cardiac device (pacemaker, ICD) []  [x]  Does not have pacer or icd  Ensure source control []  []  Have all abscesses been drained effectively? Have deep seeded infections (septic joints or osteomyelitis) had appropriate surgical debridement?  Investigate for "metastatic" sites of infection []  []  Does the patient have ANY symptom or physical exam finding that would suggest a deeper infection (back or neck pain that may be suggestive of vertebral osteomyelitis or epidural abscess, muscle pain that could be a symptom of pyomyositis)?  Keep in mind that for deep seeded infections MRI imaging with contrast is preferred rather than other often insensitive tests such as plain x-rays, especially early in a patient's presentation.  Change antibiotic therapy to __________________ [x]  []  Beta-lactam antibiotics are preferred for MSSA due to higher cure rates.   If on Vancomycin, goal trough should be 15 - 20 mcg/mL  Estimated duration of IV antibiotic therapy:   []  []  Consult case management for probably prolonged outpatient IV  antibiotic therapy    Upper GI Bleed Coffee ground emesis noted in ed during cpr -og tube was inserted. -Hb=14.9 down from 21.1 on admission -Given pressors -phenylephrine and norepinephrine   C. [place] -Remove enteric precautions -Discontinue po vancomycin -monitor stool output  ?DVT PE [place]  of lower extremity- no evidence of deep or superficial vein thrombosis in right or left . Incidental baker cyst Ct angio chest finds isolated acute subsegmental pulmonary embolism in right lower lobe, anterior right fifth rib fracture, multiple bilateral posterior rib fractures bilaterally, stable T3 and T11 vertebral compression fractures Continue heparin, consider tpa if further decompensation      . budesonide (PULMICORT) nebulizer solution  0.5 mg Nebulization BID  . chlorhexidine gluconate (MEDLINE KIT)  15 mL Mouth Rinse BID  . Chlorhexidine Gluconate Cloth  6 each Topical Daily  . hydrocortisone sod succinate (SOLU-CORTEF) inj  50 mg Intravenous Q6H  . iopamidol      . ipratropium-albuterol  3 mL Nebulization Q6H  . mouth rinse  15 mL Mouth Rinse QID  . pantoprazole (PROTONIX) IV  40 mg Intravenous Q12H  . sodium bicarbonate      . sodium chloride flush  10-40 mL Intracatheter Q12H  . vancomycin  500 mg Per Tube Q6H    HPI: Mallory Medina is a 51 y.o. female with pmh of copd, dm, hep c, htn, ivdu, and noncompliance with medication (due to financial status) presented with lethargy and cough. The patient was non-communicative, hence history was gathered via chart review and from family. The patient's daughter stated that the patient has been feeling lethargic for the past week now. The patient woke up early last week with upper back pain. She has been having accompanied productive cough with yellow-black colored sputum. She was seen in the ED 8/4 for dyspnea and back pain that started 3 days ago. She was thought to be having aa COPD exacerbation and treated with prednisone, duoneb, proventil, and azithromycin. Patient's daughter stated that the patient used cocaine 3 days ago.  In the ED yesterday (8/7), the patient was saturating at 78% on room air, hypotensive at 97/86, wbc=18.7, HR=135, RR=33.  She became unresponsive and was noted to have a pea arrest for 8 min. The patient was  intubated, a-line and cvc were placed. The patient was noted to have coffee ground emesis during cpr and therefore og tube was inserted.  Review of Systems: ROS none except above   Past Medical History:  Diagnosis Date  . Back pain, chronic   . COPD (chronic obstructive pulmonary disease) ([facility])   . Diabetes mellitus without complication ([place])    Type II  . Hepatitis C   . Hypertension   . IV drug abuse recovery   hx of    Social History  Substance Use Topics  . Smoking status: Current Every Day Smoker    Packs/day: 1.00    Types: Cigarettes  . Smokeless tobacco: Never Used  . Alcohol use No    Family History  Problem Relation Age of Onset  . Diabetes Mellitus II Mother    Allergies  Allergen Reactions  . Sulfa Antibiotics Nausea And Vomiting    Stomach ache    OBJECTIVE: Blood pressure 114/79, pulse (!) 146, temperature (!) 97.5 F (36.4 C), temperature source Oral, resp. rate (!) 30, height 5' 3"  (1.6 m), weight 164 lb 14.5 oz (74.8 kg), SpO2 97 %.  Physical Exam  Constitutional: She appears unhealthy. She has a sickly appearance. She appears  distressed.  HENT:  Head: Normocephalic and atraumatic.  Mouth/Throat: Abnormal dentition. Dental abscesses present.  Cardiovascular: Normal rate, regular rhythm, normal heart sounds and intact distal pulses.   Pulmonary/Chest: She is in respiratory distress.  Abdominal: There is tenderness (to palpation, generalized ).  Musculoskeletal: She exhibits no edema.  Skin: Ecchymosis (large amount on left upper back ) noted. There is erythema (at umbilicus).    Lab Results Lab Results  Component Value Date   WBC 15.6 (H) 10/24/2016   HGB 14.9 10/24/2016   HCT 44.6 10/24/2016   MCV 90.5 10/24/2016   PLT 198 10/24/2016    Lab Results  Component Value Date   CREATININE 2.36 (H) 10/24/2016   BUN 39 (H) 10/24/2016   NA 132 (L) 10/24/2016   K 3.7 10/24/2016   CL 95 (L) 10/24/2016   CO2 15 (L) 10/24/2016    Lab Results    Component Value Date   ALT 28 11/05/2016   AST 86 (H) 10/25/2016   ALKPHOS 69 11/10/2016   BILITOT 2.3 (H) 11/15/2016     Microbiology: Recent Results (from the past 240 hour(s))  Culture, blood (routine x 2)     Status: None (Preliminary result)   Collection Time: 10/24/2016  4:15 PM  Result Value Ref Range Status   Specimen Description BLOOD  Final   Special Requests IN PEDIATRIC BOTTLE Blood Culture adequate volume  Final   Culture  Setup Time   Final    GRAM POSITIVE COCCI IN CLUSTERS IN PEDIATRIC BOTTLE CRITICAL RESULT CALLED TO, READ BACK BY AND VERIFIED WITH: Ailene Rud [id number] MLM    Culture GRAM POSITIVE COCCI  Final   Report Status PENDING  Incomplete  Blood Culture ID Panel (Reflexed)     Status: Abnormal   Collection Time: 10/17/2016  4:15 PM  Result Value Ref Range Status   Enterococcus species NOT DETECTED NOT DETECTED Final   Listeria monocytogenes NOT DETECTED NOT DETECTED Final   Staphylococcus species DETECTED (A) NOT DETECTED Final    Comment: CRITICAL RESULT CALLED TO, READ BACK BY AND VERIFIED WITH: Ellin Mayhew MACCIA [phone] MLM    Staphylococcus aureus DETECTED (A) NOT DETECTED Final    Comment: Methicillin (oxacillin) susceptible Staphylococcus aureus (MSSA). Preferred therapy is anti staphylococcal beta lactam antibiotic (Cefazolin or Nafcillin), unless clinically contraindicated. CRITICAL RESULT CALLED TO, READ BACK BY AND VERIFIED WITH: Ailene Rud [id number] MLM    Methicillin resistance NOT DETECTED NOT DETECTED Final   Streptococcus species NOT DETECTED NOT DETECTED Final   Streptococcus agalactiae NOT DETECTED NOT DETECTED Final   Streptococcus pneumoniae NOT DETECTED NOT DETECTED Final   Streptococcus pyogenes NOT DETECTED NOT DETECTED Final   Acinetobacter baumannii NOT DETECTED NOT DETECTED Final   Enterobacteriaceae species NOT DETECTED NOT DETECTED Final   Enterobacter cloacae complex NOT DETECTED NOT DETECTED Final    Escherichia coli NOT DETECTED NOT DETECTED Final   Klebsiella oxytoca NOT DETECTED NOT DETECTED Final   Klebsiella pneumoniae NOT DETECTED NOT DETECTED Final   Proteus species NOT DETECTED NOT DETECTED Final   Serratia marcescens NOT DETECTED NOT DETECTED Final   Haemophilus influenzae NOT DETECTED NOT DETECTED Final   Neisseria meningitidis NOT DETECTED NOT DETECTED Final   Pseudomonas aeruginosa NOT DETECTED NOT DETECTED Final   Candida albicans NOT DETECTED NOT DETECTED Final   Candida glabrata NOT DETECTED NOT DETECTED Final   Candida krusei NOT DETECTED NOT DETECTED Final   Candida parapsilosis NOT DETECTED NOT DETECTED Final  Candida tropicalis NOT DETECTED NOT DETECTED Final  MRSA PCR Screening     Status: None   Collection Time: 10/25/2016 10:45 PM  Result Value Ref Range Status   MRSA by PCR NEGATIVE NEGATIVE Final    Comment:        The GeneXpert MRSA Assay (FDA approved for NASAL specimens only), is one component of a comprehensive MRSA colonization surveillance program. It is not intended to diagnose MRSA infection nor to guide or monitor treatment for MRSA infections.     Lars Mage, [place] [facility] [place] [phone] pager   [phone] cell 10/24/2016, 10:55 AM

## 2016-10-24 NOTE — Progress Notes (Signed)
**  Preliminary report by tech**  Bilateral lower extremity venous duplex completed. There is no evidence of deep or superficial vein thrombosis involving the right and left lower extremities. All visualized vessels appear patent and compressible.  Incidental findings are consistent with a Baker's Cyst on the right measuring 2.4 cm high by 3.7 cm wide by 4.7 cm long and a Baker's Cyst on the left measuring 3.5 cm high by 4.4 cm wide by 5.9 cm long. Results were given to the patient's nurse, Stark JockIrekia.  10/24/16 9:28 AM Olen CordialGreg Hallie Ertl RVT

## 2016-10-24 NOTE — Progress Notes (Signed)
   10/24/16 1120  Clinical Encounter Type  Visited With Patient and family together  Visit Type Follow-up  Spiritual Encounters  Spiritual Needs Emotional  Stress Factors  Patient Stress Factors Health changes  Family Stress Factors Family relationships  Introduction to family as Pt intubated. [employer] presence. Available as requested.

## 2016-10-24 NOTE — Progress Notes (Addendum)
See downtime chart for 1600 Shift assessment as well as V/S from 1445-1700.

## 2016-10-24 NOTE — Progress Notes (Signed)
UGIB has stopped, DIC has been ruled out, and Hgb stable at 15.7 (down from 21 on admission). However, her shock continues to worsen, now on phenyl @ 500 and levo @ 17, with stress dose steroids and bicarb gtt. Given the worsening shock, I do not think she would tolerate therapeutic hypothermia. Septic shock is a relative contraindication anyway. I am still concerned she may have a PE. TTE performed shows globally decreased LVEF (couldn't give a definite number due to poor quality study); It also showed questionable thrombus within the RV apex and RVSP mildly reduced. Given this, I think we should definitely be anticoagulating her. Will start Heparin gtt. Will not give TPA unless she decompensates further. Since TTE was poor quality study and the RV clot was not definite, I would like to get a CTA Chest. However, she does not have a power port or 20G PIV in her AC to accomodate this. Will ask IV team to try to place an ultrasound-guided PIV.   Additional 35 minutes of critical care time spent with this patient.

## 2016-10-24 NOTE — Progress Notes (Addendum)
ANTICOAGULATION CONSULT NOTE   Pharmacy Consult for Heparin Indication: pulmonary embolus  Allergies  Allergen Reactions  . Sulfa Antibiotics Nausea And Vomiting    Stomach ache    Patient Measurements: Height: 5\' 3"  (160 cm) Weight: 164 lb 14.5 oz (74.8 kg) IBW/kg (Calculated) : 52.4 Heparin Dosing Weight: 70 kg  Vital Signs: Temp: 103.3 F (39.6 C) (08/08 1700) Temp Source: Core (Comment) (08/08 1700) BP: 165/113 (08/08 1700) Pulse Rate: 148 (08/08 1700)  Labs:  Recent Labs  11/07/2016 1513 10/22/2016 1528 11/08/2016 2338 10/24/16 0448 10/24/16 0844 10/24/16 1345 10/24/16 1729  HGB 18.8* 21.1* 15.7* 14.9  --   --  13.7  HCT 53.8* 62.0* 47.2* 44.6  --   --  39.8  PLT 298  --  210 198  --   --  165  LABPROT 23.5*  --   --   --   --   --   --   INR 2.06  --   --   --   --   --   --   HEPARINUNFRC  --   --   --   --  0.12*  --  0.27*  CREATININE 2.42* 2.00* 2.33* 2.36*  --   --   --   CKTOTAL  --   --   --   --   --  84,13213,687*  --     Estimated Creatinine Clearance: 27.3 mL/min (A) (by C-G formula based on SCr of 2.36 mg/dL (H)).  Medical History: Past Medical History:  Diagnosis Date  . Back pain, chronic   . COPD (chronic obstructive pulmonary disease) ([facility])   . 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 Hepatitis C   . Hypertension   . IV drug abuse recovery   hx of    Assessment: 51 y.o. female with MSSA bacteremia and PE with possible RV thrombus. Heparin was recently held due to bloody emesis after CPR and then resumed (restarted this am) due to the concern for PE.  -heparin level is 0.27 and below goal  Goal of Therapy:  Heparin level 0.3-0.7 units/ml Monitor platelets by anticoagulation protocol: Yes   Plan:  -Increase heparin to 1850 units/hr -Heparin level in 8 hours and daily wth CBC daily  Harland GermanAndrew Levon Penning, Pharm D 10/24/2016 7:10 PM

## 2016-10-24 NOTE — Progress Notes (Signed)
eLink Physician-Brief Progress Note Patient Name: Mallory Medina DOB: February 24, 1966 MRN: [id number]   Date of Service  10/24/2016  HPI/Events of Note  Mutiple issues: - ABG on 60%/PRVC 30/TV 420/P 14 = 7.21/50.4/80.0/19.7 - Already on a NaHCO3 IV infusion and breathing over set rate at 38 and 2. Fever to 103.3 F - AST elevated and Creatinine = 2.36. Therfore, reluctant to use Tylenol or Motrin for fever control.  eICU Interventions  Will order: 1. Cooling Blanket. 2. NaHCO3 100 meq IV now.  3. Repeat ABG at 9 PM.      Intervention Category Major Interventions: Acid-Base disturbance - evaluation and management;Infection - evaluation and management  Sommer,Steven Eugene 10/24/2016, 6:11 PM

## 2016-10-24 NOTE — Progress Notes (Signed)
ANTICOAGULATION CONSULT NOTE - Initial Consult  Pharmacy Consult for Heparin Indication: pulmonary embolus  Allergies  Allergen Reactions  . Sulfa Antibiotics Nausea And Vomiting    Stomach ache    Patient Measurements: Height: 5\' 3"  (160 cm) Weight: 175 lb 0.7 oz (79.4 kg) IBW/kg (Calculated) : 52.4 Heparin Dosing Weight: 75 kg  Vital Signs: Temp: 98.2 F (36.8 C) (08/08 0039) Temp Source: Oral (08/08 0039) BP: 113/71 (08/08 0215) Pulse Rate: 159 (08/08 0215)  Labs:  Recent Labs  08/13/2016 1513 08/13/2016 1528 08/13/2016 2338  HGB 18.8* 21.1* 15.7*  HCT 53.8* 62.0* 47.2*  PLT 298  --  210  LABPROT 23.5*  --   --   INR 2.06  --   --   CREATININE 2.42* 2.00* 2.33*    Estimated Creatinine Clearance: 28.5 mL/min (A) (by C-G formula based on SCr of 2.33 mg/dL (H)).   Medical History: Past Medical History:  Diagnosis Date  . Back pain, chronic   . COPD (chronic obstructive pulmonary disease) ([facility])   . 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 Hepatitis C   . Hypertension   . IV drug abuse recovery   hx of    Medications:  Prescriptions Prior to Admission  Medication Sig Dispense Refill Last Dose  . azithromycin (ZITHROMAX) 250 MG tablet Take 1 tablet (250 mg total) by mouth daily. Take 1 every day until finished. 4 tablet 0 10/21/2016 at Unknown time  . diphenhydramine-acetaminophen (TYLENOL PM) 25-500 MG TABS tablet Take 2 tablets by mouth at bedtime as needed (sleep).    10/22/2016 at Unknown time  . METHADONE HCL PO Take 120 mg by mouth daily. liquid   11/07/2016 at Unknown time  . predniSONE (DELTASONE) 20 MG tablet Take 2 tablets (40 mg total) by mouth daily with breakfast. For the next four days 8 tablet 0 10/29/2016 at Unknown time  . albuterol (PROVENTIL HFA;VENTOLIN HFA) 108 (90 BASE) MCG/ACT inhaler Inhale 2 puffs into the lungs every 6 (six) hours as needed for wheezing. (Patient not taking: Reported on 07/07/2016) 2 Inhaler 0 Not Taking  .  albuterol (PROVENTIL) (2.5 MG/3ML) 0.083% nebulizer solution Take 3 mLs (2.5 mg total) by nebulization every 6 (six) hours as needed for wheezing or shortness of breath. (Patient not taking: Reported on 09/15/2016) 75 mL 0 Not Taking at Unknown time  . beclomethasone (QVAR) 40 MCG/ACT inhaler Inhale 2 puffs into the lungs 2 (two) times daily. (Patient not taking: Reported on 03/17/2015) 1 Inhaler 0 Not Taking at Unknown time  . DULoxetine (CYMBALTA) 20 MG capsule Take 1 capsule (20 mg total) by mouth daily. (Patient not taking: Reported on 03/17/2015) 30 capsule 0 Not Taking at Unknown time  . ipratropium (ATROVENT) 0.02 % nebulizer solution Take 2.5 mLs (500 mcg total) by nebulization 4 (four) times daily. (Patient not taking: Reported on 10/20/2015) 75 mL 0 Not Taking at Unknown time  . methocarbamol (ROBAXIN) 500 MG tablet Take 1 tablet (500 mg total) by mouth every 8 (eight) hours as needed for muscle spasms. (Patient not taking: Reported on 07/07/2016) 20 tablet 0 Not Taking at Unknown time  . oxyCODONE-acetaminophen (PERCOCET) 5-325 MG tablet Take 1-2 tablets by mouth every 4 (four) hours as needed. (Patient not taking: Reported on 07/07/2016) 20 tablet 0 Not Taking at Unknown time    Assessment: 51 y.o. female with shock, possible PE, for heparin Goal of Therapy:  Heparin level 0.3-0.7 units/ml Monitor platelets by anticoagulation protocol: Yes  Plan:  Heparin 4000 units IV bolus, then start heparin 1350 units/hr Check heparin level in 6 hours.  Braison Snoke, Gary Fleet 10/24/2016,2:27 AM

## 2016-10-24 NOTE — Progress Notes (Signed)
Accompanied several family members to 26M waiting rm. Provided spiritual/emotional support and prayer -- which they much appreciated.   Returned later to check w/ unit nurse, who was not yet ready getting pt settled into rm. Let family know she would come out and talk w/ them and take them back to see pt as soon as was finished w/ that. Chaplain available for f/u.   2017-01-28 2300  Clinical Encounter Type  Visited With Family;Health care provider  Visit Type Follow-up;Psychological support;Spiritual support;Social support;Code;Critical Care;ED  Referral From Chaplain  Spiritual Encounters  Spiritual Needs Prayer;Emotional;Grief support  Stress Factors  Patient Stress Factors Health changes;Loss of control  Family Stress Factors Family relationships;Health changes;Loss of control   Ephraim Hamburgerynthia A Silvia Hightower, [address]haplain

## 2016-10-25 DIAGNOSIS — R6521 Severe sepsis with septic shock: Secondary | ICD-10-CM

## 2016-10-25 DIAGNOSIS — A419 Sepsis, unspecified organism: Secondary | ICD-10-CM

## 2016-10-25 DIAGNOSIS — N179 Acute kidney failure, unspecified: Secondary | ICD-10-CM

## 2016-10-25 LAB — CBC WITH DIFFERENTIAL/PLATELET
BASOS PCT: 0 %
Band Neutrophils: 0 %
Basophils Absolute: 0 10*3/uL (ref 0.0–0.1)
Basophils Absolute: 0 10*3/uL (ref 0.0–0.1)
Basophils Relative: 0 %
Blasts: 0 %
EOS PCT: 0 %
Eosinophils Absolute: 0 10*3/uL (ref 0.0–0.7)
Eosinophils Absolute: 0 10*3/uL (ref 0.0–0.7)
Eosinophils Relative: 0 %
HEMATOCRIT: 32.7 % — AB (ref 36.0–46.0)
HEMATOCRIT: 29.1 % — AB (ref 36.0–46.0)
HEMOGLOBIN: 11.3 g/dL — AB (ref 12.0–15.0)
Hemoglobin: 9.8 g/dL — ABNORMAL LOW (ref 12.0–15.0)
LYMPHS ABS: 2.3 10*3/uL (ref 0.7–4.0)
LYMPHS PCT: 10 %
LYMPHS PCT: 7 %
Lymphs Abs: 4 10*3/uL (ref 0.7–4.0)
MCH: 31.3 pg (ref 26.0–34.0)
MCH: 29.8 pg (ref 26.0–34.0)
MCHC: 34.6 g/dL (ref 30.0–36.0)
MCHC: 33.7 g/dL (ref 30.0–36.0)
MCV: 90.6 fL (ref 78.0–100.0)
MCV: 88.4 fL (ref 78.0–100.0)
MONO ABS: 1.6 10*3/uL — AB (ref 0.1–1.0)
MONOS PCT: 8 %
MONOS PCT: 5 %
Metamyelocytes Relative: 0 %
Monocytes Absolute: 3.2 10*3/uL — ABNORMAL HIGH (ref 0.1–1.0)
Myelocytes: 0 %
NEUTROS ABS: 33.1 10*3/uL — AB (ref 1.7–7.7)
NEUTROS ABS: 29 10*3/uL — AB (ref 1.7–7.7)
NRBC: 0 /100{WBCs}
Neutrophils Relative %: 82 %
Neutrophils Relative %: 88 %
Platelets: 135 10*3/uL — ABNORMAL LOW (ref 150–400)
Platelets: 95 10*3/uL — ABNORMAL LOW (ref 150–400)
Promyelocytes Absolute: 0 %
RBC: 3.61 MIL/uL — ABNORMAL LOW (ref 3.87–5.11)
RBC: 3.29 MIL/uL — AB (ref 3.87–5.11)
RDW: 14.9 % (ref 11.5–15.5)
RDW: 14.8 % (ref 11.5–15.5)
WBC Morphology: INCREASED
WBC Morphology: INCREASED
WBC: 40.3 10*3/uL — ABNORMAL HIGH (ref 4.0–10.5)
WBC: 32.9 10*3/uL — AB (ref 4.0–10.5)

## 2016-10-25 LAB — BLOOD GAS, ARTERIAL
ACID-BASE EXCESS: 1 mmol/L (ref 0.0–2.0)
Acid-base deficit: 8.3 mmol/L — ABNORMAL HIGH (ref 0.0–2.0)
BICARBONATE: 17.9 mmol/L — AB (ref 20.0–28.0)
BICARBONATE: 25.6 mmol/L (ref 20.0–28.0)
Drawn by: 511331
Drawn by: 511841
FIO2: 70
FIO2: 60
LHR: 30 {breaths}/min
MECHVT: 420 mL
O2 Saturation: 96.3 %
O2 Saturation: 92 %
PATIENT TEMPERATURE: 98.6
PEEP/CPAP: 14 cmH2O
PEEP: 14 cmH2O
PH ART: 7.375 (ref 7.350–7.450)
Patient temperature: 98.6
RATE: 30 resp/min
VT: 420 mL
pCO2 arterial: 44.7 mmHg (ref 32.0–48.0)
pCO2 arterial: 44.8 mmHg (ref 32.0–48.0)
pH, Arterial: 7.228 — ABNORMAL LOW (ref 7.350–7.450)
pO2, Arterial: 96.8 mmHg (ref 83.0–108.0)
pO2, Arterial: 67.1 mmHg — ABNORMAL LOW (ref 83.0–108.0)

## 2016-10-25 LAB — GLUCOSE, CAPILLARY
Glucose-Capillary: 120 mg/dL — ABNORMAL HIGH (ref 65–99)
Glucose-Capillary: 112 mg/dL — ABNORMAL HIGH (ref 65–99)
Glucose-Capillary: 98 mg/dL (ref 65–99)
Glucose-Capillary: 104 mg/dL — ABNORMAL HIGH (ref 65–99)
Glucose-Capillary: 147 mg/dL — ABNORMAL HIGH (ref 65–99)

## 2016-10-25 LAB — POCT I-STAT 3, ART BLOOD GAS (G3+)
Acid-base deficit: 4 mmol/L — ABNORMAL HIGH (ref 0.0–2.0)
Bicarbonate: 24.4 mmol/L (ref 20.0–28.0)
O2 SAT: 100 %
PCO2 ART: 57.5 mmHg — AB (ref 32.0–48.0)
PH ART: 7.235 — AB (ref 7.350–7.450)
PO2 ART: 261 mmHg — AB (ref 83.0–108.0)
Patient temperature: 98
TCO2: 26 mmol/L (ref 0–100)

## 2016-10-25 LAB — COMPREHENSIVE METABOLIC PANEL
ALBUMIN: 1.6 g/dL — AB (ref 3.5–5.0)
ALBUMIN: 2.4 g/dL — AB (ref 3.5–5.0)
ALK PHOS: 88 U/L (ref 38–126)
ALK PHOS: 113 U/L (ref 38–126)
ALT: 257 U/L — AB (ref 14–54)
ALT: 274 U/L — ABNORMAL HIGH (ref 14–54)
AST: 1263 U/L — ABNORMAL HIGH (ref 15–41)
AST: 1643 U/L — ABNORMAL HIGH (ref 15–41)
Anion gap: 26 — ABNORMAL HIGH (ref 5–15)
Anion gap: 22 — ABNORMAL HIGH (ref 5–15)
BILIRUBIN TOTAL: 1.9 mg/dL — AB (ref 0.3–1.2)
BILIRUBIN TOTAL: 3.7 mg/dL — AB (ref 0.3–1.2)
BUN: 41 mg/dL — ABNORMAL HIGH (ref 6–20)
BUN: 41 mg/dL — ABNORMAL HIGH (ref 6–20)
CALCIUM: 5.5 mg/dL — AB (ref 8.9–10.3)
CALCIUM: 6 mg/dL — AB (ref 8.9–10.3)
CO2: 22 mmol/L (ref 22–32)
CO2: 24 mmol/L (ref 22–32)
CREATININE: 3.44 mg/dL — AB (ref 0.44–1.00)
Chloride: 83 mmol/L — ABNORMAL LOW (ref 101–111)
Chloride: 82 mmol/L — ABNORMAL LOW (ref 101–111)
Creatinine, Ser: 3.75 mg/dL — ABNORMAL HIGH (ref 0.44–1.00)
GFR calc Af Amer: 15 mL/min — ABNORMAL LOW (ref >60)
GFR calc non Af Amer: 14 mL/min — ABNORMAL LOW (ref >60)
GFR, EST AFRICAN AMERICAN: 17 mL/min — AB (ref >60)
GFR, EST NON AFRICAN AMERICAN: 13 mL/min — AB (ref >60)
GLUCOSE: 194 mg/dL — AB (ref 65–99)
GLUCOSE: 112 mg/dL — AB (ref 65–99)
POTASSIUM: 4.6 mmol/L (ref 3.5–5.1)
Potassium: 4.2 mmol/L (ref 3.5–5.1)
SODIUM: 131 mmol/L — AB (ref 135–145)
Sodium: 128 mmol/L — ABNORMAL LOW (ref 135–145)
TOTAL PROTEIN: 4.3 g/dL — AB (ref 6.5–8.1)
Total Protein: 3.4 g/dL — ABNORMAL LOW (ref 6.5–8.1)

## 2016-10-25 LAB — URINE CULTURE: CULTURE: NO GROWTH

## 2016-10-25 LAB — PROTIME-INR
INR: >10
INR: >10

## 2016-10-25 LAB — HEPARIN LEVEL (UNFRACTIONATED)
HEPARIN UNFRACTIONATED: 0.18 [IU]/mL — AB (ref 0.30–0.70)
HEPARIN UNFRACTIONATED: 0.19 [IU]/mL — AB (ref 0.30–0.70)

## 2016-10-25 LAB — LACTIC ACID, PLASMA: LACTIC ACID, VENOUS: 14.1 mmol/L — AB (ref 0.5–1.9)

## 2016-10-25 LAB — PROCALCITONIN: Procalcitonin: 56.15 ng/mL

## 2016-10-25 LAB — FIBRINOGEN: Fibrinogen: 375 mg/dL (ref 210–475)

## 2016-10-25 LAB — MAGNESIUM: Magnesium: 1.9 mg/dL (ref 1.7–2.4)

## 2016-10-25 LAB — PHOSPHORUS: Phosphorus: 8.4 mg/dL — ABNORMAL HIGH (ref 2.5–4.6)

## 2016-10-25 MED ORDER — SODIUM CHLORIDE 0.9 % IV SOLN
1.2500 ng/kg/min | INTRAVENOUS | Status: DC
  Administered 2016-10-25: 5 ng/kg/min via INTRAVENOUS
  Filled 2016-10-25: qty 1

## 2016-10-25 MED ORDER — SODIUM CHLORIDE 0.9 % IV BOLUS (SEPSIS)
1000.0000 mL | Freq: Once | INTRAVENOUS | Status: AC
  Administered 2016-10-25: 1000 mL via INTRAVENOUS

## 2016-10-25 MED ORDER — FLUCONAZOLE IN SODIUM CHLORIDE 400-0.9 MG/200ML-% IV SOLN
400.0000 mg | Freq: Once | INTRAVENOUS | Status: AC
  Administered 2016-10-25: 400 mg via INTRAVENOUS
  Filled 2016-10-25: qty 200

## 2016-10-25 MED ORDER — SODIUM CHLORIDE 0.9 % IV SOLN
100.0000 mg | INTRAVENOUS | Status: DC
  Administered 2016-10-26 – 2016-10-27 (×2): 100 mg via INTRAVENOUS
  Filled 2016-10-25 (×3): qty 100

## 2016-10-25 MED ORDER — HYDROCORTISONE NA SUCCINATE PF 100 MG IJ SOLR
100.0000 mg | Freq: Once | INTRAMUSCULAR | Status: AC
  Administered 2016-10-25: 100 mg via INTRAVENOUS
  Filled 2016-10-25: qty 2

## 2016-10-25 MED ORDER — FENTANYL BOLUS VIA INFUSION
25.0000 ug | INTRAVENOUS | Status: DC | PRN
  Filled 2016-10-25: qty 50

## 2016-10-25 MED ORDER — HEPARIN BOLUS VIA INFUSION
2000.0000 [IU] | Freq: Once | INTRAVENOUS | Status: AC
  Administered 2016-10-25: 2000 [IU] via INTRAVENOUS
  Filled 2016-10-25: qty 2000

## 2016-10-25 MED ORDER — SODIUM CHLORIDE 0.9 % IV SOLN
200.0000 mg | Freq: Once | INTRAVENOUS | Status: AC
  Administered 2016-10-25: 200 mg via INTRAVENOUS
  Filled 2016-10-25: qty 200

## 2016-10-25 MED ORDER — BUDESONIDE 0.5 MG/2ML IN SUSP
0.5000 mg | Freq: Two times a day (BID) | RESPIRATORY_TRACT | Status: DC
  Administered 2016-10-25 – 2016-10-27 (×5): 0.5 mg via RESPIRATORY_TRACT
  Filled 2016-10-25 (×6): qty 2

## 2016-10-25 MED ORDER — ALBUMIN HUMAN 25 % IV SOLN
25.0000 g | INTRAVENOUS | Status: DC
  Administered 2016-10-25 (×3): 25 g via INTRAVENOUS
  Filled 2016-10-25: qty 50
  Filled 2016-10-25 (×2): qty 100

## 2016-10-25 MED ORDER — FLUCONAZOLE IN SODIUM CHLORIDE 200-0.9 MG/100ML-% IV SOLN: 200.0000 mg | INTRAVENOUS | Status: DC

## 2016-10-25 MED ORDER — ACETAMINOPHEN 325 MG PO TABS
650.0000 mg | ORAL_TABLET | Freq: Four times a day (QID) | ORAL | Status: DC | PRN
Start: 2016-10-25 — End: 2016-10-25
  Administered 2016-10-25: 650 mg via ORAL
  Filled 2016-10-25: qty 2

## 2016-10-25 MED ORDER — SODIUM CHLORIDE 0.9 % IV SOLN
2.0000 g | Freq: Once | INTRAVENOUS | Status: AC
  Administered 2016-10-25: 2 g via INTRAVENOUS
  Filled 2016-10-25: qty 20

## 2016-10-25 MED ORDER — SODIUM CHLORIDE 0.9 % IV SOLN
1.2500 ng/kg/min | INTRAVENOUS | Status: DC
  Administered 2016-10-25 – 2016-10-26 (×3): 40 ng/kg/min via INTRAVENOUS
  Filled 2016-10-25 (×4): qty 1

## 2016-10-25 NOTE — Progress Notes (Signed)
[facility]  Date of Admission:  11/15/2016   Total days of antibiotics 3        Day 3 Vancomycin        Day 3 Zosyn        Day 1 anidulafungin        Day 1 fluconazole  ASSESSMENT and PLAN:  MSSA Bacteremia Sepsis Patient biofire result is positive for MSSA in the blood. 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 Blood cultures to date do not show any yeast growth. There is low suspicion for fungemia/candidemia in the setting of mssa bacteremia and the GI bleed is likely the cause of her abdominal pain. She is not on TPN which is a large risk factor for fungemia.  -Appreciate pharmacy assistance with dosing and monitoring trough -Urine culture no growth 8/9 -Consider TEE as TTE quality poor and showed possible thrombus in RV -CT angio 8/8 shows a subsegmental pulmonary embolus which is can possibly be septic emboli? -Procalcitonin is elevated at 56 from previous 37.6 -Lactic acid=14.1, continues to rise in the setting of sepsis     . budesonide (PULMICORT) nebulizer solution  0.5 mg Nebulization BID  . chlorhexidine gluconate (MEDLINE KIT)  15 mL Mouth Rinse BID  . Chlorhexidine Gluconate Cloth  6 each Topical Daily  . ipratropium-albuterol  3 mL Nebulization Q6H  . mouth rinse  15 mL Mouth Rinse QID  . pantoprazole (PROTONIX) IV  40 mg Intravenous Q12H  . sodium chloride flush  10-40 mL Intracatheter Q12H    SUBJECTIVE: Mallory Medina was seen laying in her icu bed. She was sedated and not responsive to commands or questions. Per  nursing report the patient still grimaces when abdomen is palpated.    Review of Systems: ROS as above   Allergies  Allergen Reactions  . Sulfa Antibiotics Nausea And Vomiting    Stomach ache    OBJECTIVE: Vitals:   10/25/16 0645 10/25/16 0700 10/25/16 0743 10/25/16 0800  BP:  (!) 93/53 (!) 106/59 (!) 91/54  Pulse: (!) 137 (!) 134 (!) 131 (!) 142  Resp:   (!) 34 (!) 35  Temp: 99.5 F (37.5 C) 99.5 F (37.5 C)  99.7 F (37.6 C)  TempSrc:      SpO2: 95% 94% 95% 94%  Weight:      Height:       Body mass index is 36.08 kg/m.  Physical Exam  Constitutional: She appears unhealthy. She has a sickly appearance. She appears distressed.  HENT:  Head: Normocephalic and atraumatic.  Cardiovascular: Normal rate and regular rhythm.   Pulmonary/Chest: She has rales.  Abdominal: She exhibits distension. There is tenderness.  Neurological:  Patient is sedated   Skin: Ecchymosis noted. There is erythema.    Lab Results Lab Results  Component Value Date   WBC 40.3 (H) 10/25/2016   HGB 11.3 (L) 10/25/2016   HCT 32.7 (L) 10/25/2016   MCV 90.6 10/25/2016   PLT 135 (L) 10/25/2016    Lab Results  Component Value Date   CREATININE 3.44 (H) 10/25/2016   BUN 41 (H) 10/25/2016  NA 131 (L) 10/25/2016   K 4.2 10/25/2016   CL 83 (L) 10/25/2016   CO2 22 10/25/2016    Lab Results  Component Value Date   ALT 257 (H) 10/25/2016   AST 1,263 (H) 10/25/2016   ALKPHOS 88 10/25/2016   BILITOT 1.9 (H) 10/25/2016     Microbiology: Recent Results (from the past 240 hour(s))  Culture, blood (routine x 2)     Status: None (Preliminary result)   Collection Time: 11/03/2016  4:15 PM  Result Value Ref Range Status   Specimen Description BLOOD  Final   Special Requests IN PEDIATRIC BOTTLE Blood Culture adequate volume  Final   Culture  Setup Time   Final    GRAM POSITIVE COCCI IN CLUSTERS IN PEDIATRIC BOTTLE CRITICAL RESULT CALLED TO, READ BACK BY AND VERIFIED WITH: Ailene Rud [id number]  [phone] MLM    Culture GRAM POSITIVE COCCI  Final   Report Status PENDING  Incomplete  Blood Culture ID Panel (Reflexed)     Status: Abnormal   Collection Time: 10/30/2016  4:15 PM  Result Value Ref Range Status   Enterococcus species NOT DETECTED NOT DETECTED Final   Listeria monocytogenes NOT DETECTED NOT DETECTED Final   Staphylococcus species DETECTED (A) NOT DETECTED Final    Comment: CRITICAL RESULT CALLED TO, READ BACK BY AND VERIFIED WITH: Ellin Mayhew [place] [id number] MLM    Staphylococcus aureus DETECTED (A) NOT DETECTED Final    Comment: Methicillin (oxacillin) susceptible Staphylococcus aureus (MSSA). Preferred therapy is anti staphylococcal beta lactam antibiotic (Cefazolin or Nafcillin), unless clinically contraindicated. CRITICAL RESULT CALLED TO, READ BACK BY AND VERIFIED WITH: Ailene Rud [phone] MLM    Methicillin resistance NOT DETECTED NOT DETECTED Final   Streptococcus species NOT DETECTED NOT DETECTED Final   Streptococcus agalactiae NOT DETECTED NOT DETECTED Final   Streptococcus pneumoniae NOT DETECTED NOT DETECTED Final   Streptococcus pyogenes NOT DETECTED NOT DETECTED Final   Acinetobacter baumannii NOT DETECTED NOT DETECTED Final   Enterobacteriaceae species NOT DETECTED NOT DETECTED Final   Enterobacter cloacae complex NOT DETECTED NOT DETECTED Final   Escherichia coli NOT DETECTED NOT DETECTED Final   Klebsiella oxytoca NOT DETECTED NOT DETECTED Final   Klebsiella pneumoniae NOT DETECTED NOT DETECTED Final   Proteus species NOT DETECTED NOT DETECTED Final   Serratia marcescens NOT DETECTED NOT DETECTED Final   Haemophilus influenzae NOT DETECTED NOT DETECTED Final   Neisseria meningitidis NOT DETECTED NOT DETECTED Final   Pseudomonas aeruginosa NOT DETECTED NOT DETECTED Final   Candida albicans NOT DETECTED NOT DETECTED Final   Candida glabrata NOT DETECTED NOT DETECTED Final   Candida krusei NOT DETECTED NOT DETECTED Final   Candida parapsilosis  NOT DETECTED NOT DETECTED Final   Candida tropicalis NOT DETECTED NOT DETECTED Final  Urine culture     Status: None   Collection Time: 11/11/2016  5:07 PM  Result Value Ref Range Status   Specimen Description URINE, CATHETERIZED  Final   Special Requests NONE  Final   Culture NO GROWTH  Final   Report Status 10/25/2016 FINAL  Final  MRSA PCR Screening     Status: None   Collection Time: 11/04/2016 10:45 PM  Result Value Ref Range Status   MRSA by PCR NEGATIVE NEGATIVE Final    Comment:        The GeneXpert MRSA Assay (FDA approved for NASAL specimens only), is one component of a comprehensive MRSA colonization surveillance program. It is not intended to  diagnose MRSA infection nor to guide or monitor treatment for MRSA infections.   Culture, blood (routine x 2)     Status: None (Preliminary result)   Collection Time: 11/04/2016 11:10 PM  Result Value Ref Range Status   Specimen Description BLOOD LEFT HAND  Final   Special Requests IN PEDIATRIC BOTTLE Blood Culture adequate volume  Final   Culture  Setup Time   Final    GRAM POSITIVE COCCI IN CLUSTERS IN PEDIATRIC BOTTLE CRITICAL VALUE NOTED.  VALUE IS CONSISTENT WITH PREVIOUSLY REPORTED AND CALLED VALUE.    Culture GRAM POSITIVE COCCI  Final   Report Status PENDING  Incomplete  Culture, respiratory (NON-Expectorated)     Status: None (Preliminary result)   Collection Time: 10/24/16  3:23 PM  Result Value Ref Range Status   Specimen Description TRACHEAL ASPIRATE  Final   Special Requests NONE  Final   Gram Stain   Final    RARE WBC PRESENT,BOTH PMN AND MONONUCLEAR MODERATE YEAST    Culture PENDING  Incomplete   Report Status PENDING  Incomplete  Fungus culture, blood     Status: None (Preliminary result)   Collection Time: 10/25/16  4:16 AM  Result Value Ref Range Status   Specimen Description BLOOD RIGHT FOOT  Final   Special Requests   Final    BOTTLES DRAWN AEROBIC ONLY Blood Culture adequate volume   Culture PENDING   Incomplete   Report Status PENDING  Incomplete    Lars Mage, MD [facility] [phone] pager   [phone] cell 10/25/2016, 9:38 AM

## 2016-10-25 NOTE — Progress Notes (Signed)
Link Physician-Brief Progress Note Patient Name: Mallory Medina Back DOB: 01-Jan-1966 MRN: [id number]   Date of Service  10/25/2016  HPI/Events of Note  Difficult situation - Possible PE and now bleeding from Femoral Aline with significant flank ecchymosis and drop in Hgb = 11.3 --> 9.8. Discussed with Dr. Jamison NeighborNestor who agrees with my plan to stop Heparin iV infusion   eICU Interventions  Will D/C Heparin IV infusion.      Intervention Category Major Interventions: Hemorrhage - evaluation and management  [name] 10/25/2016, 6:05 PM

## 2016-10-25 NOTE — Progress Notes (Signed)
Pharmacy Antibiotic Note  Mallory Medina is a 51 y.o. female admitted on 11/05/2016 with abdominal pain/sepsis.  Pharmacy has been consulted for Fluconazole dosing.  Plan: Fluconazole 400 mg IV now, then 200 mg IV q24h  Height: 5\' 3"  (160 cm) Weight: 164 lb 14.5 oz (74.8 kg) IBW/kg (Calculated) : 52.4  Temp (24hrs), Avg:102.2 F (39 C), Min:97.3 F (36.3 C), Max:103.3 F (39.6 C)   Recent Labs Lab 10/20/16 1047 11/01/2016 1513 11/15/2016 1528 10/22/2016 2338 10/24/16 0448 10/24/16 0844 10/24/16 1345 10/24/16 1729 10/24/16 2116 10/25/16 0127  WBC 18.0* 18.7*  --  14.9* 15.6*  --   --  32.2*  --  40.3*  CREATININE 0.96 2.42* 2.00* 2.33* 2.36*  --   --   --   --   --   LATICACIDVEN  --   --  16.58* 10.4*  --  9.8* 9.3*  --  13.5*  --     Estimated Creatinine Clearance: 27.3 mL/min (A) (by C-G formula based on SCr of 2.36 mg/dL (H)).    Allergies  Allergen Reactions  . Sulfa Antibiotics Nausea And Vomiting    Stomach ache    Mallory Medina, Mallory Medina 10/25/2016 2:40 AM

## 2016-10-25 NOTE — Care Management Note (Addendum)
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  Pt is on the ventilator requiring 60-70% FIO2, pt is also on sedation, pressors, bicarb and heparin drips.  CM spoke with both attending and pharmacy and was informed that pt will no longer discharge home on Lovenox and requested consult be cancelled.  CSW consulted for substance abuse.  CM will continue to follow for discharge needs   Expected Discharge Date:                  Expected Discharge Plan:     In-House Referral:  Clinical Social Work  Discharge planning Services  CM Consult  Post Acute Care Choice:    Choice offered to:     DME Arranged:    DME Agency:     HH Arranged:    HH Agency:     Status of Service:     If discussed at [employer] Meetings, dates discussed:    Additional Comments:  Cherylann ParrClaxton, Syan Cullimore S, RN 10/25/2016, 10:25 AM

## 2016-10-25 NOTE — Procedures (Signed)
Arterial Catheter Insertion Procedure Note Mallory BoltJatina Medina [name] [id number] 1966/01/02  Procedure: Insertion of Arterial Catheter  Indications: Blood pressure monitoring and Frequent blood sampling  Procedure Details Consent: Unable to obtain consent because of emergent medical necessity. Time Out: Verified patient identification, verified procedure, site/side was marked, verified correct patient position, special equipment/implants available, medications/allergies/relevent history reviewed, required imaging and test results available.  Performed  Maximum sterile technique was used including antiseptics, cap, gloves, gown, hand hygiene, mask and sheet. Skin prep: Chlorhexidine; local anesthetic administered 20 gauge catheter was inserted into left femoral artery using the Seldinger technique.  Evaluation Blood flow good; BP tracing good. Complications: No apparent complications.  Mallory Medina, AGACNP-BC [facility]  Pgr: [phone]  PCCM Pgr: [phone]

## 2016-10-25 NOTE — Progress Notes (Signed)
CRITICAL VALUE ALERT  Critical Value:  Calcium 6.0  Date & Time Notied:  1625 10/25/16  Provider Notified: Pola CornELink  Orders Received/Actions taken: calcium gluconate IVPB

## 2016-10-25 NOTE — Progress Notes (Signed)
CSW was consulted for substance abuse resources. CSW provided pt with a list of outpatient as well as residential substance abuse facilities. Pt is currently intubated and unable to speak with CSW. CSW notified RN of the list being left in pt's room for family and pt to review when ready.    Claude MangesKierra S. Maayan Jenning, MSW, LCSW-A Emergency Department Clinical Social Worker [phone]

## 2016-10-25 NOTE — Progress Notes (Signed)
eLink Physician-Brief Progress Note Patient Name: Mallory BoltJatina W Gaetano DOB: Aug 26, 1965 MRN: [id number]   Date of Service  10/25/2016  HPI/Events of Note  Ca++ = 6.0.  ICU Interventions  Will replace Ca++     Intervention Category Major Interventions: Electrolyte abnormality - evaluation and management  Philippa Vessey Eugene 10/25/2016, 4:29 PM

## 2016-10-25 NOTE — Progress Notes (Signed)
ANTICOAGULATION CONSULT NOTE   Pharmacy Consult for Heparin Indication: pulmonary embolus  Allergies  Allergen Reactions  . Sulfa Antibiotics Nausea And Vomiting    Stomach ache    Patient Measurements: Height: 5\' 3"  (160 cm) Weight: 203 lb 11.3 oz (92.4 kg) IBW/kg (Calculated) : 52.4 Heparin Dosing Weight: 70 kg  Vital Signs: Temp: 99.7 F (37.6 C) (08/09 0800) BP: 91/54 (08/09 0800) Pulse Rate: 142 (08/09 0800)  Labs:  Recent Labs  11/10/2016 1513  10/21/2016 2338 10/24/16 0448 10/24/16 0844 10/24/16 1345 10/24/16 1729 10/25/16 0127 10/25/16 0430 10/25/16 0730  HGB 18.8*  < > 15.7* 14.9  --   --  13.7 11.3*  --   --   HCT 53.8*  < > 47.2* 44.6  --   --  39.8 32.7*  --   --   PLT 298  --  210 198  --   --  165 135*  --   --   LABPROT 23.5*  --   --   --   --   --   --   --  >90.0*  --   INR 2.06  --   --   --   --   --   --   --  >10.00*  --   HEPARINUNFRC  --   --   --   --  0.12*  --  0.27*  --   --  0.18*  CREATININE 2.42*  < > 2.33* 2.36*  --   --   --  3.44*  --   --   CKTOTAL  --   --   --   --   --  40,98113,[id number]*  --   --   --   --   < > = values in this interval not displayed.  Estimated Creatinine Clearance: 20.9 mL/min (A) (by C-G formula based on SCr of 3.44 mg/dL (H)).  Medical History: Past Medical History:  Diagnosis Date  . Back pain, chronic   . COPD (chronic obstructive pulmonary disease) (HCC)   . 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 Hepatitis C   . Hypertension   . IV drug abuse recovery   hx of    Assessment: 51 y.o. female with MSSA bacteremia and PE with possible RV thrombus. Heparin level was subtherapeutic this morning. No issues noted overnight with heparin drip and no signs or symptoms of bleeding but but nurse noted that the patient does have some bruising on her right flank and upper neck, physician also aware. Patient also in acute liver failure with INR greater than 10.  Discussed patient with Dr. Jamison NeighborNestor during  rounds today and pharmacy to continue dosing heparin at this time.   Goal of Therapy:  Heparin level 0.3-0.7 units/ml Monitor platelets by anticoagulation protocol: Yes   Plan:  -Bolus 2000 units  -Increase heparin to 2250 units/ hour  -Heparin level in 8 hours and daily with CBC daily  Blake DivineShannon Zayven Powe, Pharm.D. PGY1 Pharmacy Resident 10/25/2016 8:49 AM Main Pharmacy: [phone]

## 2016-10-25 NOTE — Progress Notes (Signed)
CVP likely not accurate in setting of high PEEP required on vent. Performed straight leg raise which was positive. Will give some Albumin IV.

## 2016-10-25 NOTE — Progress Notes (Addendum)
      INFECTIOUS DISEASE ATTENDING ADDENDUM:   Date: 10/25/2016  Patient name: Mallory BoltJatina W Kersten  Medical record number: [id number]  Date of birth: 07/26/1965   MSSA IS NEVER CONSIDERED A CONTAMINANT in blood cultures by us in ID  And PS it is GROWING IN BOTH BLOOD CULTURES DRAWN on the 7th drawn several hours apart  Acey LavCornelius Van Dam 10/25/2016, 8:27 AM

## 2016-10-25 NOTE — Progress Notes (Signed)
Pharmacy Antibiotic Note  Mallory BoltJatina W Medina is a 51 y.o. female admitted on 10/22/2016 with sepsis. Pharmacy consulted for eraxis dosing. Patient was started on fluconazole overnight and this has been discontinued. Patient is also currently on vancomycin and zosyn, which pharmacy is following. Blood cultures show MSSA 2/2 bottles, continuing empiric coverage at this time due to increasing WBC count and worsening fever.  Will continue to monitor clinically and follow cultures.   Plan:  Eraxis 200mg  IV x 1 dose  Eraxis 100mg  IV q24 hr  Continue to monitor clinical progress and follow pending cultures   Height: 5\' 3"  (160 cm) Weight: 203 lb 11.3 oz (92.4 kg) IBW/kg (Calculated) : 52.4  Temp (24hrs), Avg:101.5 F (38.6 C), Min:99.1 F (37.3 C), Max:103.3 F (39.6 C)   Recent Labs Lab 11/12/2016 1513  10/20/2016 1528 10/25/2016 2338 10/24/16 0448 10/24/16 0844 10/24/16 1345 10/24/16 1729 10/24/16 2116 10/25/16 0117 10/25/16 0127  WBC 18.7*  --   --  14.9* 15.6*  --   --  32.2*  --   --  40.3*  CREATININE 2.42*  --  2.00* 2.33* 2.36*  --   --   --   --   --  3.44*  LATICACIDVEN  --   < > 16.58* 10.4*  --  9.8* 9.3*  --  13.5* 14.1*  --   < > = values in this interval not displayed.  Estimated Creatinine Clearance: 20.9 mL/min (A) (by C-G formula based on SCr of 3.44 mg/dL (H)).    Allergies  Allergen Reactions  . Sulfa Antibiotics Nausea And Vomiting    Stomach ache    Antimicrobials this admission:  Vanc 8/7>> Zosyn 8/7>> Vanc PO 8/7>>8/8 Cefazolin 8/8>8/8     Microbiology results: 08/07 Blood: MSSA   08/07 Urine: pending  08/07: MRSA PCR: neg 08/07 C. Diff: pending 08/07 GI panel: pending  Thank you for allowing pharmacy to be a part of this patient's care.  Mallory DivineShannon Stacye Medina, Pharm.D. PGY1 Pharmacy Resident 10/25/2016 8:53 AM Main Pharmacy: [phone]

## 2016-10-25 NOTE — Progress Notes (Signed)
Name: Mallory Medina MRN: [id number] DOB: 26-Feb-1966    ADMISSION DATE:  21-Nov-2016 CONSULTATION DATE:  11-21-2016  REFERRING MD :  Dr. Madilyn Hook   CHIEF COMPLAINT:  Hypotension/Sepsis   HISTORY OF PRESENT ILLNESS:  51 y.o. female current smoker with PMH of Chronic Pain Back, COPD, DM, Hepatitis C, HTN, IV drug abuse, non-compliant with medications due to financial restraints. Presented to ED on 8/7 with lethargy and hypotension. Upon EMS arrival oxygen saturation 78% on room air. Upon arrival to ED BP 97/86, WBC 18.7, LA 16.58, ABG 7.1/30.1/241. Given 3L NS. Mental Status began to improve. Patient stated that she has a chronic productive cough with black to yellow sputum, chronic dyspnea, several days of burning will urinating, and generalized pain/fatiuge. Has been bed bound for the last 3 days, reports N/V/D during this time as well. PCCM asked to admit. Presented to ED on 8/4 with upper back pain with dyspnea and a productive cough for 3 says. CXR not revealing of PNA, discharged with bronchodilators, steroids, and azithromycin.  SUBJECTIVE: Worsening hypotension overnight. Patient was given Diflucan.   REVIEW OF SYSTEMS:  Unable to obtain given altered mentation & intubation.   VITAL SIGNS: Temp:  [97.5 F (36.4 C)-103.3 F (39.6 C)] 99.7 F (37.6 C) (08/09 0800) Pulse Rate:  [99-164] 142 (08/09 0800) Resp:  [15-41] 35 (08/09 0800) BP: (75-182)/(11-150) 91/54 (08/09 0800) SpO2:  [74 %-100 %] 94 % (08/09 0800) Arterial Line BP: (63-137)/(45-81) 104/58 (08/09 0800) FiO2 (%):  [60 %-100 %] 70 % (08/09 0800) Weight:  [203 lb 11.3 oz (92.4 kg)] 203 lb 11.3 oz (92.4 kg) (08/09 0439)  PHYSICAL EXAMINATION: General:  No distress. Sedated.  Integument:  Warm. Dry. Increased bruising over right flank & upper back.  HEENT:  Endotracheal tube in place. No scleral icterus. Moist membranes.  Cardiovascular:  Regular rhythm. No JVD appreciated.  Pulmonary:  Distant breath sounds. Symmetric chest  wall rise on ventilator. Abdomen: Soft. Hypoactive bowel sounds. Protuberant. Neurological:  Sedated. Not moving spontaneously. Pupils symmetric.    Recent Labs Lab 11/21/16 2338 10/24/16 0448 10/25/16 0127  NA 130* 132* 131*  K 4.0 3.7 4.2  CL 97* 95* 83*  CO2 16* 15* 22  BUN 38* 39* 41*  CREATININE 2.33* 2.36* 3.44*  GLUCOSE 48* 152* 194*    Recent Labs Lab 10/24/16 0448 10/24/16 1729 10/25/16 0127  HGB 14.9 13.7 11.3*  HCT 44.6 39.8 32.7*  WBC 15.6* 32.2* 40.3*  PLT 198 165 135*   Ct Abdomen Pelvis Wo Contrast  Result Date: 21-Nov-2016 CLINICAL DATA:  51 y/o  F; lactic acidosis and admitted for sepsis. EXAM: CT CHEST, ABDOMEN AND PELVIS WITHOUT CONTRAST TECHNIQUE: Multidetector CT imaging of the chest, abdomen and pelvis was performed following the standard protocol without IV contrast. COMPARISON:  11/27/2012 CT of the chest. 10/20/2015 chest radiograph. FINDINGS: CT CHEST FINDINGS Cardiovascular: No significant vascular findings. Normal heart size. No pericardial effusion. Mild calcific atherosclerosis of the thoracic aorta. Mediastinum/Nodes: Mildly patulous and fluid-filled esophagus. No adenopathy. Normal thyroid gland. Lungs/Pleura: No consolidation or pneumothorax. Patent central airways. Trace right pleural effusion. Musculoskeletal: Numerous bilateral posterior rib fractures, several healed with callus formation, several with productive changes and nonunion. No acute bone fracture identified. Edema within subcutaneous fat of the chest wall asymmetrically on the right and posteriorly. CT ABDOMEN PELVIS FINDINGS Hepatobiliary: No focal liver abnormality is seen. No gallstones, gallbladder wall thickening, or biliary dilatation. Pancreas: Unremarkable. No pancreatic ductal dilatation or surrounding inflammatory changes. Spleen: The  spleen measures 9.5 x 5.5 x 15.7 cm (volume = 430 cm^3)(AP x ML x CC series 3, image 51 and series 7, image 71) . Adrenals/Urinary Tract: Adrenal  glands are unremarkable. Kidneys are normal, without renal calculi, focal lesion, or hydronephrosis. Bladder is unremarkable. Stomach/Bowel: Stomach is within normal limits. Appendix not identified. No bowel wall thickening or inflammatory changes. Mild nonspecific distention of the transverse colon. Vascular/Lymphatic: Aortic atherosclerosis with moderate calcific atherosclerosis. No enlarged abdominal or pelvic lymph nodes. Reproductive: Uterus and bilateral adnexa are unremarkable. Other: No abdominal wall hernia or abnormality. No abdominopelvic ascites. Musculoskeletal: L4-S1 posterior instrumented fusion, lateral mass fusion, and laminectomy. No periprosthetic fracture or lucency identified. Stable severe compression deformity of T11 vertebral body with 6 mm retropulsion of superior endplate. Stable chronic superior endplate fracture of T3 vertebral body with mild loss of height. Edema within the subcutaneous fat of the abdominal wall asymmetrically to the right and posteriorly. IMPRESSION: 1. Suboptimal assessment of soft tissues without intravenous contrast. 2. Trace right pleural effusion.  No consolidation. 3. Mildly patulous and fluid-filled esophagus. 4. Splenomegaly, volume of 430 cc. 5. Mild nonspecific distention of the transverse colon without appreciable inflammatory change. 6. Mild thoracic and moderate abdominal aortic calcific atherosclerosis. 7. Stable severe T11 compression deformity with 6 mm retropulsion superior endplate mild T3 compression deformity. 8. Multiple chronic rib fracture deformities. 9. Edema in subcutaneous fat of right lateral and posterior thoracoabdominal walls, probably third-spacing with asymmetry due to patient positioning. Electronically Signed   By: Mitzi Hansen M.D.   On: 2016/10/28 17:32   Ct Head Wo Contrast  Result Date: 28-Oct-2016 CLINICAL DATA:  Encephalopathy. Unresponsive. Status post resuscitation. EXAM: CT HEAD WITHOUT CONTRAST TECHNIQUE:  Contiguous axial images were obtained from the base of the skull through the vertex without intravenous contrast. COMPARISON:  10/20/2015 FINDINGS: Brain: No evidence of acute infarction, hemorrhage, hydrocephalus, extra-axial collection or mass lesion/mass effect. Vascular: No hyperdense vessel or unexpected calcification. Skull: Normal. Negative for fracture or focal lesion. Sinuses/Orbits: No acute finding. Other: None. IMPRESSION: No acute findings by head CT. Electronically Signed   By: Irish Lack M.D.   On: Oct 28, 2016 22:49   Ct Chest Wo Contrast  Result Date: 28-Oct-2016 CLINICAL DATA:  51 y/o  F; lactic acidosis and admitted for sepsis. EXAM: CT CHEST, ABDOMEN AND PELVIS WITHOUT CONTRAST TECHNIQUE: Multidetector CT imaging of the chest, abdomen and pelvis was performed following the standard protocol without IV contrast. COMPARISON:  11/27/2012 CT of the chest. 10/20/2015 chest radiograph. FINDINGS: CT CHEST FINDINGS Cardiovascular: No significant vascular findings. Normal heart size. No pericardial effusion. Mild calcific atherosclerosis of the thoracic aorta. Mediastinum/Nodes: Mildly patulous and fluid-filled esophagus. No adenopathy. Normal thyroid gland. Lungs/Pleura: No consolidation or pneumothorax. Patent central airways. Trace right pleural effusion. Musculoskeletal: Numerous bilateral posterior rib fractures, several healed with callus formation, several with productive changes and nonunion. No acute bone fracture identified. Edema within subcutaneous fat of the chest wall asymmetrically on the right and posteriorly. CT ABDOMEN PELVIS FINDINGS Hepatobiliary: No focal liver abnormality is seen. No gallstones, gallbladder wall thickening, or biliary dilatation. Pancreas: Unremarkable. No pancreatic ductal dilatation or surrounding inflammatory changes. Spleen: The spleen measures 9.5 x 5.5 x 15.7 cm (volume = 430 cm^3)(AP x ML x CC series 3, image 51 and series 7, image 71) . Adrenals/Urinary  Tract: Adrenal glands are unremarkable. Kidneys are normal, without renal calculi, focal lesion, or hydronephrosis. Bladder is unremarkable. Stomach/Bowel: Stomach is within normal limits. Appendix not identified. No bowel wall thickening  or inflammatory changes. Mild nonspecific distention of the transverse colon. Vascular/Lymphatic: Aortic atherosclerosis with moderate calcific atherosclerosis. No enlarged abdominal or pelvic lymph nodes. Reproductive: Uterus and bilateral adnexa are unremarkable. Other: No abdominal wall hernia or abnormality. No abdominopelvic ascites. Musculoskeletal: L4-S1 posterior instrumented fusion, lateral mass fusion, and laminectomy. No periprosthetic fracture or lucency identified. Stable severe compression deformity of T11 vertebral body with 6 mm retropulsion of superior endplate. Stable chronic superior endplate fracture of T3 vertebral body with mild loss of height. Edema within the subcutaneous fat of the abdominal wall asymmetrically to the right and posteriorly. IMPRESSION: 1. Suboptimal assessment of soft tissues without intravenous contrast. 2. Trace right pleural effusion.  No consolidation. 3. Mildly patulous and fluid-filled esophagus. 4. Splenomegaly, volume of 430 cc. 5. Mild nonspecific distention of the transverse colon without appreciable inflammatory change. 6. Mild thoracic and moderate abdominal aortic calcific atherosclerosis. 7. Stable severe T11 compression deformity with 6 mm retropulsion superior endplate mild T3 compression deformity. 8. Multiple chronic rib fracture deformities. 9. Edema in subcutaneous fat of right lateral and posterior thoracoabdominal walls, probably third-spacing with asymmetry due to patient positioning. Electronically Signed   By: Mitzi Hansen M.D.   On: Oct 26, 2016 17:32   Ct Angio Chest Pe W Or Wo Contrast  Result Date: 10/24/2016 CLINICAL DATA:  Inpatient. Respiratory failure. Trauma. Clinical high pretest probability  for PE. EXAM: CT ANGIOGRAPHY CHEST WITH CONTRAST TECHNIQUE: Multidetector CT imaging of the chest was performed using the standard protocol during bolus administration of intravenous contrast. Multiplanar CT image reconstructions and MIPs were obtained to evaluate the vascular anatomy. CONTRAST:  72 cc Isovue 370 IV. COMPARISON:  Chest radiograph from earlier today. Oct 26, 2016 chest CT. FINDINGS: Cardiovascular: The study is high quality for the evaluation of pulmonary embolism. There is an isolated subsegmental pulmonary embolus in the right lower lobe (series 9/ image 314). Otherwise normal pulmonary arterial tree with no central, lobar or segmental pulmonary embolism. Mildly atherosclerotic nonaneurysmal thoracic aorta. Normal caliber main pulmonary artery (2.9 cm diameter). Normal heart size. No significant pericardial fluid/thickening. Left internal jugular central venous catheter terminates in the upper third of the superior vena cava. Mediastinum/Nodes: No discrete thyroid nodules. Unremarkable esophagus. Enteric tube terminates in the proximal body of the stomach. No pathologically enlarged axillary, mediastinal or hilar lymph nodes. Lungs/Pleura: No pneumothorax. No pleural effusion. Endotracheal tube tip is 3.5 cm above the carina. Mild atelectasis in the dependent and basilar lungs bilaterally. Otherwise no acute consolidative airspace disease, lung masses or significant pulmonary nodules in the aerated portions of the lungs. Upper abdomen: Unremarkable. Musculoskeletal: No aggressive appearing focal osseous lesions. Acute anterior right fifth rib fracture. Multiple healing fractures in the posterior ribs bilaterally. Stable mild to moderate T3 vertebral compression fracture. Stable severe T11 vertebral compression fracture. Mild thoracic spondylosis. Subcutaneous edema at throughout the right greater the left supraclavicular regions bilaterally and throughout the dependent back, right greater the left.  Review of the MIP images confirms the above findings. IMPRESSION: 1. Isolated acute subsegmental pulmonary embolism in the right lower lobe. 2. Well-positioned support structures. 3. Mild atelectasis in the dependent and basilar lungs bilaterally. 4. Anterior right fifth rib acute fracture. Multiple healing bilateral posterior rib fractures bilaterally. Stable T3 and T11 vertebral compression fractures. Aortic Atherosclerosis (ICD10-I70.0). Critical Value/emergent results were called by telephone at the time of interpretation on 10/24/2016 at 10:38 am to Dr. Jamison Neighbor, who verbally acknowledged these results. Electronically Signed   By: Delbert Phenix M.D.   On: 10/24/2016 10:56  US Abdomen Port  Result Date: 10/24/2016 CLINICAL DATA:  Elevated liver transaminases and acute renal failure. EXAM: ABDOMEN ULTRASOUND COMPLETE COMPARISON:  CT of the abdomen on 11/02/2016 FINDINGS: Gallbladder: Diffuse sludge is present throughout the gallbladder lumen. At least 1 small calculus is present. There is no evidence of overt gallbladder wall thickening or elicited sonographic Murphy sign. Common bile duct: Diameter: 6 mm. Liver: The liver demonstrates coarse echotexture and increased echogenicity, likely reflecting diffuse steatosis. No overt cirrhotic contour abnormalities or focal lesions are identified. There is no evidence of intrahepatic biliary ductal dilatation. The portal vein is open with hepatopetal flow. IVC: No abnormality visualized. Pancreas: Visualized portion unremarkable. Spleen: Size and appearance within normal limits. Right Kidney: Length: 12.8 cm. Renal cortex mildly echogenic. No significant atrophy. No hydronephrosis or focal lesion. Left Kidney: Length: 11.4 cm. Renal cortex mildly echogenic. No significant atrophy. No hydronephrosis or focal lesion. Abdominal aorta: No aneurysm visualized. Other findings: Small right pleural effusion visualized by ultrasound. IMPRESSION: 1. Diffuse biliary sludge in the  gallbladder lumen with at least one small calculus. No overt evidence of gallbladder inflammation or biliary obstruction. 2. Evidence of hepatic steatosis. 3. Mildly echogenic kidneys bilaterally without evidence of hydronephrosis or significant renal atrophy. Findings are suggestive of underlying chronic kidney disease. Electronically Signed   By: Irish Lack M.D.   On: 10/24/2016 17:34   Dg Chest Port 1 View  Result Date: 10/24/2016 CLINICAL DATA:  Ventilator dependent. EXAM: PORTABLE CHEST 1 VIEW COMPARISON:  Radiographs of October 23, 2016. FINDINGS: The heart size and mediastinal contours are within normal limits. Endotracheal and nasogastric tubes are in grossly good position. Left internal jugular catheter is stable with distal tip in expected position of the SVC. No pneumothorax or pleural effusion is noted. Right lung is clear. Stable left basilar subsegmental atelectasis. The visualized skeletal structures are unremarkable. IMPRESSION: Stable support apparatus. Stable left basilar subsegmental atelectasis. Electronically Signed   By: Lupita Raider, M.D.   On: 10/24/2016 07:53   Dg Chest Portable 1 View  Result Date: 11/02/2016 CLINICAL DATA:  Central line placement. History of COPD, drug abuse. EXAM: PORTABLE CHEST 1 VIEW COMPARISON:  Chest radiograph October 23, 2016 at 2105 hours FINDINGS: Endotracheal tube tip projects 3 cm above the carina, retracted from prior radiograph. Nasogastric tube past proximal stomach, distal tip not included. Multiple lines and wires overlie the patient. Cardiomediastinal silhouette is normal. Pulmonary vascular congestion without pleural effusion or focal consolidation. No pneumothorax. Soft tissue planes and included osseous structures are nonsuspicious. IMPRESSION: Retracted endotracheal tube tip projects 3 cm above the carina. Nasogastric tube past proximal stomach, distal tip not included. Pulmonary vascular congestion. Electronically Signed   By: Awilda Metro M.D.   On: 10/31/2016 22:00   Dg Chest Portable 1 View  Result Date: 10/31/2016 CLINICAL DATA:  Intubation.  Possible sepsis. EXAM: PORTABLE CHEST 1 VIEW COMPARISON:  None. FINDINGS: The endotracheal tube is in the right mainstem bronchus. There is leftward shift of the mediastinal structures. The right lung is clear. Otherwise limited assessment due to multiple overlying structures. IMPRESSION: Right mainstem bronchus intubation. These results were called by telephone at the time of interpretation on 10/28/2016 at 9:25 pm. At that time, per the clinical team, the endotracheal tube had been repositioned. Electronically Signed   By: Deatra Robinson M.D.   On: 11/12/2016 21:26   Dg Chest Port 1 View  Result Date: 11/03/2016 CLINICAL DATA:  Patient with shortness of breath. EXAM: PORTABLE CHEST 1  VIEW COMPARISON:  Chest radiograph 10/20/2016 FINDINGS: Monitoring leads overlie the patient. Patient is rotated to the left. Stable enlarged cardiac and mediastinal contours. No consolidative pulmonary opacities. No pleural effusion or pneumothorax. IMPRESSION: Cardiomegaly.  No acute cardiopulmonary process. Electronically Signed   By: Annia Belt M.D.   On: 10/19/2016 15:46   STUDIES:  UDS 8/7:  Positive for THC CT HEAD W/O 8/7:  No acute findings by head CT. CTA CHEST 8/8: IMPRESSION: 1. Isolated acute subsegmental pulmonary embolism in the right lower lobe. 2. Well-positioned support structures. 3. Mild atelectasis in the dependent and basilar lungs bilaterally. 4. Anterior right fifth rib acute fracture. Multiple healing bilateral posterior rib fractures bilaterally. Stable T3 and T11 vertebral compression fractures. TTE 8/8:  Patient tachycardic therefore unable to give accurate assessment of EF. Overall left ventricular function appears globally reduced with normal cavity size. LA & RA normal in size. Questionable thrombus within RV with poor visualization. Cavity size of RV appeared normal with  mild reduction in systolic function. Aortic valve poorly visualized but no obvious stenosis or regurgitation. Aortic root normal in size. No mitral stenosis or regurgitation. No pulmonic stenosis or regurgitation. No tricuspid regurgitation. Small free-flowing pericardial effusion at the apex. BILATERAL LE VENOUS DUPLEX 8/8 >>>  Preliminary negative for DVT/SVT.  PORT CXR 8/8:  Previously reviewed by me. No focal opacity appreciated. No pleural effusion. Left internal jugular central venous catheter in good position. Endotracheal tube in good position. Enteric feeding tube coursing below diaphragm. PORT ABD U/S COMPLETE 8/8: IMPRESSION: 1. Diffuse biliary sludge in the gallbladder lumen with at least one small calculus. No overt evidence of gallbladder inflammation or biliary obstruction. 2. Evidence of hepatic steatosis. 3. Mildly echogenic kidneys bilaterally without evidence of hydronephrosis or significant renal atrophy. Findings are suggestive of underlying chronic kidney disease.  MICROBIOLOGY: MRSA PCR 8/7:  Negative Blood Cultures x2 8/7 >>> 2/2 Bottles Positive GPC (MSSA on PCR) Urine Culture 8/7 >>>  Blood Cultures x2 8/9 >>> Fungal Blood Culture 8/9 >>>  ANTIBIOTICS: Ancef 8/8 (x1 dose) Diflucan 8/9 (x1 dose) Vancomycin 8/7 >>> Zosyn 8/7 >>>  Eraxis 8/9 >>>  SIGNIFICANT EVENTS:  8/07 - Presents to ED  8/08 - PEA arrest  LINES/TUBES: R RAD ART LINE (out) L FEM ART LINE 8/9 >>> OETT 7.5 8/7 >>> L IJ CVL 8/7 >>> OGT 8/7 >>> Foley 8/7 >>> PIV  ASSESSMENT / PLAN:  PULMONARY A: Acute Hypoxic Respiratory Failure:  Improving.  Acute Pulmonary Embolism:  Right lower lobe. Nondisplaced Rib Fractures:  Seen on chest imaging post resuscitation. History of COPD  P:   Weaning FiO2 for Sat >92% & PAO2 >55 Continuing full ventilator support No PEEP weaning until FiO2 </= 0.4 Holding on SBT & PS weaning until patient stabilizes Heparin drip per pharmacy  protocol Continuing Pulmicort nebs bid & Duoneb q6hr   CARDIOVASCULAR A:  Shock:  Multifactorial from sepsis & PE. Tachycardia:  Multifactorial in etiology. RV Thrombus:  Questionable seen on sub-optimal TTE 8/8. H/O Essential hypertension Hepatic Steatosis:  Seen on abdominal U/S.   P:  Adding Giapreza drip Discontinuing Albumin  Weaning pressors for MAP >65 & SBP >90 Vitals per unit protocol Continuous telemetry monitoring Plan to repeat TTE vs TEE once patient stabilizes   RENAL A:   Acute Renal Failure:  Oliguria to anuria. Worsening.  Metabolic/Lactic Acidosis:  Stable on Bicarb drip. Hyponatremia:  Mild.   P:   Holding on Renal Replacement Therapy. Continuing Bicarb drip at 150cc/hr Checking Ionized  Calcium Trending UOP with Foley  Repeating ABG at 5pm & continuing daily  Trending Lactic Acid  GASTROINTESTINAL A:   Shock Liver Coagulopathy:  Worsening. Likely due to worsening liver failure. GIB:  Coffee ground stomach contents. Reported Diarrhea:  Prior to arrival but non since admission. Transaminintis:  Mild.   P:   NPO Holding on tube feeding Protonix IV q12hr Trending LFTs q12hr  HEMATOLOGIC A:   GI Bleed:  Appears upper. Acute Pulmonary Embolism RV Thrombus:  Questionable. Leukocytosis:  Likely secondary to sepsis. Coagulopathy:  Likely due to cirrhosis and acute liver failure. Worsening.  P:  Continuing heparin drip per protocol Trending coags Heparin drip per pharmacy protocol Trending cell counts q12hr Transfuse for Hgb <7.0 or bright red blood  INFECTIOUS A:   MSSA Bacteremia Sepsis H/O Hepatitis C Viral Infection   P:   Continuing Empiric Vancomycin & Zosyn pending further culture results Starting Eraxis in place of Diflucan Trending Procalcitonin per algorithm Awaiting culture results  ID following - appreciate assistance   ENDOCRINE A:   Hypoglycemia:  Improved on dextrose IVF. H/O Diabetes Mellitus   P:    Discontinuing D10 infusion Continuing d5 in bicarb drip Accu-checks q4hr with MD notification parameters  NEUROLOGIC A:   Acute Encephalopathy:  Multifactorial with likely a component of anoxic injury post arrest and toxic metabolic. Sedation on Ventilator Analgesia for rib fractures Chronic Pain History of Overdose & Suicidal Ideation History of IVDU  P:   RASS goal: 0 to -1 Fentanyl gtt & IV prn pain Versed IV prn sedation  FAMILY  - Updates:  Family updated 8/8 after my rounds. No family during rounds today.   - Inter-disciplinary family meet or Palliative Care meeting due by:  8/14  DISCUSSION:  51 y.o. female with multisystem organ failure secondary to septic shock. Prognosis is poor. Suspect she may not survive this illness.  I have spent an additional total of 34 minutes of critical care time today caring for the patient and reviewing the patient's electronic medical record.   Donna ChristenJennings E. Jamison NeighborNestor, M.D. [facility] Pager:  [phone] After 3pm or if no response, call [phone] 8:17 AM 10/25/16

## 2016-10-25 NOTE — Progress Notes (Signed)
Worsening fever over course of the day and worsening leukocytosis and lactic acidosis. Blood cultures grew only 1/2 bottles MSSA which may be a contaminant. Had colonic distension on Abd CT and still has significant abdominal pain to palpation. Concern could possibly be fungemic from translocation of gut flora. Will add Fluconazole. Repeat blood cultures.

## 2016-10-26 DIAGNOSIS — I2699 Other pulmonary embolism without acute cor pulmonale: Secondary | ICD-10-CM

## 2016-10-26 DIAGNOSIS — Z515 Encounter for palliative care: Secondary | ICD-10-CM

## 2016-10-26 DIAGNOSIS — I469 Cardiac arrest, cause unspecified: Secondary | ICD-10-CM

## 2016-10-26 LAB — CBC WITH DIFFERENTIAL/PLATELET
BASOS PCT: 1 %
BASOS PCT: 0 %
Band Neutrophils: 0 %
Basophils Absolute: 0.3 10*3/uL — ABNORMAL HIGH (ref 0.0–0.1)
Basophils Absolute: 0 10*3/uL (ref 0.0–0.1)
Blasts: 0 %
EOS ABS: 0 10*3/uL (ref 0.0–0.7)
EOS PCT: 0 %
Eosinophils Absolute: 0 10*3/uL (ref 0.0–0.7)
Eosinophils Relative: 0 %
HEMATOCRIT: 28.5 % — AB (ref 36.0–46.0)
HEMATOCRIT: 27.1 % — AB (ref 36.0–46.0)
HEMOGLOBIN: 9.7 g/dL — AB (ref 12.0–15.0)
HEMOGLOBIN: 9.2 g/dL — AB (ref 12.0–15.0)
LYMPHS ABS: 1.6 10*3/uL (ref 0.7–4.0)
LYMPHS PCT: 5 %
LYMPHS PCT: 4 %
Lymphs Abs: 1.4 10*3/uL (ref 0.7–4.0)
MCH: 29.8 pg (ref 26.0–34.0)
MCH: 30 pg (ref 26.0–34.0)
MCHC: 34 g/dL (ref 30.0–36.0)
MCHC: 33.9 g/dL (ref 30.0–36.0)
MCV: 87.7 fL (ref 78.0–100.0)
MCV: 88.3 fL (ref 78.0–100.0)
MONO ABS: 0.6 10*3/uL (ref 0.1–1.0)
MONO ABS: 0.3 10*3/uL (ref 0.1–1.0)
Metamyelocytes Relative: 0 %
Monocytes Relative: 2 %
Monocytes Relative: 1 %
Myelocytes: 0 %
NEUTROS ABS: 33.1 10*3/uL — AB (ref 1.7–7.7)
NEUTROS PCT: 92 %
NEUTROS PCT: 95 %
NRBC: 0 /100{WBCs}
Neutro Abs: 29.1 10*3/uL — ABNORMAL HIGH (ref 1.7–7.7)
Platelets: 81 10*3/uL — ABNORMAL LOW (ref 150–400)
Platelets: 80 10*3/uL — ABNORMAL LOW (ref 150–400)
Promyelocytes Absolute: 0 %
RBC: 3.25 MIL/uL — ABNORMAL LOW (ref 3.87–5.11)
RBC: 3.07 MIL/uL — AB (ref 3.87–5.11)
RDW: 14.8 % (ref 11.5–15.5)
RDW: 14.9 % (ref 11.5–15.5)
WBC: 31.6 10*3/uL — AB (ref 4.0–10.5)
WBC: 34.8 10*3/uL — AB (ref 4.0–10.5)

## 2016-10-26 LAB — BLOOD GAS, ARTERIAL
ACID-BASE EXCESS: 3.6 mmol/L — AB (ref 0.0–2.0)
ACID-BASE EXCESS: 2.3 mmol/L — AB (ref 0.0–2.0)
BICARBONATE: 27.9 mmol/L (ref 20.0–28.0)
BICARBONATE: 26.5 mmol/L (ref 20.0–28.0)
Drawn by: 511841
FIO2: 60
FIO2: 50
LHR: 30 {breaths}/min
LHR: 30 {breaths}/min
O2 SAT: 93.7 %
O2 SAT: 91.9 %
PCO2 ART: 42.1 mmHg (ref 32.0–48.0)
PEEP/CPAP: 14 cmH2O
PEEP/CPAP: 14 cmH2O
PH ART: 7.391 (ref 7.350–7.450)
PH ART: 7.414 (ref 7.350–7.450)
Patient temperature: 102
Patient temperature: 98.6
VT: 420 mL
VT: 420 mL
pCO2 arterial: 48.2 mmHg — ABNORMAL HIGH (ref 32.0–48.0)
pO2, Arterial: 82.1 mmHg — ABNORMAL LOW (ref 83.0–108.0)
pO2, Arterial: 68.4 mmHg — ABNORMAL LOW (ref 83.0–108.0)

## 2016-10-26 LAB — CULTURE, BLOOD (ROUTINE X 2)
SPECIAL REQUESTS: ADEQUATE
Special Requests: ADEQUATE

## 2016-10-26 LAB — GLUCOSE, CAPILLARY
GLUCOSE-CAPILLARY: 130 mg/dL — AB (ref 65–99)
GLUCOSE-CAPILLARY: 150 mg/dL — AB (ref 65–99)
GLUCOSE-CAPILLARY: 167 mg/dL — AB (ref 65–99)
GLUCOSE-CAPILLARY: 20 mg/dL — AB (ref 65–99)
GLUCOSE-CAPILLARY: 55 mg/dL — AB (ref 65–99)
Glucose-Capillary: 135 mg/dL — ABNORMAL HIGH (ref 65–99)
Glucose-Capillary: 150 mg/dL — ABNORMAL HIGH (ref 65–99)
Glucose-Capillary: 109 mg/dL — ABNORMAL HIGH (ref 65–99)
Glucose-Capillary: 101 mg/dL — ABNORMAL HIGH (ref 65–99)

## 2016-10-26 LAB — COMPREHENSIVE METABOLIC PANEL
ALBUMIN: 1.6 g/dL — AB (ref 3.5–5.0)
ALK PHOS: 141 U/L — AB (ref 38–126)
ALK PHOS: 166 U/L — AB (ref 38–126)
ALT: 199 U/L — AB (ref 14–54)
ALT: 177 U/L — ABNORMAL HIGH (ref 14–54)
ANION GAP: 23 — AB (ref 5–15)
ANION GAP: 22 — AB (ref 5–15)
AST: 1478 U/L — ABNORMAL HIGH (ref 15–41)
AST: 1487 U/L — ABNORMAL HIGH (ref 15–41)
Albumin: 1.9 g/dL — ABNORMAL LOW (ref 3.5–5.0)
BILIRUBIN TOTAL: 3.9 mg/dL — AB (ref 0.3–1.2)
BILIRUBIN TOTAL: 4.3 mg/dL — AB (ref 0.3–1.2)
BUN: 46 mg/dL — ABNORMAL HIGH (ref 6–20)
BUN: 51 mg/dL — ABNORMAL HIGH (ref 6–20)
CALCIUM: 5.9 mg/dL — AB (ref 8.9–10.3)
CALCIUM: 5.8 mg/dL — AB (ref 8.9–10.3)
CO2: 26 mmol/L (ref 22–32)
CO2: 27 mmol/L (ref 22–32)
CREATININE: 4.09 mg/dL — AB (ref 0.44–1.00)
Chloride: 80 mmol/L — ABNORMAL LOW (ref 101–111)
Chloride: 79 mmol/L — ABNORMAL LOW (ref 101–111)
Creatinine, Ser: 4.37 mg/dL — ABNORMAL HIGH (ref 0.44–1.00)
GFR calc non Af Amer: 11 mL/min — ABNORMAL LOW (ref >60)
GFR, EST AFRICAN AMERICAN: 14 mL/min — AB (ref >60)
GFR, EST AFRICAN AMERICAN: 12 mL/min — AB (ref >60)
GFR, EST NON AFRICAN AMERICAN: 12 mL/min — AB (ref >60)
GLUCOSE: 87 mg/dL (ref 65–99)
Glucose, Bld: 147 mg/dL — ABNORMAL HIGH (ref 65–99)
POTASSIUM: 4.8 mmol/L (ref 3.5–5.1)
Potassium: 4.7 mmol/L (ref 3.5–5.1)
SODIUM: 129 mmol/L — AB (ref 135–145)
SODIUM: 128 mmol/L — AB (ref 135–145)
TOTAL PROTEIN: 3.9 g/dL — AB (ref 6.5–8.1)
TOTAL PROTEIN: 4.1 g/dL — AB (ref 6.5–8.1)

## 2016-10-26 LAB — PROTIME-INR: Prothrombin Time: >90 seconds — ABNORMAL HIGH (ref 11.4–15.2)

## 2016-10-26 LAB — LACTIC ACID, PLASMA: LACTIC ACID, VENOUS: 9 mmol/L — AB (ref 0.5–1.9)

## 2016-10-26 LAB — MAGNESIUM: Magnesium: 1.7 mg/dL (ref 1.7–2.4)

## 2016-10-26 LAB — PHOSPHORUS: PHOSPHORUS: 7.2 mg/dL — AB (ref 2.5–4.6)

## 2016-10-26 LAB — CALCIUM, IONIZED

## 2016-10-26 LAB — HEPATITIS B SURFACE ANTIGEN: HEP B S AG: NEGATIVE

## 2016-10-26 MED ORDER — DEXTROSE 50 % IV SOLN
INTRAVENOUS | Status: AC
  Administered 2016-10-26: 50 mL via INTRAVENOUS
  Filled 2016-10-26: qty 50

## 2016-10-26 MED ORDER — DEXTROSE 50 % IV SOLN
50.0000 mL | Freq: Once | INTRAVENOUS | Status: AC
  Administered 2016-10-26: 50 mL via INTRAVENOUS
  Filled 2016-10-26: qty 50

## 2016-10-26 MED ORDER — DEXTROSE 10 % IV SOLN
INTRAVENOUS | Status: DC
  Administered 2016-10-26: via INTRAVENOUS

## 2016-10-26 MED ORDER — DEXTROSE 50 % IV SOLN
25.0000 mL | Freq: Once | INTRAVENOUS | Status: AC
  Administered 2016-10-26: 25 mL via INTRAVENOUS

## 2016-10-26 MED ORDER — DEXTROSE 50 % IV SOLN
INTRAVENOUS | Status: AC
  Filled 2016-10-26: qty 50

## 2016-10-26 MED ORDER — SODIUM CHLORIDE 0.9 % IV SOLN
2.0000 g | Freq: Once | INTRAVENOUS | Status: AC
  Administered 2016-10-26: 2 g via INTRAVENOUS
  Filled 2016-10-26: qty 20

## 2016-10-26 MED ORDER — SODIUM CHLORIDE 0.9 % IV BOLUS (SEPSIS)
1000.0000 mL | Freq: Once | INTRAVENOUS | Status: AC
  Administered 2016-10-26: 1000 mL via INTRAVENOUS

## 2016-10-26 MED ORDER — HEPARIN SODIUM (PORCINE) 5000 UNIT/ML IJ SOLN
5000.0000 [IU] | Freq: Three times a day (TID) | INTRAMUSCULAR | Status: DC
  Administered 2016-10-26 – 2016-10-27 (×4): 5000 [IU] via SUBCUTANEOUS
  Filled 2016-10-26 (×7): qty 1

## 2016-10-26 MED ORDER — PIPERACILLIN-TAZOBACTAM IN DEX 2-0.25 GM/50ML IV SOLN
2.2500 g | Freq: Three times a day (TID) | INTRAVENOUS | Status: DC
  Administered 2016-10-26 – 2016-10-27 (×4): 2.25 g via INTRAVENOUS
  Filled 2016-10-26 (×6): qty 50

## 2016-10-26 NOTE — Progress Notes (Signed)
   10/26/16 1150  Clinical Encounter Type  Visited With Patient and family together  Visit Type Follow-up  Spiritual Encounters  Spiritual Needs Emotional  Stress Factors  Patient Stress Factors Major life changes  Family Stress Factors None identified  Checking in with family. [employer].

## 2016-10-26 NOTE — Progress Notes (Signed)
CRITICAL VALUE ALERT  Critical Value:  Ca 5.8  Date & Time Notied:  10/26/16 1935  Provider Notified: E-link called, RN-Gretchen notified  Orders Received/Actions taken: Awaiting Md call back

## 2016-10-26 NOTE — Progress Notes (Signed)
Name: Mallory Medina MRN: [id number] DOB: [birth year]    ADMISSION DATE:  Nov 09, 2016 CONSULTATION DATE:  11-09-2016  REFERRING MD :  Dr. Madilyn Hook   CHIEF COMPLAINT:  Hypotension/Sepsis   HISTORY OF PRESENT ILLNESS:  51 y.o. female current smoker with PMH of Chronic Pain Back, COPD, DM, Hepatitis C, HTN, IV drug abuse, non-compliant with medications due to financial restraints. Presented to ED on 8/7 with lethargy and hypotension. Upon EMS arrival oxygen saturation 78% on room air. Upon arrival to ED BP 97/86, WBC 18.7, LA 16.58, ABG 7.1/30.1/241. Given 3L NS. Mental Status began to improve. Patient stated that she has a chronic productive cough with black to yellow sputum, chronic dyspnea, several days of burning will urinating, and generalized pain/fatiuge. Has been bed bound for the last 3 days, reports N/V/D during this time as well. PCCM asked to admit. Presented to ED on 8/4 with upper back pain with dyspnea and a productive cough for 3 says. CXR not revealing of PNA, discharged with bronchodilators, steroids, and azithromycin.  SUBJECTIVE:  Slight decrease in Levophed requirement. Heparin drip stopped due to bleeding from femoral arterial line & decreasing hemoglobin.   REVIEW OF SYSTEMS:  Unable to obtain given altered mentation & intubation.   VITAL SIGNS: Temp:  [100 F (37.8 C)-103.1 F (39.5 C)] 101.7 F (38.7 C) (08/10 0700) Pulse Rate:  [130-147] 130 (08/10 0716) Resp:  [30-36] 35 (08/10 0716) BP: (83-206)/(42-93) 206/81 (08/10 0716) SpO2:  [91 %-100 %] 97 % (08/10 0716) Arterial Line BP: (89-114)/(50-69) 98/60 (08/10 0700) FiO2 (%):  [60 %] 60 % (08/10 0800) Weight:  [222 lb 10.6 oz (101 kg)] 222 lb 10.6 oz (101 kg) (08/10 0500)  PHYSICAL EXAMINATION: General:  Sedated. No distress. Multiple family members at bedside. Integument:  Mottled in extremities. Increased bruising and bullae over right flank & lower abdomen. No rash.  HEENT:  ETT in place. Scleral edema with mild  icterus. Cardiovascular:  Regular rhythm. Mild anasarca.  Pulmonary:  Symmetric chest rise. Clear to auscultation. Abdomen: Soft. Hypoactive bowel sounds. Protuberant.  Neurological:  Sedated. Pupils symmetric.    Recent Labs Lab 10/25/16 0127 10/25/16 1512 10/26/16 0319  NA 131* 128* 129*  K 4.2 4.6 4.7  CL 83* 82* 80*  CO2 22 24 26   BUN 41* 41* 46*  CREATININE 3.44* 3.75* 4.09*  GLUCOSE 194* 112* 147*    Recent Labs Lab 10/25/16 0127 10/25/16 1620 10/26/16 0319  HGB 11.3* 9.8* 9.7*  HCT 32.7* 29.1* 28.5*  WBC 40.3* 32.9* 31.6*  PLT 135* 95* 81*   Ct Angio Chest Pe W Or Wo Contrast  Result Date: 10/24/2016 CLINICAL DATA:  Inpatient. Respiratory failure. Trauma. Clinical high pretest probability for PE. EXAM: CT ANGIOGRAPHY CHEST WITH CONTRAST TECHNIQUE: Multidetector CT imaging of the chest was performed using the standard protocol during bolus administration of intravenous contrast. Multiplanar CT image reconstructions and MIPs were obtained to evaluate the vascular anatomy. CONTRAST:  72 cc Isovue 370 IV. COMPARISON:  Chest radiograph from earlier today. 11/09/2016 chest CT. FINDINGS: Cardiovascular: The study is high quality for the evaluation of pulmonary embolism. There is an isolated subsegmental pulmonary embolus in the right lower lobe (series 9/ image 314). Otherwise normal pulmonary arterial tree with no central, lobar or segmental pulmonary embolism. Mildly atherosclerotic nonaneurysmal thoracic aorta. Normal caliber main pulmonary artery (2.9 cm diameter). Normal heart size. No significant pericardial fluid/thickening. Left internal jugular central venous catheter terminates in the upper third of the superior vena cava. Mediastinum/Nodes:  No discrete thyroid nodules. Unremarkable esophagus. Enteric tube terminates in the proximal body of the stomach. No pathologically enlarged axillary, mediastinal or hilar lymph nodes. Lungs/Pleura: No pneumothorax. No pleural effusion.  Endotracheal tube tip is 3.5 cm above the carina. Mild atelectasis in the dependent and basilar lungs bilaterally. Otherwise no acute consolidative airspace disease, lung masses or significant pulmonary nodules in the aerated portions of the lungs. Upper abdomen: Unremarkable. Musculoskeletal: No aggressive appearing focal osseous lesions. Acute anterior right fifth rib fracture. Multiple healing fractures in the posterior ribs bilaterally. Stable mild to moderate T3 vertebral compression fracture. Stable severe T11 vertebral compression fracture. Mild thoracic spondylosis. Subcutaneous edema at throughout the right greater the left supraclavicular regions bilaterally and throughout the dependent back, right greater the left. Review of the MIP images confirms the above findings. IMPRESSION: 1. Isolated acute subsegmental pulmonary embolism in the right lower lobe. 2. Well-positioned support structures. 3. Mild atelectasis in the dependent and basilar lungs bilaterally. 4. Anterior right fifth rib acute fracture. Multiple healing bilateral posterior rib fractures bilaterally. Stable T3 and T11 vertebral compression fractures. Aortic Atherosclerosis (ICD10-I70.0). Critical Value/emergent results were called by telephone at the time of interpretation on 10/24/2016 at 10:38 am to Dr. Jamison NeighborNESTOR, who verbally acknowledged these results. Electronically Signed   By: Delbert PhenixJason A Poff M.D.   On: 10/24/2016 10:56   [place] Abdomen Port  Result Date: 10/24/2016 CLINICAL DATA:  Elevated liver transaminases and acute renal failure. EXAM: ABDOMEN ULTRASOUND COMPLETE COMPARISON:  CT of the abdomen on 11/28/16 FINDINGS: Gallbladder: Diffuse sludge is present throughout the gallbladder lumen. At least 1 small calculus is present. There is no evidence of overt gallbladder wall thickening or elicited sonographic Murphy sign. Common bile duct: Diameter: 6 mm. Liver: The liver demonstrates coarse echotexture and increased echogenicity, likely  reflecting diffuse steatosis. No overt cirrhotic contour abnormalities or focal lesions are identified. There is no evidence of intrahepatic biliary ductal dilatation. The portal vein is open with hepatopetal flow. IVC: No abnormality visualized. Pancreas: Visualized portion unremarkable. Spleen: Size and appearance within normal limits. Right Kidney: Length: 12.8 cm. Renal cortex mildly echogenic. No significant atrophy. No hydronephrosis or focal lesion. Left Kidney: Length: 11.4 cm. Renal cortex mildly echogenic. No significant atrophy. No hydronephrosis or focal lesion. Abdominal aorta: No aneurysm visualized. Other findings: Small right pleural effusion visualized by ultrasound. IMPRESSION: 1. Diffuse biliary sludge in the gallbladder lumen with at least one small calculus. No overt evidence of gallbladder inflammation or biliary obstruction. 2. Evidence of hepatic steatosis. 3. Mildly echogenic kidneys bilaterally without evidence of hydronephrosis or significant renal atrophy. Findings are suggestive of underlying chronic kidney disease. Electronically Signed   By: Irish LackGlenn  Yamagata M.D.   On: 10/24/2016 17:34   STUDIES:  UDS 8/7:  Positive for THC CT HEAD W/O 8/7:  No acute findings by head CT. CTA CHEST 8/8: IMPRESSION: 1. Isolated acute subsegmental pulmonary embolism in the right lower lobe. 2. Well-positioned support structures. 3. Mild atelectasis in the dependent and basilar lungs bilaterally. 4. Anterior right fifth rib acute fracture. Multiple healing bilateral posterior rib fractures bilaterally. Stable T3 and T11 vertebral compression fractures. TTE 8/8:  Patient tachycardic therefore unable to give accurate assessment of EF. Overall left ventricular function appears globally reduced with normal cavity size. LA & RA normal in size. Questionable thrombus within RV with poor visualization. Cavity size of RV appeared normal with mild reduction in systolic function. Aortic valve poorly  visualized but no obvious stenosis or regurgitation. Aortic root normal in  size. No mitral stenosis or regurgitation. No pulmonic stenosis or regurgitation. No tricuspid regurgitation. Small free-flowing pericardial effusion at the apex. BILATERAL LE VENOUS DUPLEX 8/8 >>>  Preliminary negative for DVT/SVT.  PORT CXR 8/8:  Previously reviewed by me. No focal opacity appreciated. No pleural effusion. Left internal jugular central venous catheter in good position. Endotracheal tube in good position. Enteric feeding tube coursing below diaphragm. PORT ABD U/S COMPLETE 8/8: IMPRESSION: 1. Diffuse biliary sludge in the gallbladder lumen with at least one small calculus. No overt evidence of gallbladder inflammation or biliary obstruction. 2. Evidence of hepatic steatosis. 3. Mildly echogenic kidneys bilaterally without evidence of hydronephrosis or significant renal atrophy. Findings are suggestive of underlying chronic kidney disease.  MICROBIOLOGY: MRSA PCR 8/7:  Negative Blood Cultures x2 8/7 >>> 2/2 Bottles Positive GPC (MSSA on PCR) Urine Culture 8/7 >>>  Blood Cultures x2 8/9 >>> Fungal Blood Culture 8/9 >>>  ANTIBIOTICS: Ancef 8/8 (x1 dose) Diflucan 8/9 (x1 dose) Vancomycin 8/7 - 8/10 Zosyn 8/7 >>>  Eraxis 8/9 >>>  SIGNIFICANT EVENTS:  8/07 - Presents to ED  8/08 - PEA arrest 8/09 - Heparin drip stopped w/ active bleeding from femoral arterial line & decreasing hemoglobin with blood loss  LINES/TUBES: R RAD ART LINE (out) L FEM ART LINE 8/9 >>> OETT 7.5 8/7 >>> L IJ CVL 8/7 >>> OGT 8/7 >>> Foley 8/7 >>> PIV  ASSESSMENT / PLAN:  PULMONARY A: Acute Hypoxic Respiratory Failure:  Improving.  Acute Pulmonary Embolism:  Right lower lobe. Anticoagulation stopped w/ active bleeding & worsening anemia 8/9.  Nondisplaced Rib Fractures:  Seen on chest imaging post resuscitation. History of COPD  P:   Continuing full ventilator support Weaning FiO2 for Sat >92% & PAO2  >55 Holding on SBT & PS wean Continuing Pulmicort neb BID & Duonebs q6hr Holding systemic anticoagluation  CARDIOVASCULAR A:  Shock:  Multifactorial from sepsis, blood loss, & PE. Slight improvement. Tachycardia:  Multifactorial in etiology. RV Thrombus:  Questionable seen on sub-optimal TTE 8/8. H/O Essential hypertension Hepatic Steatosis:  Seen on abdominal U/S.   P:  Continuing vasopressors for goal MAP >65 & SBP >90 Vitals per unit protocol Continuous telemetry monitoring Plan to repeat TTE vs TEE once patient stabilizes   RENAL A:   Acute Renal Failure:  Oliguria to anuria. Worsening.  Metabolic/Lactic Acidosis:  Stable to improving on Bicarb drip. Hyponatremia:  Mild and stable.  Hypocalcemia:  Replaced with IV Calcium Gluconate overnight.   P:   Holding on Renal Replacement Therapy Continuing Bicarb drip at 100cc/hr Trending ABG q12hr Repeat Ionized Calcium pending Monitoring UOP Trending electrolytes q12hr Trending daily Lactic Acid  GASTROINTESTINAL A:   Shock Liver:  Possible improvement today.  Coagulopathy:  Worsening. Likely due to worsening liver failure. GIB:  Coffee ground stomach contents. Reported Diarrhea:  Prior to arrival but non since admission.  P:   NPO Holding on tube feeding Protonix IV q12hr Trending LFTs q12hr  HEMATOLOGIC A:   Anemia:  Multifactorial from blood loss from line, bruising, & GI Bleeding. GI Bleed:  Appears upper. No evidence of active bleeding. Acute Pulmonary Embolism:  Off anticoagulation 8/9 w/ blood loss. RV Thrombus:  Questionable. Leukocytosis:  Likely secondary to sepsis. Improving. Coagulopathy:  Likely due to cirrhosis and acute liver failure. Worsening. Thrombocytopenia:  Worsening. Likely multifactorial with sepsis & consumption.   P:  Holding systemic anticoagulation Starting Heparin [place] q8hr for DVT prophylaxis Ordering SCDs Trending Coags daily Trending cell counts q12hr Transfuse for Hgb  <  7.0  INFECTIOUS A:   MSSA Bacteremia Sepsis H/O Hepatitis C Viral Infection   P:   Discontinuing Vancomycin today per ID recommendations Continuing Empiric Zosyn & Eraxis Trending Procalcitonin per algorithm Awaiting culture results  ID following - appreciate assistance   ENDOCRINE A:   Hypoglycemia:  Improved on dextrose IVF. H/O Diabetes Mellitus   P:   Continuing d5 in bicarb drip Accu-checks q4hr with MD notification parameters  NEUROLOGIC A:   Acute Encephalopathy:  Multifactorial with likely a component of anoxic injury post arrest and toxic metabolic. Sedation on Ventilator Analgesia for rib fractures Chronic Pain History of Overdose & Suicidal Ideation History of IVDU  P:   RASS goal: 0 to -1 Fentanyl gtt & IV prn pain Versed IV prn sedation  FAMILY  - Updates:  Family updated 8/10 during rounds. See Plan of Care note.   - Inter-disciplinary family meet or Palliative Care meeting due by:  8/14  DISCUSSION:  51 y.o. Female with severe sepsis with septic shock. Multi-system organ failure. Dismal prognosis. Ongoing family discussions regarding possible withdrawal of care. Code status changed to limited code today/DNR.   I have spent a total of 32 minutes of critical care time today caring for the patient and reviewing the patient's electronic medical record.   Donna Christen Jamison Neighbor, M.D. [facility] Pulmonary & Critical Care Pager:  [phone] After 3pm or if no response, call [phone] 9:01 AM 10/26/16

## 2016-10-26 NOTE — Plan of Care (Signed)
  Interdisciplinary Goals of Care Family Meeting   Date carried out:: 10/26/2016  Location of the meeting: Bedside  Member's involved: Physician, Bedside Registered Nurse and Family Member or next of kin  Durable Power of Attorney or acting medical decision maker: Family (daughters & brother).    Discussion: We discussed goals of care for MirantJatina W Yandell .  Updating brother via phone, sister-in-law, & daughters at bedside. Explained multi-system organ failure and low probability of recovery. Also explained the lengthy recovery process with likelihood of trach, dialysis, etc. Family reports she likely would not want this and want to have further discussions amongst themselves. They all agree to a DNR status at this point but will continue with aggressive medical care otherwise. They declined palliative medicine and chaplain consults.   Code status: Limited Code or DNR with short term  Disposition: Continue current acute care  Time spent for the meeting: 22 minutes   Lawanda CousinsJennings Kahlan Engebretson 10/26/2016, 9:54 AM

## 2016-10-26 NOTE — Progress Notes (Signed)
eLink Physician-Brief Progress Note Patient Name: Mallory BoltJatina W Medina DOB: [birth year] MRN: [id number]   Date of Service  10/26/2016  HPI/Events of Note  Hypoglycemia  eICU Interventions  D10 ordered     Intervention Category Major Interventions: Other:  Milli Woolridge 10/26/2016, 11:50 PM

## 2016-10-26 NOTE — Clinical Social Work Note (Signed)
Clinical Social Work Assessment  Patient Details  Name: Mallory Medina Payment MRN: [id number] Date of Birth: May 17, 1965  Date of referral:  10/26/16               Reason for consult:  Substance Use/ETOH Abuse                Permission sought to share information with:  Family Supports Permission granted to share information::  Yes, Verbal Permission Granted  Name::        Agency::     Relationship::     Contact Information:     Housing/Transportation Living arrangements for the past 2 months:  Single Family Home (with daughter Slaytonandance. ) Source of Information:  Adult Children Harriett Sine(Nancy (daughter) ) Patient Interpreter Needed:  None Criminal Activity/Legal Involvement Pertinent to Current Situation/Hospitalization:  No - Comment as needed Significant Relationships:  Adult Children, Other Family Members Lives with:  Adult Children Do you feel safe going back to the place where you live?  Yes Need for family participation in patient care:  Yes (Comment)  Care giving concerns:  CSW was informed that pt is currently intubated. For the purpose of this assessment CSW spoke with daughter Harriett Sineancy to obtain information. At this time per Harriett SineNancy reports, there are no concerns for her or the family about pt.    Social Worker assessment / plan:  CSW spoke with pt's daughter nancy via phone. CSW was informed that pt has lived with older daughter Sonny MastersCandace for about two years now. Candace as well as Harriett Sineancy are pt's primary supports. CSW was also notified that pt has other relatives that help pt out when and if needed. CSW was informed that family is agreeable to rehab if needed at the time of discharge so that pt can regain strength and get back to self.   Employment status:  Other (Comment) Insurance information:  Self Pay (Medicaid Pending) PT Recommendations:  Not assessed at this time Information / Referral to community resources:     Patient/Family's Response to care:  Family and daughter of pt appeared to  understand pt's plan of care at this time.   Patient/Family's Understanding of and Emotional Response to Diagnosis, Current Treatment, and Prognosis:  No further questions or concerns have been presented by family.   Emotional Assessment Appearance:    Attitude/Demeanor/Rapport:  Unable to Assess Affect (typically observed):  Unable to Assess Orientation:   (pt currently intubated at this time. ) Alcohol / Substance use:  Tobacco Use, Illicit Drugs (marijuana ) Psych involvement (Current and /or in the community):  No (Comment)  Discharge Needs  Concerns to be addressed:  No discharge needs identified Readmission within the last 30 days:  No Current discharge risk:  None Barriers to Discharge:  No Barriers Identified   Robb MatarKierra S Samyak Sackmann, LCSWA 10/26/2016, 3:34 PM

## 2016-10-26 NOTE — Progress Notes (Signed)
Nutrition Brief Note  Discussed patient in ICU rounds and with RN today. Patient has a poor prognosis. Plans to continue current care, with no escalation. TF not planned at this time. Palliative Care Team has been consulted. No nutrition intervention indicated at this time. Please consult if TF planned or other nutrition concerns arise.  Joaquin CourtsKimberly Harris, RD, LDN, CNSC Pager [phone] After Hours Pager [phone]

## 2016-10-26 NOTE — Progress Notes (Signed)
eLink Physician-Brief Progress Note Patient Name: Dwan BoltJatina W Nearhood DOB: 05/31/1965 MRN: [id number]   Date of Service  10/26/2016  HPI/Events of Note  Ca++ = 5.8 and albumin = 1.6. Ca++ corrrects to 7.72.  eICU Interventions  Will replace Ca++.     Intervention Category Major Interventions: Electrolyte abnormality - evaluation and management  Nizhoni Parlow Eugene 10/26/2016, 8:10 PM

## 2016-10-26 NOTE — Progress Notes (Signed)
Tube holder changed per patients family request.

## 2016-10-26 NOTE — Consult Note (Signed)
Consultation Note Date: 10/26/2016   Patient Name: Mallory Medina  DOB: [birth year]  MRN: [id number]  Age / Sex: 51 y.o., female  PCP: Patient, No Pcp Per Referring Physician: Javier Glazier, MD  Reason for Consultation: Psychosocial/spiritual support, Terminal Care and Withdrawal of life-sustaining treatment  HPI/Patient Profile: 51 y.o. female  with past medical history of Chronic pain, COPD, diabetes, hepatitis C, hypertension, remote use of IV drugs, history of Tylenol overdose approximately 10 years ago admitted on 10/28/2016 with lethargy, hypotension. When EMS arrived at her home oxygen sats were 78% on room air. Upon arrival to the emergency room, blood pressure was 97/86, white blood cell count 18.7, lactic acid level was 16.58. She was given 3 L of normal saline and her mental status began to improve. Patient had been so ill, she had been bed bound for the previous 3 days and had been experiencing nausea and vomiting and diarrhea during this time as well. Critical care medicine admitted patient. Later, patient arrested in the emergency room. She is currently intubated, on pressors.. She is on a continuous fentanyl infusion, Versed 1-4 mg as needed, Levophed, vaspressin, and Giapreza which will have to be DC'd 11/01/2016  Clinical Assessment and Goals of Care: Pt's brother, daughter as well as "adopted daughters" at the bedside.  Dr. Ashok Cordia has had multiple conversations with family and they recognize how critically ill she is. She is a partial code, intubation only (she is currently intubated). Patient has become hypotensive despite being on 3 pressors, and Giapreza as a was started but will have to be discontinued on 2016-11-01. Per Dr. Ashok Cordia with critical care medicine, family is in agreement that patient would not want to live on life support. It is not clear at this point whether we will be pursuing a one way  extubation or she could pass away on the vent tonight or once Darryl Nestle is stopped  Patient has 3 daughters who are involved in her care and we make making decisions together as a family. Her brother also has been a source of support and been at the bedside    SUMMARY OF RECOMMENDATIONS   Continue partial code which includes intubation only; no CPR, no defibrillation, no further advancement of pressors Palliative medicine provider to follow-up with family tomorrow. Questionable one way extubation versus patient is critically ill enough she may pass away on the vent tonight Code Status/Advance Care Planning:  Limited code    Symptom Management:   Pain: Continue fentanyl continuous infusion as directed by CCM  Palliative Prophylaxis:   Aspiration, Bowel Regimen, Delirium Protocol, Eye Care, Frequent Pain Assessment, Oral Care and Turn Reposition  Additional Recommendations (Limitations, Scope, Preferences):  No Chemotherapy, No Hemodialysis, No Radiation, No Surgical Procedures and No Tracheostomy  Psycho-social/Spiritual:   Desire for further Chaplaincy support:no  Additional Recommendations: Grief/Bereavement Support  Prognosis:   Hours - Days  Discharge Planning: Anticipated Hospital Death      Primary Diagnoses: Present on Admission: . Sepsis ([place])   I have reviewed the medical  record, interviewed the patient and family, and examined the patient. The following aspects are pertinent.  Past Medical History:  Diagnosis Date  . Back pain, chronic   . COPD (chronic obstructive pulmonary disease) ([place])   . Diabetes mellitus without complication ([place])    Type II  . Hepatitis C   . Hypertension   . IV drug abuse recovery   hx of   Social History   Social History  . Marital status: Divorced    Spouse name: N/A  . Number of children: N/A  . Years of education: N/A   Social History Main Topics  . Smoking status: Current Every Day Smoker    Packs/day: 1.00     Types: Cigarettes  . Smokeless tobacco: Never Used  . Alcohol use No  . Drug use: Yes    Types: Marijuana     Comment: occ  . Sexual activity: Yes    Birth control/ protection: Condom   Other Topics Concern  . None   Social History Narrative  . None   Family History  Problem Relation Age of Onset  . Diabetes Mellitus II Mother    Scheduled Meds: . budesonide (PULMICORT) nebulizer solution  0.5 mg Nebulization BID  . chlorhexidine gluconate (MEDLINE KIT)  15 mL Mouth Rinse BID  . Chlorhexidine Gluconate Cloth  6 each Topical Daily  . heparin subcutaneous  5,000 Units Subcutaneous Q8H  . ipratropium-albuterol  3 mL Nebulization Q6H  . mouth rinse  15 mL Mouth Rinse QID  . pantoprazole (PROTONIX) IV  40 mg Intravenous Q12H  . sodium chloride flush  10-40 mL Intracatheter Q12H   Continuous Infusions: . angiotensin II (GIAPREZA) infusion 40 ng/kg/min (10/26/16 1300)  . anidulafungin Stopped (10/26/16 1116)  . fentaNYL infusion INTRAVENOUS 400 mcg/hr (10/26/16 1300)  . norepinephrine (LEVOPHED) Adult infusion 13.973 mcg/min (10/26/16 1300)  . phenylephrine (NEO-SYNEPHRINE) Adult infusion Stopped (10/25/16 1008)  . piperacillin-tazobactam (ZOSYN)  IV 2.25 g (10/26/16 1434)  .  sodium bicarbonate  infusion 1000 mL 100 mL/hr at 10/26/16 1300  . vasopressin (PITRESSIN) infusion - *FOR SHOCK* 0.03 Units/min (10/26/16 1300)   PRN Meds:.fentaNYL, midazolam, sodium chloride flush Medications Prior to Admission:  Prior to Admission medications   Medication Sig Start Date End Date Taking? Authorizing Provider  diphenhydramine-acetaminophen (TYLENOL PM) 25-500 MG TABS tablet Take 2 tablets by mouth at bedtime as needed (sleep).    Yes [provider]  METHADONE HCL PO Take 120 mg by mouth daily. liquid   Yes [provider]  predniSONE (DELTASONE) 20 MG tablet Take 2 tablets (40 mg total) by mouth daily with breakfast. For the next four days 10/20/16  Yes Carmin Muskrat,  MD  albuterol (PROVENTIL HFA;VENTOLIN HFA) 108 (90 BASE) MCG/ACT inhaler Inhale 2 puffs into the lungs every 6 (six) hours as needed for wheezing. Patient not taking: Reported on 07/07/2016 12/01/12   Janece Canterbury, MD  albuterol (PROVENTIL) (2.5 MG/3ML) 0.083% nebulizer solution Take 3 mLs (2.5 mg total) by nebulization every 6 (six) hours as needed for wheezing or shortness of breath. Patient not taking: Reported on 09/15/2016 12/01/12   Janece Canterbury, MD  beclomethasone (QVAR) 40 MCG/ACT inhaler Inhale 2 puffs into the lungs 2 (two) times daily. Patient not taking: Reported on 03/17/2015 12/01/12   Janece Canterbury, MD  DULoxetine (CYMBALTA) 20 MG capsule Take 1 capsule (20 mg total) by mouth daily. Patient not taking: Reported on 03/17/2015 12/01/12   Janece Canterbury, MD  ipratropium (ATROVENT) 0.02 % nebulizer solution Take  2.5 mLs (500 mcg total) by nebulization 4 (four) times daily. Patient not taking: Reported on 10/20/2015 12/01/12   Janece Canterbury, MD  methocarbamol (ROBAXIN) 500 MG tablet Take 1 tablet (500 mg total) by mouth every 8 (eight) hours as needed for muscle spasms. Patient not taking: Reported on 07/07/2016 10/24/15   Street, Tennyson, PA-C  oxyCODONE-acetaminophen (PERCOCET) 5-325 MG tablet Take 1-2 tablets by mouth every 4 (four) hours as needed. Patient not taking: Reported on 07/07/2016 10/20/15   Margarita Mail, PA-C   Allergies  Allergen Reactions  . Sulfa Antibiotics Nausea And Vomiting    Stomach ache   Review of Systems  Unable to perform ROS: Intubated    Physical Exam  Constitutional: She appears well-developed and well-nourished.  Critically ill older female; intubated  Cardiovascular:  On pressors  Pulmonary/Chest:  Intubated  Genitourinary:  Genitourinary Comments: Foley; rectal tube  Neurological:  Unresponsive  Skin:  Feet mottled   Psychiatric:  Unable to test  Nursing note and vitals reviewed.   Vital Signs: BP (!) 104/53   Pulse (!) 131    Temp (!) 102.4 F (39.1 C)   Resp (!) 31   Ht 5' 3"  (1.6 m)   Wt 101 kg (222 lb 10.6 oz)   SpO2 100%   BMI 39.44 kg/m  Pain Assessment: CPOT   Pain Score: Asleep   SpO2: SpO2: 100 % O2 Device:SpO2: 100 % O2 Flow Rate: .   IO: Intake/output summary:  Intake/Output Summary (Last 24 hours) at 10/26/16 1719 Last data filed at 10/26/16 1300  Gross per 24 hour  Intake          5313.77 ml  Output              194 ml  Net          5119.77 ml    LBM: Last BM Date:  (not known) Baseline Weight: Weight: 79.4 kg (175 lb 0.7 oz) Most recent weight: Weight: 101 kg (222 lb 10.6 oz)     Palliative Assessment/Data:   Flowsheet Rows     Most Recent Value  Intake Tab  Referral Department  Critical care  Unit at Time of Referral  ICU  Palliative Care Primary Diagnosis  Sepsis/Infectious Disease  Date Notified  10/26/16  Palliative Care Type  New Palliative care  Reason for referral  Clarify Goals of Care, Psychosocial or Spiritual support, End of Life Care Assistance  Date of Admission  11/05/2016  Date first seen by Palliative Care  10/26/16  # of days Palliative referral response time  0 Day(s)  # of days IP prior to Palliative referral  3  Clinical Assessment  Palliative Performance Scale Score  20%  Pain Max last 24 hours  Not able to report  Pain Min Last 24 hours  Not able to report  Dyspnea Max Last 24 Hours  Not able to report  Dyspnea Min Last 24 hours  Not able to report  Nausea Max Last 24 Hours  Not able to report  Nausea Min Last 24 Hours  Not able to report  Anxiety Max Last 24 Hours  Not able to report  Anxiety Min Last 24 Hours  Not able to report  Other Max Last 24 Hours  Not able to report  Psychosocial & Spiritual Assessment  Palliative Care Outcomes  Patient/Family meeting held?  Yes  Who was at the meeting?  brother, daughter, 2 adopted daughers  Palliative Care Outcomes  Provided psychosocial or spiritual support  Patient/Family wishes: Interventions  discontinued/not started   PEG, Trach  Palliative Care follow-up planned  Yes, Facility      Time In: 1620 Time Out: 1735 Time Total: 75 min Greater than 50%  of this time was spent counseling and coordinating care related to the above assessment and plan. Staffed with Dr. Ashok Cordia  Signed by: Dory Horn, NP   Please contact Palliative Medicine Team phone at [phone] for questions and concerns.  For individual provider: See Shea Evans

## 2016-10-26 NOTE — Progress Notes (Addendum)
Pharmacy Antibiotic Note  Mallory Medina is a 51 y.o. female admitted on 10/19/2016 with sepsis. Pharmacy consulted for zosyn, eraxis and vancomycin dosing. Discontinued vancomycin this morning due to patient worsening renal function. Ordered a vancomycin random level for this evening and will re-dose vancomycin as needed.   Plan:  Re-dose vancomycin based on levels and renal function.   Height: 5\' 3"  (160 cm) Weight: 222 lb 10.6 oz (101 kg) IBW/kg (Calculated) : 52.4  Temp (24hrs), Avg:101.9 F (38.8 C), Min:99.9 F (37.7 C), Max:103.1 F (39.5 C)   Recent Labs Lab 04-13-16 2338 10/24/16 0448 10/24/16 0844 10/24/16 1345 10/24/16 1729 10/24/16 2116 10/25/16 0117 10/25/16 0127 10/25/16 1512 10/25/16 1620 10/26/16 0319  WBC 14.9* 15.6*  --   --  32.2*  --   --  40.3*  --  32.9* 31.6*  CREATININE 2.33* 2.36*  --   --   --   --   --  3.44* 3.75*  --  4.09*  LATICACIDVEN 10.4*  --  9.8* 9.3*  --  13.5* 14.1*  --   --   --  9.0*    Estimated Creatinine Clearance: 18.4 mL/min (A) (by C-G formula based on SCr of 4.09 mg/dL (H)).    Allergies  Allergen Reactions  . Sulfa Antibiotics Nausea And Vomiting    Stomach ache    Antimicrobials this admission:  Vanc 8/7>> Zosyn 8/7>> Vanc PO 8/7>>8/8 Cefazolin 8/8>8/8     Microbiology results: 08/07 Blood: MSSA   08/07 Urine: pending  08/07: MRSA PCR: neg 08/07 C. Diff: pending 08/07 GI panel: pending  Thank you for allowing pharmacy to be a part of this patient's care.  Blake DivineShannon Arlina Sabina, Pharm.D. PGY1 Pharmacy Resident 10/26/2016 8:22 AM Main Pharmacy: [phone]

## 2016-10-26 NOTE — Progress Notes (Signed)
Visitor at bedside claiming to be pts youngest daughter Lane Hacker(Harley). She stated that she does not speak with other sisters and uncle who set up security password. She did call her uncle Renae Fickle(Paul) to get password in order to have information on pt status but was denied. Explained to her the situation regarding need for password to give details on pt condition. Informed her that we will discuss with primary team during morning rounds how to get her involved in decision making or at least information on pts condition. Number to unit given to visitor to call tomorrow and follow up with nurse or charge nurse.

## 2016-10-26 NOTE — Progress Notes (Signed)
[facility] for Infectious Disease  Date of Admission:  11/10/2016   Total days of antibiotics 4        Day 4 Vancomycin        Day 4 Zosyn        Day 2 Anidulafungin ASSESSMENT and PLAN:  MSSA Bacteremia Sepsis The patient's condition continues to deteriorate. It is uncertain whether patient has vegetation in heart due to her poor quality TTE. She likely has septic emboli in her lungs.  Patient biofire result was positive for MSSA in the blood. Likely 2/2 IVDU  -Pending blood culture collected 8/7 shows staphylococcus aureus, pending final result -Fungal culture collected 8/9 shows no growth 1 day -Blood culture collected 8/9 shows no growth 1 day -Respiratory culture collected 8/8 is positive for moderate yeast and rare mold -Urine culture no growth 8/9 -Palliative care consulted to discuss goals of care -Vancomycin stopped due to renal function 4.09 (8/10) -Continue zosyn and eraxis    . budesonide (PULMICORT) nebulizer solution  0.5 mg Nebulization BID  . chlorhexidine gluconate (MEDLINE KIT)  15 mL Mouth Rinse BID  . Chlorhexidine Gluconate Cloth  6 each Topical Daily  . ipratropium-albuterol  3 mL Nebulization Q6H  . mouth rinse  15 mL Mouth Rinse QID  . pantoprazole (PROTONIX) IV  40 mg Intravenous Q12H  . sodium chloride flush  10-40 mL Intracatheter Q12H    SUBJECTIVE: Ms. Houchin was seen laying in her bed on ventilator. She was sedated. Her brother and sister-in-law were at bedside. Family members expressed interest in speaking with the palliative care team.   Review of Systems: ROS as above  Allergies  Allergen Reactions  . Sulfa Antibiotics Nausea And Vomiting    Stomach ache    OBJECTIVE: Vitals:   10/26/16 0400 10/26/16 0500 10/26/16 0600 10/26/16 0716  BP: 104/75 (!) 105/93 96/66 (!) 206/81  Pulse: (!) 132 (!) 131 (!) 131 (!) 130  Resp:    (!) 35  Temp: (!) 101.8 F (38.8 C) (!) 102 F (38.9 C) (!) 101.7 F (38.7 C)   TempSrc:        SpO2: 97% 96% 98% 97%  Weight:  222 lb 10.6 oz (101 kg)    Height:       Body mass index is 39.44 kg/m.  Physical Exam  Constitutional: She appears unhealthy. She has a sickly appearance.  Cardiovascular: Normal rate, regular rhythm, normal heart sounds and intact distal pulses.   Pulmonary/Chest: She has rales.  Abdominal: She exhibits distension.  Musculoskeletal: She exhibits no edema.  Neurological:  sedated  Skin: Ecchymosis (large ecchymosis on right back and newly noted on the lower abdomen per today's exam) noted.  Large blisters noted on right lower abdomen  Bilateral feet mottled     Lab Results Lab Results  Component Value Date   WBC 31.6 (H) 10/26/2016   HGB 9.7 (L) 10/26/2016   HCT 28.5 (L) 10/26/2016   MCV 87.7 10/26/2016   PLT 81 (L) 10/26/2016    Lab Results  Component Value Date   CREATININE 4.09 (H) 10/26/2016   BUN 46 (H) 10/26/2016   NA 129 (L) 10/26/2016   K 4.7 10/26/2016   CL 80 (L) 10/26/2016   CO2 26 10/26/2016    Lab Results  Component Value Date   ALT 199 (H) 10/26/2016   AST 1,478 (H) 10/26/2016   ALKPHOS 141 (H) 10/26/2016   BILITOT 3.9 (H) 10/26/2016  Microbiology: Recent Results (from the past 240 hour(s))  Culture, blood (routine x 2)     Status: Abnormal (Preliminary result)   Collection Time: 11/06/2016  4:15 PM  Result Value Ref Range Status   Specimen Description BLOOD  Final   Special Requests IN PEDIATRIC BOTTLE Blood Culture adequate volume  Final   Culture  Setup Time   Final    GRAM POSITIVE COCCI IN CLUSTERS IN PEDIATRIC BOTTLE CRITICAL RESULT CALLED TO, READ BACK BY AND VERIFIED WITH: Ellin Mayhew Penermon [id number] MLM    Culture (A)  Final    STAPHYLOCOCCUS AUREUS SUSCEPTIBILITIES TO FOLLOW    Report Status PENDING  Incomplete  Blood Culture ID Panel (Reflexed)     Status: Abnormal   Collection Time: 10/22/2016  4:15 PM  Result Value Ref Range Status   Enterococcus species NOT DETECTED NOT DETECTED Final    Listeria monocytogenes NOT DETECTED NOT DETECTED Final   Staphylococcus species DETECTED (A) NOT DETECTED Final    Comment: CRITICAL RESULT CALLED TO, READ BACK BY AND VERIFIED WITH: Ellin Mayhew MACCIA [id number] MLM    Staphylococcus aureus DETECTED (A) NOT DETECTED Final    Comment: Methicillin (oxacillin) susceptible Staphylococcus aureus (MSSA). Preferred therapy is anti staphylococcal beta lactam antibiotic (Cefazolin or Nafcillin), unless clinically contraindicated. CRITICAL RESULT CALLED TO, READ BACK BY AND VERIFIED WITH: Ailene Rud [id number] MLM    Methicillin resistance NOT DETECTED NOT DETECTED Final   Streptococcus species NOT DETECTED NOT DETECTED Final   Streptococcus agalactiae NOT DETECTED NOT DETECTED Final   Streptococcus pneumoniae NOT DETECTED NOT DETECTED Final   Streptococcus pyogenes NOT DETECTED NOT DETECTED Final   Acinetobacter baumannii NOT DETECTED NOT DETECTED Final   Enterobacteriaceae species NOT DETECTED NOT DETECTED Final   Enterobacter cloacae complex NOT DETECTED NOT DETECTED Final   Escherichia coli NOT DETECTED NOT DETECTED Final   Klebsiella oxytoca NOT DETECTED NOT DETECTED Final   Klebsiella pneumoniae NOT DETECTED NOT DETECTED Final   Proteus species NOT DETECTED NOT DETECTED Final   Serratia marcescens NOT DETECTED NOT DETECTED Final   Haemophilus influenzae NOT DETECTED NOT DETECTED Final   Neisseria meningitidis NOT DETECTED NOT DETECTED Final   Pseudomonas aeruginosa NOT DETECTED NOT DETECTED Final   Candida albicans NOT DETECTED NOT DETECTED Final   Candida glabrata NOT DETECTED NOT DETECTED Final   Candida krusei NOT DETECTED NOT DETECTED Final   Candida parapsilosis NOT DETECTED NOT DETECTED Final   Candida tropicalis NOT DETECTED NOT DETECTED Final  Urine culture     Status: None   Collection Time: 10/25/2016  5:07 PM  Result Value Ref Range Status   Specimen Description URINE, CATHETERIZED  Final   Special Requests NONE  Final    Culture NO GROWTH  Final   Report Status 10/25/2016 FINAL  Final  MRSA PCR Screening     Status: None   Collection Time: 11/16/2016 10:45 PM  Result Value Ref Range Status   MRSA by PCR NEGATIVE NEGATIVE Final    Comment:        The GeneXpert MRSA Assay (FDA approved for NASAL specimens only), is one component of a comprehensive MRSA colonization surveillance program. It is not intended to diagnose MRSA infection nor to guide or monitor treatment for MRSA infections.   Culture, blood (routine x 2)     Status: Abnormal (Preliminary result)   Collection Time: 10/25/2016 11:10 PM  Result Value Ref Range Status   Specimen Description BLOOD LEFT HAND  Final  Special Requests IN PEDIATRIC BOTTLE Blood Culture adequate volume  Final   Culture  Setup Time   Final    GRAM POSITIVE COCCI IN CLUSTERS IN PEDIATRIC BOTTLE CRITICAL VALUE NOTED.  VALUE IS CONSISTENT WITH PREVIOUSLY REPORTED AND CALLED VALUE.    Culture (A)  Final    STAPHYLOCOCCUS AUREUS SUSCEPTIBILITIES TO FOLLOW    Report Status PENDING  Incomplete  Culture, respiratory (NON-Expectorated)     Status: None (Preliminary result)   Collection Time: 10/24/16  3:23 PM  Result Value Ref Range Status   Specimen Description TRACHEAL ASPIRATE  Final   Special Requests NONE  Final   Gram Stain   Final    RARE WBC PRESENT,BOTH PMN AND MONONUCLEAR MODERATE YEAST    Culture TOO YOUNG TO READ  Final   Report Status PENDING  Incomplete  Fungus culture, blood     Status: None (Preliminary result)   Collection Time: 10/25/16  4:16 AM  Result Value Ref Range Status   Specimen Description BLOOD RIGHT FOOT  Final   Special Requests   Final    BOTTLES DRAWN AEROBIC ONLY Blood Culture adequate volume   Culture PENDING  Incomplete   Report Status PENDING  Incomplete    Lars Mage, MD [facility] [phone] pager   [phone] cell 10/26/2016, 8:13 AM

## 2016-10-27 DIAGNOSIS — R740 Nonspecific elevation of levels of transaminase and lactic acid dehydrogenase [LDH]: Secondary | ICD-10-CM

## 2016-10-27 DIAGNOSIS — B49 Unspecified mycosis: Secondary | ICD-10-CM

## 2016-10-27 DIAGNOSIS — N179 Acute kidney failure, unspecified: Secondary | ICD-10-CM

## 2016-10-27 DIAGNOSIS — R7401 Elevation of levels of liver transaminase levels: Secondary | ICD-10-CM

## 2016-10-27 DIAGNOSIS — A4101 Sepsis due to Methicillin susceptible Staphylococcus aureus: Principal | ICD-10-CM

## 2016-10-27 LAB — CBC WITH DIFFERENTIAL/PLATELET
BAND NEUTROPHILS: 0 %
BASOS ABS: 0 10*3/uL (ref 0.0–0.1)
BASOS PCT: 0 %
BASOS PCT: 0 %
Band Neutrophils: 0 %
Basophils Absolute: 0 10*3/uL (ref 0.0–0.1)
Blasts: 0 %
Blasts: 0 %
EOS ABS: 0.3 10*3/uL (ref 0.0–0.7)
EOS ABS: 0.4 10*3/uL (ref 0.0–0.7)
EOS PCT: 1 %
Eosinophils Relative: 1 %
HCT: 26.6 % — ABNORMAL LOW (ref 36.0–46.0)
HCT: 26.6 % — ABNORMAL LOW (ref 36.0–46.0)
HEMOGLOBIN: 9 g/dL — AB (ref 12.0–15.0)
Hemoglobin: 9 g/dL — ABNORMAL LOW (ref 12.0–15.0)
LYMPHS ABS: 1.8 10*3/uL (ref 0.7–4.0)
LYMPHS PCT: 9 %
LYMPHS PCT: 5 %
Lymphs Abs: 3 10*3/uL (ref 0.7–4.0)
MCH: 30.3 pg (ref 26.0–34.0)
MCH: 30.3 pg (ref 26.0–34.0)
MCHC: 33.8 g/dL (ref 30.0–36.0)
MCHC: 33.8 g/dL (ref 30.0–36.0)
MCV: 89.6 fL (ref 78.0–100.0)
MCV: 89.6 fL (ref 78.0–100.0)
MONO ABS: 1.8 10*3/uL — AB (ref 0.1–1.0)
MONOS PCT: 5 %
Metamyelocytes Relative: 0 %
Metamyelocytes Relative: 0 %
Monocytes Absolute: 1.7 10*3/uL — ABNORMAL HIGH (ref 0.1–1.0)
Monocytes Relative: 5 %
Myelocytes: 0 %
Myelocytes: 0 %
NEUTROS ABS: 28.7 10*3/uL — AB (ref 1.7–7.7)
NEUTROS ABS: 31.8 10*3/uL — AB (ref 1.7–7.7)
NEUTROS PCT: 89 %
NRBC: 0 /100{WBCs}
NRBC: 0 /100{WBCs}
Neutrophils Relative %: 85 %
PLATELETS: 81 10*3/uL — AB (ref 150–400)
PROMYELOCYTES ABS: 0 %
PROMYELOCYTES ABS: 0 %
Platelets: 70 10*3/uL — ABNORMAL LOW (ref 150–400)
RBC: 2.97 MIL/uL — ABNORMAL LOW (ref 3.87–5.11)
RBC: 2.97 MIL/uL — ABNORMAL LOW (ref 3.87–5.11)
RDW: 15.1 % (ref 11.5–15.5)
RDW: 15.1 % (ref 11.5–15.5)
WBC: 33.7 10*3/uL — ABNORMAL HIGH (ref 4.0–10.5)
WBC: 35.8 10*3/uL — ABNORMAL HIGH (ref 4.0–10.5)

## 2016-10-27 LAB — PROTIME-INR
INR: >10
INR: >10
Prothrombin Time: >90 seconds — ABNORMAL HIGH (ref 11.4–15.2)
Prothrombin Time: >90 seconds — ABNORMAL HIGH (ref 11.4–15.2)

## 2016-10-27 LAB — COMPREHENSIVE METABOLIC PANEL
ALBUMIN: 1.3 g/dL — AB (ref 3.5–5.0)
ALT: 150 U/L — ABNORMAL HIGH (ref 14–54)
ALT: 94 U/L — ABNORMAL HIGH (ref 14–54)
ANION GAP: 22 — AB (ref 5–15)
ANION GAP: 24 — AB (ref 5–15)
AST: 1437 U/L — ABNORMAL HIGH (ref 15–41)
AST: 1021 U/L — ABNORMAL HIGH (ref 15–41)
Albumin: 1.3 g/dL — ABNORMAL LOW (ref 3.5–5.0)
Alkaline Phosphatase: 172 U/L — ABNORMAL HIGH (ref 38–126)
Alkaline Phosphatase: 155 U/L — ABNORMAL HIGH (ref 38–126)
BILIRUBIN TOTAL: 4.1 mg/dL — AB (ref 0.3–1.2)
BUN: 56 mg/dL — ABNORMAL HIGH (ref 6–20)
BUN: 63 mg/dL — ABNORMAL HIGH (ref 6–20)
CALCIUM: 5.9 mg/dL — AB (ref 8.9–10.3)
CHLORIDE: 80 mmol/L — AB (ref 101–111)
CO2: 28 mmol/L (ref 22–32)
CO2: 25 mmol/L (ref 22–32)
CREATININE: 4.53 mg/dL — AB (ref 0.44–1.00)
Calcium: 5.9 mg/dL — CL (ref 8.9–10.3)
Chloride: 78 mmol/L — ABNORMAL LOW (ref 101–111)
Creatinine, Ser: 4.72 mg/dL — ABNORMAL HIGH (ref 0.44–1.00)
GFR calc Af Amer: 12 mL/min — ABNORMAL LOW (ref >60)
GFR calc non Af Amer: 10 mL/min — ABNORMAL LOW (ref >60)
GFR, EST AFRICAN AMERICAN: 11 mL/min — AB (ref >60)
GFR, EST NON AFRICAN AMERICAN: 10 mL/min — AB (ref >60)
GLUCOSE: 167 mg/dL — AB (ref 65–99)
GLUCOSE: 102 mg/dL — AB (ref 65–99)
POTASSIUM: 4.9 mmol/L (ref 3.5–5.1)
POTASSIUM: 5.4 mmol/L — AB (ref 3.5–5.1)
SODIUM: 127 mmol/L — AB (ref 135–145)
Sodium: 130 mmol/L — ABNORMAL LOW (ref 135–145)
Total Bilirubin: 4 mg/dL — ABNORMAL HIGH (ref 0.3–1.2)
Total Protein: 3.8 g/dL — ABNORMAL LOW (ref 6.5–8.1)
Total Protein: 4.2 g/dL — ABNORMAL LOW (ref 6.5–8.1)

## 2016-10-27 LAB — BLOOD CULTURE ID PANEL (REFLEXED)
ACINETOBACTER BAUMANNII: NOT DETECTED
CANDIDA ALBICANS: DETECTED — AB
CANDIDA GLABRATA: NOT DETECTED
CANDIDA KRUSEI: NOT DETECTED
CANDIDA TROPICALIS: NOT DETECTED
Candida parapsilosis: NOT DETECTED
Carbapenem resistance: NOT DETECTED
ENTEROBACTER CLOACAE COMPLEX: NOT DETECTED
ENTEROCOCCUS SPECIES: NOT DETECTED
ESCHERICHIA COLI: NOT DETECTED
Enterobacteriaceae species: NOT DETECTED
HAEMOPHILUS INFLUENZAE: NOT DETECTED
Klebsiella oxytoca: NOT DETECTED
Klebsiella pneumoniae: NOT DETECTED
Listeria monocytogenes: NOT DETECTED
METHICILLIN RESISTANCE: NOT DETECTED
NEISSERIA MENINGITIDIS: NOT DETECTED
PROTEUS SPECIES: NOT DETECTED
Pseudomonas aeruginosa: NOT DETECTED
SERRATIA MARCESCENS: NOT DETECTED
STREPTOCOCCUS PNEUMONIAE: NOT DETECTED
STREPTOCOCCUS PYOGENES: NOT DETECTED
STREPTOCOCCUS SPECIES: NOT DETECTED
Staphylococcus aureus (BCID): NOT DETECTED
Staphylococcus species: NOT DETECTED
Streptococcus agalactiae: NOT DETECTED
VANCOMYCIN RESISTANCE: NOT DETECTED

## 2016-10-27 LAB — MAGNESIUM: MAGNESIUM: 1.7 mg/dL (ref 1.7–2.4)

## 2016-10-27 LAB — LACTIC ACID, PLASMA: Lactic Acid, Venous: 9.2 mmol/L (ref 0.5–1.9)

## 2016-10-27 LAB — GLUCOSE, CAPILLARY
GLUCOSE-CAPILLARY: 157 mg/dL — AB (ref 65–99)
GLUCOSE-CAPILLARY: 163 mg/dL — AB (ref 65–99)
GLUCOSE-CAPILLARY: 72 mg/dL (ref 65–99)
Glucose-Capillary: 50 mg/dL — ABNORMAL LOW (ref 65–99)
Glucose-Capillary: 120 mg/dL — ABNORMAL HIGH (ref 65–99)

## 2016-10-27 LAB — PHOSPHORUS: PHOSPHORUS: 7.6 mg/dL — AB (ref 2.5–4.6)

## 2016-10-27 LAB — CALCIUM, IONIZED: Calcium, Ionized, Serum: 3 mg/dL — ABNORMAL LOW (ref 4.5–5.6)

## 2016-10-27 MED ORDER — MIDAZOLAM BOLUS VIA INFUSION (WITHDRAWAL LIFE SUSTAINING TX)
5.0000 mg | INTRAVENOUS | Status: DC | PRN
  Filled 2016-10-27: qty 20

## 2016-10-27 MED ORDER — FENTANYL BOLUS VIA INFUSION
50.0000 ug | INTRAVENOUS | Status: DC | PRN
  Filled 2016-10-27: qty 200

## 2016-10-27 MED ORDER — DEXTROSE 50 % IV SOLN
50.0000 mL | Freq: Once | INTRAVENOUS | Status: AC
  Administered 2016-10-27: 50 mL via INTRAVENOUS
  Filled 2016-10-27: qty 50

## 2016-10-27 MED ORDER — FENTANYL 2500MCG IN NS 250ML (10MCG/ML) PREMIX INFUSION
100.0000 ug/h | INTRAVENOUS | Status: DC
  Administered 2016-10-27: 100 ug/h via INTRAVENOUS

## 2016-10-27 MED ORDER — SODIUM CHLORIDE 0.9 % IV SOLN
10.0000 mg/h | INTRAVENOUS | Status: DC
  Administered 2016-10-27: 10 mg/h via INTRAVENOUS
  Filled 2016-10-27: qty 10

## 2016-10-28 LAB — CULTURE, BLOOD (ROUTINE X 2): SPECIAL REQUESTS: ADEQUATE

## 2016-10-28 LAB — CULTURE, RESPIRATORY W GRAM STAIN

## 2016-10-28 LAB — CULTURE, RESPIRATORY

## 2016-10-28 MED FILL — Medication: Qty: 1 | Status: AC

## 2016-10-29 LAB — CULTURE, BLOOD (ROUTINE X 2): Special Requests: ADEQUATE

## 2016-10-29 LAB — GLUCOSE, CAPILLARY: Glucose-Capillary: 30 mg/dL — CL (ref 65–99)

## 2016-10-29 LAB — BLOOD GAS, ARTERIAL
ACID-BASE EXCESS: 3.4 mmol/L — AB (ref 0.0–2.0)
BICARBONATE: 27.5 mmol/L (ref 20.0–28.0)
Drawn by: 252031
FIO2: 50
MECHVT: 420 mL
O2 Saturation: 93.4 %
PATIENT TEMPERATURE: 98.6
PCO2 ART: 42.1 mmHg (ref 32.0–48.0)
PEEP/CPAP: 14 cmH2O
PH ART: 7.43 (ref 7.350–7.450)
PO2 ART: 71.4 mmHg — AB (ref 83.0–108.0)
RATE: 30 resp/min

## 2016-11-01 ENCOUNTER — Telehealth: Payer: Self-pay

## 2016-11-01 LAB — FUNGUS CULTURE, BLOOD
Culture: NO GROWTH
SPECIAL REQUESTS: ADEQUATE

## 2016-11-01 LAB — CULTURE, BLOOD (ROUTINE X 2)
Culture: NO GROWTH
SPECIAL REQUESTS: ADEQUATE

## 2016-11-01 NOTE — Telephone Encounter (Signed)
On 11/01/16 I [facility] The death certificate is for cremation. The patient is a patient of [employer], [employer] [facility]. [facility] this pm for signature.   On 11/01/2016 I received the death certificate back from [facility]. I got the death certificate ready and called the funeral home to let them know the death certificate is ready for pickup. I also faxed a copy to the funeral home per the funeral home request.

## 2016-11-17 NOTE — Progress Notes (Signed)
Name: Mallory Medina MRN: [id number] DOB: 07/25/1965    ADMISSION DATE:  Oct 28, 2016 CONSULTATION DATE:  10-28-16  REFERRING MD :  Dr. Madilyn Hook   CHIEF COMPLAINT:  Hypotension/Sepsis   HISTORY OF PRESENT ILLNESS:  51 y.o. female current smoker with PMH of Chronic Pain Back, COPD, DM, Hepatitis C, HTN, IV drug abuse, non-compliant with medications due to financial restraints. Presented to ED on 8/7 with lethargy and hypotension. Upon EMS arrival oxygen saturation 78% on room air. Upon arrival to ED BP 97/86, WBC 18.7, LA 16.58, ABG 7.1/30.1/241. Given 3L NS. Mental Status began to improve. Patient stated that she has a chronic productive cough with black to yellow sputum, chronic dyspnea, several days of burning will urinating, and generalized pain/fatiuge. Has been bed bound for the last 3 days, reports N/V/D during this time as well. PCCM asked to admit. Presented to ED on 8/4 with upper back pain with dyspnea and a productive cough for 3 says. CXR not revealing of PNA, discharged with bronchodilators, steroids, and azithromycin.  SUBJECTIVE:  Patient hypoglycemic overnight & given D10. Remains on multiple pressors.   REVIEW OF SYSTEMS:  Unable to obtain given altered mentation & intubation.   VITAL SIGNS: Temp:  [99.8 F (37.7 C)-103.1 F (39.5 C)] 100.6 F (38.1 C) (08/11 0700) Pulse Rate:  [107-132] 124 (08/11 0717) Resp:  [31-35] 31 (08/11 0717) BP: (87-133)/(53-94) 123/84 (08/11 0717) SpO2:  [93 %-100 %] 99 % (08/11 0717) Arterial Line BP: (88-133)/(53-85) 115/68 (08/11 0700) FiO2 (%):  [50 %-60 %] 60 % (08/11 0723) Weight:  [234 lb 2.1 oz (106.2 kg)] 234 lb 2.1 oz (106.2 kg) (08/11 0500)  PHYSICAL EXAMINATION: General:  No family at bedside. No distress. Integument:  Continued mottling of extremities. Increasing bruising over her upper back and bilateral flanks with some bullae formation and minimal surrounding erythema. HEENT:  Endotracheal tube in place. Moist mucous  membranes. Cardiovascular:  Worsening anasarca. Regular rhythm. Pulmonary:  Distant breath sounds. Overall clear with auscultation. Abdomen: Hypoactive bowel sounds. Protuberant. Soft. Neurological:  No spontaneous movements. Sedated.   Recent Labs Lab 10/26/16 0319 10/26/16 1802 11/16/2016 0430  NA 129* 128* 130*  K 4.7 4.8 4.9  CL 80* 79* 80*  CO2 26 27 28   BUN 46* 51* 56*  CREATININE 4.09* 4.37* 4.53*  GLUCOSE 147* 87 167*    Recent Labs Lab 10/26/16 0319 10/26/16 1802 11/10/2016 0430  HGB 9.7* 9.2* 9.0*  HCT 28.5* 27.1* 26.6*  WBC 31.6* 34.8* 33.7*  PLT 81* 80* 70*   No results found. STUDIES:  UDS 8/7:  Positive for THC CT HEAD W/O 8/7:  No acute findings by head CT. CTA CHEST 8/8: IMPRESSION: 1. Isolated acute subsegmental pulmonary embolism in the right lower lobe. 2. Well-positioned support structures. 3. Mild atelectasis in the dependent and basilar lungs bilaterally. 4. Anterior right fifth rib acute fracture. Multiple healing bilateral posterior rib fractures bilaterally. Stable T3 and T11 vertebral compression fractures. TTE 8/8:  Patient tachycardic therefore unable to give accurate assessment of EF. Overall left ventricular function appears globally reduced with normal cavity size. LA & RA normal in size. Questionable thrombus within RV with poor visualization. Cavity size of RV appeared normal with mild reduction in systolic function. Aortic valve poorly visualized but no obvious stenosis or regurgitation. Aortic root normal in size. No mitral stenosis or regurgitation. No pulmonic stenosis or regurgitation. No tricuspid regurgitation. Small free-flowing pericardial effusion at the apex. BILATERAL LE VENOUS DUPLEX 8/8 >>>  Preliminary negative  for DVT/SVT.  PORT CXR 8/8:  Previously reviewed by me. No focal opacity appreciated. No pleural effusion. Left internal jugular central venous catheter in good position. Endotracheal tube in good position. Enteric feeding  tube coursing below diaphragm. PORT ABD U/S COMPLETE 8/8: IMPRESSION: 1. Diffuse biliary sludge in the gallbladder lumen with at least one small calculus. No overt evidence of gallbladder inflammation or biliary obstruction. 2. Evidence of hepatic steatosis. 3. Mildly echogenic kidneys bilaterally without evidence of hydronephrosis or significant renal atrophy. Findings are suggestive of underlying chronic kidney disease.  MICROBIOLOGY: MRSA PCR 8/7:  Negative Blood Cultures x2 8/7:  2/2 Positive MSSA Urine Culture 8/7:  Negative  Blood Cultures x2 8/9 >>> 1/2 Positive Yeast Fungal Blood Culture 8/9 >>>  ANTIBIOTICS: Ancef 8/8 (x1 dose) Diflucan 8/9 (x1 dose) Vancomycin 8/7 - 8/10 Zosyn 8/7 >>>  Eraxis 8/9 >>>  SIGNIFICANT EVENTS:  8/07 - Presents to ED  8/08 - PEA arrest 8/09 - Heparin drip stopped w/ active bleeding from femoral arterial line & decreasing hemoglobin with blood loss  LINES/TUBES: R RAD ART LINE (out) L FEM ART LINE 8/9 >>> OETT 7.5 8/7 >>> L IJ CVL 8/7 >>> OGT 8/7 >>> Foley 8/7 >>> PIV  ASSESSMENT / PLAN:  PULMONARY A: Acute Hypoxic Respiratory Failure:  Improving.  Acute Pulmonary Embolism:  Right lower lobe. Anticoagulation stopped w/ active bleeding & worsening anemia 8/9.  Nondisplaced Rib Fractures:  Seen on chest imaging post resuscitation. History of COPD  P:   Full ventilator support Holding on SBT & pressure support wean  Weaning FiO2 for Sat >92% & PAO2 >55 Continuing Pulmicort neb BID & Duonebs q6hr Holding systemic anticoagluation  CARDIOVASCULAR A:  Shock:  Multifactorial from sepsis, blood loss, & PE.  Continuing to require 3 vasopressors. Tachycardia:  Multifactorial in etiology. RV Thrombus:  Questionable seen on sub-optimal TTE 8/8. H/O Essential hypertension Hepatic Steatosis:  Seen on abdominal U/S.   P:  Continuing to wean vasopressors for goal MAP >65 & SBP >90 Quadruple strength vasopressors Monitoring vitals per  unit protocol Continuing to monitor patient with telemetry Plan to repeat TTE vs TEE once patient stabilizes   RENAL A:   Acute Renal Failure:  Now anuric. Worsening.  Metabolic/Lactic Acidosis:  Stable on Bicarb drip. Hyponatremia:  Mild and stable.  Hypocalcemia:  Replaced with IV Calcium overnight.   P:   Continuing to hold on renal replacement therapy Decreasing bicarbonate drip to 50 mL per hour Continuing Foley catheter to monitor for urine output Continuing to trend electrolytes every 12 hours Continuing to trend lactic acid daily   GASTROINTESTINAL A:   Shock Liver:  Possible improvement today.  Coagulopathy:  Worsening. Likely due to worsening liver failure. GIB:  Coffee ground stomach contents. Reported Diarrhea:  Prior to arrival but non since admission.  P:   NPO Holding on tube feeding Protonix IV q12hr Trending LFTs q12hr  HEMATOLOGIC A:   Anemia:  Hemoglobin stable.Multifactorial from blood loss from line, bruising, & GI Bleeding. GI Bleed:  Appears upper. No evidence of active bleeding. Acute Pulmonary Embolism:  Off anticoagulation 8/9 w/ blood loss. RV Thrombus:  Questionable. Leukocytosis:  Likely secondary to sepsis. Improving. Coagulopathy:  Likely due to cirrhosis and acute liver failure. Worsening. ThrombocytoContinuing to worsen. Likely multifactorial with sepsis & consumption.   P:  Holding systemic anticoagulation  Continuing SCDs and heparin subcutaneous every 8 hours  Trending coags daily Trending cell counts q12hr Transfuse for Hgb <7.0  INFECTIOUS A:   MSSA  Bacteremia Sepsis H/O Hepatitis C Viral Infection   P:   Antibiotics as per ID recommendations Awaiting final culture results   ENDOCRINE A:   Hypoglycemia:  Recurred overnight. H/O Diabetes Mellitus   P:   Restarted D10 infusion Continuing d5 in bicarb drip Accu-checks q4hr with MD notification parameters  NEUROLOGIC A:   Acute Encephalopathy:  Multifactorial with  likely a component of anoxic injury post arrest and toxic metabolic. Sedation on Ventilator Analgesia for rib fractures Chronic Pain History of Overdose & Suicidal Ideation History of IVDU  P:   RASS goal: 0 to -1 Fentanyl gtt & IV prn pain Versed IV prn sedation  FAMILY  - Updates:  See plan of care note from 8/10.  - Inter-disciplinary family meet or Palliative Care meeting due by:  8/14  DISCUSSION:  51 y.o. female with multisystem organ failure. Dismal prognosis. Patient now with an uric renal failure. Case discussed with palliative medicine team and plan for family meeting to discuss goals of care as family previously reported she would not wish to undergo dialysis or aggressive measures of this nature.   I have spent a total of 33 minutes of critical care time today caring for the patient and reviewing the patient's electronic medical record.   Donna ChristenJennings E. Jamison NeighborNestor, M.D. [facility] Pager:  [phone] After 3pm or if no response, call [phone] 8:07 AM July 07, 2016

## 2016-11-17 NOTE — Procedures (Signed)
Extubation Procedure Note  Patient Details:   Name: Mallory Medina DOB: 01-Sep-1965 MRN: [id number]   Airway Documentation:     Evaluation  O2 sats: transiently fell during during procedure    Complications: No apparent complications Patient did tolerate procedure well. Bilateral Breath Sounds: Diminished   No   Pt was extubated per order.  No complications.    Berton BonBlanca E Limon Nunez 05-30-2016, 9:36 PM

## 2016-11-17 NOTE — Progress Notes (Signed)
eLink Physician-Brief Progress Note Patient Name: Mallory BoltJatina W Medina DOB: Oct 24, 1965 MRN: [id number]   Date of Service  Aug 20, 2016  HPI/Events of Note  Spoke with the patient's brother, Junious Silkaul Walker, who understands his sister's grave illness and very poor prognosis for survival. He states the he and the family are ready to move to comfort measures only and allow his sister to pass with comfort and dignity. Will continue Fentanyl and Versed IV infusions.   eICU Interventions  Will make patient comfort measures and D/C mechanical ventilation, vasopressor and other life sustaining therapy per Withdrawal of Life Sustaining Measures Protocol.      Intervention Category Major Interventions: End of life / care limitation discussion  Lenell AntuSommer,Steven Eugene Aug 20, 2016, 8:35 PM

## 2016-11-17 NOTE — Progress Notes (Signed)
170ml Fentanyl an 40ml Versed wasted in sink with Annabell Sabalourtney Wofford RN

## 2016-11-17 NOTE — Progress Notes (Signed)
Chaplain paged to support family for this patient who was being terminally extubated.  Several family members present.  Chaplain accompanied family to the waiting area where Chaplain had them share about their loved ones favorite things to do, her favorite color, her favorite songs.  Family found great meaning in this and even remembered some comical choice words that they said they could not say out loud but all found lots of laughter during this moment.  Chaplain present as patient passed and two daughters struggling with the situation around her (patient's) death and previous deaths in the family.  Chaplain would like to thank this patient's nurse and medical team for her care for this patient and this family during this time.    Oldest daughter Mallory Medina- Mallory Medina - [phone] Brother of patient - Mallory Medina - [phone]- [phone]  Please page on call Chaplain should additional assistance be needed.    10/26/2016 2205  Clinical Encounter Type  Visited With Family  Visit Type Initial;Psychological support;Spiritual support;Social support;Death;Patient actively dying  Referral From Nurse  Consult/Referral To Chaplain  Spiritual Encounters  Spiritual Needs Prayer;Ritual;Emotional

## 2016-11-17 NOTE — Progress Notes (Signed)
Family decided to terminally extubate. E-link notified. Chaplain called to see family. [facility] notified. Referral # [id number]

## 2016-11-17 NOTE — Progress Notes (Addendum)
Subjective:  Intubated and sedated  Antibiotics:  Anti-infectives    Start     Dose/Rate Route Frequency Ordered Stop   10/26/16 1400  piperacillin-tazobactam (ZOSYN) IVPB 2.25 g     2.25 g 100 mL/hr over 30 Minutes Intravenous Every 8 hours 10/26/16 0739     10/26/16 0830  anidulafungin (ERAXIS) 100 mg in sodium chloride 0.9 % 100 mL IVPB     100 mg 78 mL/hr over 100 Minutes Intravenous Every 24 hours 10/25/16 0820     10/26/16 0800  fluconazole (DIFLUCAN) IVPB 200 mg  Status:  Discontinued     200 mg 100 mL/hr over 60 Minutes Intravenous Every 24 hours 10/25/16 0240 10/25/16 0820   10/25/16 0830  anidulafungin (ERAXIS) 200 mg in sodium chloride 0.9 % 200 mL IVPB     200 mg 78 mL/hr over 200 Minutes Intravenous  Once 10/25/16 0820 10/25/16 1252   10/25/16 0330  fluconazole (DIFLUCAN) IVPB 400 mg     400 mg 100 mL/hr over 120 Minutes Intravenous  Once 10/25/16 0240 10/25/16 0457   10/24/16 1800  vancomycin (VANCOCIN) IVPB 750 mg/150 ml premix  Status:  Discontinued     750 mg 150 mL/hr over 60 Minutes Intravenous Every 24 hours 10/24/2016 1644 10/24/16 0839   10/24/16 1700  vancomycin (VANCOCIN) IVPB 750 mg/150 ml premix  Status:  Discontinued     750 mg 150 mL/hr over 60 Minutes Intravenous Every 24 hours 10/24/16 1114 10/26/16 0728   10/24/16 1400  piperacillin-tazobactam (ZOSYN) IVPB 3.375 g  Status:  Discontinued     3.375 g 12.5 mL/hr over 240 Minutes Intravenous Every 8 hours 10/24/16 1114 10/26/16 0739   10/24/16 1000  ceFAZolin (ANCEF) IVPB 2g/100 mL premix  Status:  Discontinued     2 g 200 mL/hr over 30 Minutes Intravenous Every 12 hours 10/24/16 0856 10/24/16 1114   10/24/16 0100  vancomycin (VANCOCIN) 50 mg/mL oral solution 500 mg  Status:  Discontinued     500 mg Per Tube Every 6 hours 10/24/16 0019 10/24/16 1107   10/22/2016 2230  piperacillin-tazobactam (ZOSYN) IVPB 3.375 g  Status:  Discontinued     3.375 g 12.5 mL/hr over 240 Minutes Intravenous Every  8 hours 11/13/2016 1932 10/24/16 0839   11/14/2016 1530  piperacillin-tazobactam (ZOSYN) IVPB 3.375 g     3.375 g 100 mL/hr over 30 Minutes Intravenous  Once 10/24/2016 1521 11/12/2016 1705   11/03/2016 1530  vancomycin (VANCOCIN) 1,500 mg in sodium chloride 0.9 % 500 mL IVPB     1,500 mg 250 mL/hr over 120 Minutes Intravenous  Once 10/29/2016 1522 10/25/2016 2027      Medications: Scheduled Meds: . budesonide (PULMICORT) nebulizer solution  0.5 mg Nebulization BID  . chlorhexidine gluconate (MEDLINE KIT)  15 mL Mouth Rinse BID  . Chlorhexidine Gluconate Cloth  6 each Topical Daily  . heparin subcutaneous  5,000 Units Subcutaneous Q8H  . ipratropium-albuterol  3 mL Nebulization Q6H  . mouth rinse  15 mL Mouth Rinse QID  . pantoprazole (PROTONIX) IV  40 mg Intravenous Q12H  . sodium chloride flush  10-40 mL Intracatheter Q12H   Continuous Infusions: . anidulafungin Stopped (11/03/16 1321)  . dextrose 50 mL/hr at 03-Nov-2016 1000  . fentaNYL infusion INTRAVENOUS 400 mcg/hr (11-03-16 1000)  . norepinephrine (LEVOPHED) Adult infusion 14 mcg/min (2016-11-03 1321)  . phenylephrine (NEO-SYNEPHRINE) Adult infusion Stopped (10/25/16 1008)  . piperacillin-tazobactam (ZOSYN)  IV 2.25 g (November 03, 2016 1429)  .  sodium bicarbonate  infusion 1000 mL 50 mL/hr at 2016/11/16 1000  . vasopressin (PITRESSIN) infusion - *FOR SHOCK* 0.03 Units/min (2016-11-16 1000)   PRN Meds:.fentaNYL, midazolam, sodium chloride flush    Objective: Weight change: 11 lb 7.4 oz (5.2 kg)  Intake/Output Summary (Last 24 hours) at 11/16/2016 1533 Last data filed at 2016-11-16 1050  Gross per 24 hour  Intake          4595.86 ml  Output              270 ml  Net          4325.86 ml   Blood pressure 104/77, pulse (!) 130, temperature (!) 102.2 F (39 C), resp. rate (!) 31, height 5' 3"  (1.6 m), weight 234 lb 2.1 oz (106.2 kg), SpO2 100 %. Temp:  [99.8 F (37.7 C)-102.7 F (39.3 C)] 102.2 F (39 C) (08/11 1400) Pulse Rate:  [123-132] 130  (08/11 1400) Resp:  [31] 31 (08/11 1121) BP: (99-140)/(53-125) 104/77 (08/11 1400) SpO2:  [94 %-100 %] 100 % (08/11 1400) Arterial Line BP: (92-138)/(52-70) 92/52 (08/11 1400) FiO2 (%):  [50 %-60 %] 60 % (08/11 1332) Weight:  [234 lb 2.1 oz (106.2 kg)] 234 lb 2.1 oz (106.2 kg) (08/11 0500)  Physical Exam: General: Intubated and sedated  HEENT:  EOMI CVS tachycardia rate Chest: On the ventilator coarse breath sounds Abdomen: soft nondistended, normal bowel sounds, Extremities: She has some mottling of her extremities with petechiae  Neuro: nonfocal  CBC: CBC Latest Ref Rng & Units 2016-11-16 10/26/2016 10/26/2016  WBC 4.0 - 10.5 K/uL 33.7(H) 34.8(H) 31.6(H)  Hemoglobin 12.0 - 15.0 g/dL 9.0(L) 9.2(L) 9.7(L)  Hematocrit 36.0 - 46.0 % 26.6(L) 27.1(L) 28.5(L)  Platelets 150 - 400 K/uL 70(L) 80(L) 81(L)     BMET  Recent Labs  10/26/16 1802 2016/11/16 0430  NA 128* 130*  K 4.8 4.9  CL 79* 80*  CO2 27 28  GLUCOSE 87 167*  BUN 51* 56*  CREATININE 4.37* 4.53*  CALCIUM 5.8* 5.9*     Liver Panel   Recent Labs  10/26/16 1802 November 16, 2016 0430  PROT 4.1* 3.8*  ALBUMIN 1.6* 1.3*  AST 1,487* 1,437*  ALT 177* 150*  ALKPHOS 166* 172*  BILITOT 4.3* 4.1*       Sedimentation Rate No results for input(s): ESRSEDRATE in the last 72 hours. C-Reactive Protein No results for input(s): CRP in the last 72 hours.  Micro Results: Recent Results (from the past 720 hour(s))  Culture, blood (routine x 2)     Status: Abnormal   Collection Time: 10/24/2016  4:15 PM  Result Value Ref Range Status   Specimen Description BLOOD  Final   Special Requests IN PEDIATRIC BOTTLE Blood Culture adequate volume  Final   Culture  Setup Time   Final    GRAM POSITIVE COCCI IN CLUSTERS IN PEDIATRIC BOTTLE CRITICAL RESULT CALLED TO, READ BACK BY AND VERIFIED WITH: Ellin Mayhew [place] [phone] MLM    Culture STAPHYLOCOCCUS AUREUS (A)  Final   Report Status 10/26/2016 FINAL  Final   Organism ID,  Bacteria STAPHYLOCOCCUS AUREUS  Final      Susceptibility   Staphylococcus aureus - MIC*    CIPROFLOXACIN <=0.5 SENSITIVE Sensitive     ERYTHROMYCIN >=8 RESISTANT Resistant     GENTAMICIN <=0.5 SENSITIVE Sensitive     OXACILLIN 0.5 SENSITIVE Sensitive     TETRACYCLINE <=1 SENSITIVE Sensitive     VANCOMYCIN <=0.5 SENSITIVE Sensitive     TRIMETH/SULFA <=10  SENSITIVE Sensitive     CLINDAMYCIN <=0.25 SENSITIVE Sensitive     RIFAMPIN <=0.5 SENSITIVE Sensitive     Inducible Clindamycin NEGATIVE Sensitive     * STAPHYLOCOCCUS AUREUS  Blood Culture ID Panel (Reflexed)     Status: Abnormal   Collection Time: 10/18/2016  4:15 PM  Result Value Ref Range Status   Enterococcus species NOT DETECTED NOT DETECTED Final   Listeria monocytogenes NOT DETECTED NOT DETECTED Final   Staphylococcus species DETECTED (A) NOT DETECTED Final    Comment: CRITICAL RESULT CALLED TO, READ BACK BY AND VERIFIED WITH: Ellin Mayhew [place] [phone] MLM    Staphylococcus aureus DETECTED (A) NOT DETECTED Final    Comment: Methicillin (oxacillin) susceptible Staphylococcus aureus (MSSA). Preferred therapy is anti staphylococcal beta lactam antibiotic (Cefazolin or Nafcillin), unless clinically contraindicated. CRITICAL RESULT CALLED TO, READ BACK BY AND VERIFIED WITH: Ailene Rud [phone] MLM    Methicillin resistance NOT DETECTED NOT DETECTED Final   Streptococcus species NOT DETECTED NOT DETECTED Final   Streptococcus agalactiae NOT DETECTED NOT DETECTED Final   Streptococcus pneumoniae NOT DETECTED NOT DETECTED Final   Streptococcus pyogenes NOT DETECTED NOT DETECTED Final   Acinetobacter baumannii NOT DETECTED NOT DETECTED Final   Enterobacteriaceae species NOT DETECTED NOT DETECTED Final   Enterobacter cloacae complex NOT DETECTED NOT DETECTED Final   Escherichia coli NOT DETECTED NOT DETECTED Final   Klebsiella oxytoca NOT DETECTED NOT DETECTED Final   Klebsiella pneumoniae NOT DETECTED NOT DETECTED Final     Proteus species NOT DETECTED NOT DETECTED Final   Serratia marcescens NOT DETECTED NOT DETECTED Final   Haemophilus influenzae NOT DETECTED NOT DETECTED Final   Neisseria meningitidis NOT DETECTED NOT DETECTED Final   Pseudomonas aeruginosa NOT DETECTED NOT DETECTED Final   Candida albicans NOT DETECTED NOT DETECTED Final   Candida glabrata NOT DETECTED NOT DETECTED Final   Candida krusei NOT DETECTED NOT DETECTED Final   Candida parapsilosis NOT DETECTED NOT DETECTED Final   Candida tropicalis NOT DETECTED NOT DETECTED Final  Urine culture     Status: None   Collection Time: 11/06/2016  5:07 PM  Result Value Ref Range Status   Specimen Description URINE, CATHETERIZED  Final   Special Requests NONE  Final   Culture NO GROWTH  Final   Report Status 10/25/2016 FINAL  Final  MRSA PCR Screening     Status: None   Collection Time: 11/04/2016 10:45 PM  Result Value Ref Range Status   MRSA by PCR NEGATIVE NEGATIVE Final    Comment:        The GeneXpert MRSA Assay (FDA approved for NASAL specimens only), is one component of a comprehensive MRSA colonization surveillance program. It is not intended to diagnose MRSA infection nor to guide or monitor treatment for MRSA infections.   Culture, blood (routine x 2)     Status: Abnormal   Collection Time: 11/01/2016 11:10 PM  Result Value Ref Range Status   Specimen Description BLOOD LEFT HAND  Final   Special Requests IN PEDIATRIC BOTTLE Blood Culture adequate volume  Final   Culture  Setup Time   Final    GRAM POSITIVE COCCI IN CLUSTERS IN PEDIATRIC BOTTLE CRITICAL VALUE NOTED.  VALUE IS CONSISTENT WITH PREVIOUSLY REPORTED AND CALLED VALUE.    Culture (A)  Final    STAPHYLOCOCCUS AUREUS SUSCEPTIBILITIES PERFORMED ON PREVIOUS CULTURE WITHIN THE LAST 5 DAYS.    Report Status 10/26/2016 FINAL  Final  Culture, respiratory (NON-Expectorated)  Status: None (Preliminary result)   Collection Time: 10/24/16  3:23 PM  Result Value Ref Range  Status   Specimen Description TRACHEAL ASPIRATE  Final   Special Requests NONE  Final   Gram Stain   Final    RARE WBC PRESENT,BOTH PMN AND MONONUCLEAR MODERATE YEAST    Culture   Final    ABUNDANT CANDIDA ALBICANS RARE MOLD FUNGUS (MOLD) ISOLATED, PROBABLE CONTAMINANT/COLONIZER (SAPROPHYTE). CONTACT MICROBIOLOGY IF FURTHER IDENTIFICATION REQUIRED [phone].    Report Status PENDING  Incomplete  Culture, blood (routine x 2)     Status: None (Preliminary result)   Collection Time: 10/25/16  4:12 AM  Result Value Ref Range Status   Specimen Description BLOOD RIGHT FOOT  Final   Special Requests   Final    BOTTLES DRAWN AEROBIC ONLY Blood Culture adequate volume   Culture  Setup Time   Final    YEAST AEROBIC BOTTLE ONLY Organism ID to follow CRITICAL RESULT CALLED TO, READ BACK BY AND VERIFIED WITH: H BAIRD 11-16-2016 @ 27 M VESTAL    Culture YEAST  Final   Report Status PENDING  Incomplete  Fungus culture, blood     Status: None (Preliminary result)   Collection Time: 10/25/16  4:12 AM  Result Value Ref Range Status   Specimen Description BLOOD RIGHT FOOT  Final   Special Requests   Final    BOTTLES DRAWN AEROBIC ONLY Blood Culture adequate volume   Culture NO GROWTH 2 DAYS  Final   Report Status PENDING  Incomplete  Blood Culture ID Panel (Reflexed)     Status: Abnormal   Collection Time: 10/25/16  4:12 AM  Result Value Ref Range Status   Enterococcus species NOT DETECTED NOT DETECTED Final   Vancomycin resistance NOT DETECTED NOT DETECTED Final   Listeria monocytogenes NOT DETECTED NOT DETECTED Final   Staphylococcus species NOT DETECTED NOT DETECTED Final   Staphylococcus aureus NOT DETECTED NOT DETECTED Final   Methicillin resistance NOT DETECTED NOT DETECTED Final   Streptococcus species NOT DETECTED NOT DETECTED Final   Streptococcus agalactiae NOT DETECTED NOT DETECTED Final   Streptococcus pneumoniae NOT DETECTED NOT DETECTED Final   Streptococcus pyogenes NOT  DETECTED NOT DETECTED Final   Acinetobacter baumannii NOT DETECTED NOT DETECTED Final   Enterobacteriaceae species NOT DETECTED NOT DETECTED Final   Enterobacter cloacae complex NOT DETECTED NOT DETECTED Final   Escherichia coli NOT DETECTED NOT DETECTED Final   Klebsiella oxytoca NOT DETECTED NOT DETECTED Final   Klebsiella pneumoniae NOT DETECTED NOT DETECTED Final   Proteus species NOT DETECTED NOT DETECTED Final   Serratia marcescens NOT DETECTED NOT DETECTED Final   Carbapenem resistance NOT DETECTED NOT DETECTED Final   Haemophilus influenzae NOT DETECTED NOT DETECTED Final   Neisseria meningitidis NOT DETECTED NOT DETECTED Final   Pseudomonas aeruginosa NOT DETECTED NOT DETECTED Final   Candida albicans DETECTED (A) NOT DETECTED Final    Comment: CRITICAL RESULT CALLED TO, READ BACK BY AND VERIFIED WITH: H BAIRD 11/16/2016 @ 0912 M VESTAL    Candida glabrata NOT DETECTED NOT DETECTED Final   Candida krusei NOT DETECTED NOT DETECTED Final   Candida parapsilosis NOT DETECTED NOT DETECTED Final   Candida tropicalis NOT DETECTED NOT DETECTED Final  Culture, blood (routine x 2)     Status: None (Preliminary result)   Collection Time: 10/25/16  4:16 AM  Result Value Ref Range Status   Specimen Description BLOOD BLOOD RIGHT FOREARM  Final   Special Requests  Final    BOTTLES DRAWN AEROBIC ONLY Blood Culture adequate volume   Culture NO GROWTH 2 DAYS  Final   Report Status PENDING  Incomplete    Studies/Results: No results found.    Assessment/Plan:  INTERVAL HISTORY: Candida albicans isolated from blood cultures   Active Problems:   Sepsis ([place])   Staphylococcus aureus bacteremia with sepsis ([place])   Ventilator dependent ([facility])   PEA (Pulseless electrical activity) ([facility])   IVDU (intravenous drug user)   Gastrointestinal hemorrhage with hematemesis   Pulmonary embolus ([place])   Acute renal failure (ARF) ([facility])   Severe sepsis with septic shock [facility])   Palliative care by  specialist    Mallory Medina is a 51 y.o. female with  of COPD IV drug use who had been seen in the ER earlier in August diagnosis COPD exact exacerbation and sent home but then readmitted with septic shock and a PEA cardiac arrest She's been found to have bacteremia with methicillin sensitive Staphylococcus aureus. She has PNA and pulmonary embolism (thought not on anticoagulation currently due to bleeding, shock)  She had evidence of a GI bleed and there was concern for potential secondary pathology such as a gastric ref ration and she has failed to improve on antibiotics targeted for staphylococcus aureus. She is now growing Candida albicans from blood culture as well. She continues to be critically ill with a very grim prognosis. She is on maximal pressor support and now developing mottling of her extremities.  #1 Staphylococcus aureus bacteremia: Zosyn will cover her methicillin sensitive Staphylococcus aureus. She is too sick for S to execute all the requirements to cure this type of infection such as removal of lines and pursuit of transesophageal echocardiograms.  #2 Candida albicans fungemia: could be due to IVDU but family denies she has been using recently, vs gastric perforation that we have worried about  I am repeating blood cultures, as with the Staphylococcus aureus we cannot remove lines to help ensure cure of this infection.  #3 Septic shock: primary driver is likely the MSSA but now also with fungemia and concern for GI pathology such as perforated ulcer  #4 Goals of care: I had lengthy discussion with her brother and her sister-in-law and family with regards to the patient's prognosis which I feel is very poor. I was fairly frankly I do not think that she would survive her hospitalization.  Hopefully type of care can further help the family as they grapple with this very difficult situation.  I spent greater than 35  minutes with the patient including greater than 50% of time in  face to face counsel of the patient's surrogate re her several different infections her poor prognosis, goals of care and in coordination of her  care.   We will continue    LOS: 4 days   Alcide Evener 30-Oct-2016, 3:33 PM

## 2016-11-17 NOTE — Progress Notes (Addendum)
Daily Progress Note   Patient Name: Mallory Medina       Date: 2016/10/31 DOB: 12/07/1965  Age: 51 y.o. MRN#: [id number] Attending Physician: Javier Glazier, MD Primary Care Physician: Patient, No Pcp Per Admit Date: 11/09/2016  Reason for Consultation/Follow-up: Establishing goals of care, Psychosocial/spiritual support, Terminal Care and Withdrawal of life-sustaining treatment  Subjective: Pt is critically ill on ventilator with pressor support, abx, now with no urine output. Angiotensin II has been DC'd. Beginning to spike fever  Length of Stay: 4  Current Medications: Scheduled Meds:  . budesonide (PULMICORT) nebulizer solution  0.5 mg Nebulization BID  . chlorhexidine gluconate (MEDLINE KIT)  15 mL Mouth Rinse BID  . Chlorhexidine Gluconate Cloth  6 each Topical Daily  . heparin subcutaneous  5,000 Units Subcutaneous Q8H  . ipratropium-albuterol  3 mL Nebulization Q6H  . mouth rinse  15 mL Mouth Rinse QID  . pantoprazole (PROTONIX) IV  40 mg Intravenous Q12H  . sodium chloride flush  10-40 mL Intracatheter Q12H    Continuous Infusions: . anidulafungin Stopped (2016/10/31 1321)  . dextrose 50 mL/hr at 10/31/16 1000  . fentaNYL infusion INTRAVENOUS 400 mcg/hr (2016-10-31 1000)  . norepinephrine (LEVOPHED) Adult infusion 22 mcg/min (10/31/2016 1105)  . phenylephrine (NEO-SYNEPHRINE) Adult infusion Stopped (10/25/16 1008)  . piperacillin-tazobactam (ZOSYN)  IV Stopped (10-31-16 1761)  .  sodium bicarbonate  infusion 1000 mL 50 mL/hr at 2016-10-31 1000  . vasopressin (PITRESSIN) infusion - *FOR SHOCK* 0.03 Units/min (2016/10/31 1000)    PRN Meds: fentaNYL, midazolam, sodium chloride flush  Physical Exam  Constitutional: She appears well-developed and well-nourished.  Acutely ill  appearing female seen in ICU; she is intubated, on pressors Anasarca  Cardiovascular:  tachycardic  Pulmonary/Chest:  Patient is intubated  Abdominal: Soft.  Genitourinary:  Genitourinary Comments: Foley. No urine output Rectal tube  Skin:  Anasarca with areas of ecchymosis to bilateral flank as well as right shoulder. Blisters noted 2 ecchymotic area to right flank Feet are mottled  Psychiatric:  Unable to test. Patient is intubated  Nursing note and vitals reviewed.           Vital Signs: BP 125/89   Pulse (!) 127   Temp (!) 100.9 F (38.3 C)   Resp (!) 31   Ht _0  (1.6 m)   Wt  106.2 kg (234 lb 2.1 oz)   SpO2 99%   BMI 41.47 kg/m  SpO2: SpO2: 99 % O2 Device: O2 Device: Ventilator O2 Flow Rate:    Intake/output summary:  Intake/Output Summary (Last 24 hours) at 11/08/16 1214 Last data filed at 11-08-2016 1050  Gross per 24 hour  Intake          5272.26 ml  Output              270 ml  Net          5002.26 ml   LBM: Last BM Date:  (not known) Baseline Weight: Weight: 79.4 kg (175 lb 0.7 oz) Most recent weight: Weight: 106.2 kg (234 lb 2.1 oz)       Palliative Assessment/Data:    Flowsheet Rows     Most Recent Value  Intake Tab  Referral Department  Critical care  Unit at Time of Referral  ICU  Palliative Care Primary Diagnosis  Sepsis/Infectious Disease  Date Notified  10/26/16  Palliative Care Type  New Palliative care  Reason for referral  Clarify Goals of Care, Psychosocial or Spiritual support, End of Life Care Assistance  Date of Admission  10/20/2016  Date first seen by Palliative Care  10/26/16  # of days Palliative referral response time  0 Day(s)  # of days IP prior to Palliative referral  3  Clinical Assessment  Palliative Performance Scale Score  20%  Pain Max last 24 hours  Not able to report  Pain Min Last 24 hours  Not able to report  Dyspnea Max Last 24 Hours  Not able to report  Dyspnea Min Last 24 hours  Not able to report  Nausea Max  Last 24 Hours  Not able to report  Nausea Min Last 24 Hours  Not able to report  Anxiety Max Last 24 Hours  Not able to report  Anxiety Min Last 24 Hours  Not able to report  Other Max Last 24 Hours  Not able to report  Psychosocial & Spiritual Assessment  Palliative Care Outcomes  Patient/Family meeting held?  Yes  Who was at the meeting?  brother, daughter, 2 adopted daughers  Palliative Care Outcomes  Provided psychosocial or spiritual support  Patient/Family wishes: Interventions discontinued/not started   PEG, Trach  Palliative Care follow-up planned  Yes, Facility      Patient Active Problem List   Diagnosis Date Noted  . Palliative care by specialist   . Acute renal failure (ARF) ([place])   . Severe sepsis with septic shock ([place])   . Staphylococcus aureus bacteremia with sepsis ([place])   . Ventilator dependent ([place])   . PEA (Pulseless electrical activity) ([place])   . IVDU (intravenous drug user)   . Gastrointestinal hemorrhage with hematemesis   . Pulmonary embolus ([place])   . Sepsis ([place]) 11/04/2016  . Anxiety state, unspecified 11/30/2012  . Unspecified constipation 11/30/2012  . Hyperglycemia 11/28/2012  . Elevated blood pressure 11/28/2012  . COPD with acute exacerbation ([place]) 11/28/2012  . CAP (community acquired pneumonia) 11/28/2012  . Thrombocytopenia, unspecified ([place]) 11/28/2012  . Cigarette nicotine dependence with withdrawal 11/28/2012  . Narcotic dependence ([place]) 11/28/2012  . Acute respiratory failure ([place]) 11/27/2012    Palliative Care Assessment & Plan   Patient Profile: 51 y.o. female  with past medical history of Chronic pain, COPD, diabetes, hepatitis C, hypertension, remote use of IV drugs, history of Tylenol overdose approximately 10 years ago admitted on 11/13/2016 with lethargy, hypotension. When EMS  arrived at her home oxygen sats were 78% on room air. Upon arrival to the emergency room, blood pressure was 97/86, white blood cell count 18.7, lactic acid  level was 16.58. She was given 3 L of normal saline and her mental status began to improve. Patient had been so ill, she had been bed bound for the previous 3 days and had been experiencing nausea and vomiting and diarrhea during this time as well. Critical care medicine admitted patient. Later, patient arrested in the emergency room. She is currently intubated, on pressors.. She is on a continuous fentanyl infusion, Versed 1-4 mg as needed, Levophed, vaspressin, and Darryl Nestle has been stopped. She is on Fentanyl gtt 400 mcg/hr   Assessment: Family at bedside. Updated as to current clinical condition and had family mtg with 2 of 3 daughters, Hal Hope and Lelan Pons, as well as brother Eddie Dibbles and his wife were present for discussion. All family members present today were in agreement that patient had shared with them verbally her wishes not to be maintained on life support, no hemodialysis and to allow a natural death. I explained the process of ensuring comfort with the usage of medications to manage any shortness of breath, anxiety with withdrawal of pressor support and extubation. All questions were answered. Daughters are tearful but state they don't want to see their mother suffering any longer  Recommendations/Plan:  Plan is to withdraw pressor support, extubate, stop antibiotics and move to full comfort care when family informs staff that everyone has been to see patient that wants to see her. I anticipate that happening today. They are prepared for death to be a matter of minutes to hours, or or potentially days however less likely. Staffed with Dr. Ashok Cordia outcomes of family meeting, Dr. Ashok Cordia to update e-link as to possible withdrawal of care this evening  Continue with fentanyl infusion. Would recommend adding Versed continuous infusion with judicious tiration parameters  Goals of Care and Additional Recommendations:  Limitations on Scope of Treatment: Full Comfort Care  Code Status:    Code  Status Orders        Start     Ordered   10/26/16 1000  Limited resuscitation (code)  Continuous    Question Answer Comment  In the event of cardiac or respiratory ARREST: Initiate Code Blue, Call Rapid Response No   In the event of cardiac or respiratory ARREST: Perform CPR No   In the event of cardiac or respiratory ARREST: Perform Intubation/Mechanical Ventilation Yes   In the event of cardiac or respiratory ARREST: Use NIPPV/BiPAp only if indicated No   In the event of cardiac or respiratory ARREST: Administer ACLS medications if indicated No   In the event of cardiac or respiratory ARREST: Perform Defibrillation or Cardioversion if indicated No   Comments Continue pressures and ventilator.      10/26/16 0959    Code Status History    Date Active Date Inactive Code Status Order ID Comments User Context   11/06/2016  8:00 PM 10/26/2016  9:59 AM Full Code [id number]  Omar Person, NP ED   11/27/2012  3:42 AM 12/01/2012  4:33 PM Full Code [id number]  Rise Patience, MD Inpatient       Prognosis:   Hours - Days  Discharge Planning:  Anticipated Hospital Death  Care plan was discussed with Dr. Ashok Cordia, bedside RN, Timmothy Sours.  Thank you for allowing the Palliative Medicine Team to assist in the care of this patient.   Time In: 1200 Time  Out: 1300 Total Time 60 min Prolonged Time Billed  no       Greater than 50%  of this time was spent counseling and coordinating care related to the above assessment and plan.  Dory Horn, NP  Please contact Palliative Medicine Team phone at [phone] for questions and concerns.

## 2016-11-17 NOTE — Progress Notes (Signed)
  PHARMACY - PHYSICIAN COMMUNICATION CRITICAL VALUE ALERT - BLOOD CULTURE IDENTIFICATION (BCID)  Results for orders placed or performed during the hospital encounter of 05-Aug-2016  Blood Culture ID Panel (Reflexed) (Collected: 10/25/2016  4:12 AM)  Result Value Ref Range   Enterococcus species NOT DETECTED NOT DETECTED   Vancomycin resistance NOT DETECTED NOT DETECTED   Listeria monocytogenes NOT DETECTED NOT DETECTED   Staphylococcus species NOT DETECTED NOT DETECTED   Staphylococcus aureus NOT DETECTED NOT DETECTED   Methicillin resistance NOT DETECTED NOT DETECTED   Streptococcus species NOT DETECTED NOT DETECTED   Streptococcus agalactiae NOT DETECTED NOT DETECTED   Streptococcus pneumoniae NOT DETECTED NOT DETECTED   Streptococcus pyogenes NOT DETECTED NOT DETECTED   Acinetobacter baumannii NOT DETECTED NOT DETECTED   Enterobacteriaceae species NOT DETECTED NOT DETECTED   Enterobacter cloacae complex NOT DETECTED NOT DETECTED   Escherichia coli NOT DETECTED NOT DETECTED   Klebsiella oxytoca NOT DETECTED NOT DETECTED   Klebsiella pneumoniae NOT DETECTED NOT DETECTED   Proteus species NOT DETECTED NOT DETECTED   Serratia marcescens NOT DETECTED NOT DETECTED   Carbapenem resistance NOT DETECTED NOT DETECTED   Haemophilus influenzae NOT DETECTED NOT DETECTED   Neisseria meningitidis NOT DETECTED NOT DETECTED   Pseudomonas aeruginosa NOT DETECTED NOT DETECTED   Candida albicans DETECTED (A) NOT DETECTED   Candida glabrata NOT DETECTED NOT DETECTED   Candida krusei NOT DETECTED NOT DETECTED   Candida parapsilosis NOT DETECTED NOT DETECTED   Candida tropicalis NOT DETECTED NOT DETECTED   Continuing on Zosyn for MSSA bacteremia, now with 1/2 BCx + for yeast (BCID candida albicans). Already on Eraxis. [facility] as is [facility] and f/u with ID and family for GOC.  Name of physician (or Provider) Contacted: Nestor  Changes to prescribed antibiotics required:  none   Babs BertinHaley Syniyah Bourne, PharmD, BCPS Clinical Pharmacist 10/18/2016 10:06 AM

## 2016-11-17 NOTE — Progress Notes (Signed)
Pt compassionately extubated and pressors turned off. Continued fentanyl and started versed gtt for comfort. Family at bedside with chaplain. Pt expired at 2140. No heart sounds on auscultation. Second verifier Cathrine MusterErika Braddock RN. Dr Dellie CatholicSommers notified.

## 2016-11-17 NOTE — Discharge Summary (Signed)
DEATH NOTE: For a complete accounting of the patient's history and physical exam on presentation please refer to the H&P dictated on 10/17/2016. Patient was a 51 year old female with past medical history of COPD, diabetes mellitus, hepatitis C, and essential hypertension. Patient also had a known history of IV drug use in the past. Present on the 8/7 with shock and altered mentation. Patient was hypoxic to 78% on room air and ultimately required endotracheal intubation. Patient had a productive cough for several days as well as generalized pain and fatigue. Patient had essentially been bedbound for 3 days prior as well. She had no nausea, vomiting, or diarrhea. Of note the patient had been seen in the emergency department on 8/4 with back pain and dyspnea with a productive cough 3 days prior. Patient was discharged on bronchodilators, steroids, and azithromycin. Patient was noted to have elevated transaminases on presentation and was started on broad-spectrum antibiotic coverage for presumed septic shock. On presentation patient was noted to have coffee-ground stomach contents as well. The patient's clinical status continued to clinically deteriorate. She developed a profound coagulopathy with her liver failure along with anuric renal failure. She was found to have MSSA bacteremia as well as questionable fungemia. Patient was continued on broad-spectrum anaerobic coverage with vancomycin, Zosyn, and Eraxis for treatment. She had questionable thrombus within her right ventricle as well as a right lower lobe pulmonary embolism that could have been secondary to a septic embolism. Initially she was on systemic anticoagulation with heparin which was discontinued due to worsening anemia and possible bleeding. Given the patient's severity of illness and multisystem organ failure palliative medicine was consulted. Infectious Diseases was also consulted and provided further guidance on antibiotics. In the setting of ongoing  septic shock and multisystem organ failure multiple family discussions were held with the patient's children as well as her brother and sister-in-law. Ultimately, they all felt the patient would not wish to be maintained on mechanical ventilation and vasopressor infusions knowing that her recovery process would be long and that she would not wish to undergo hemodialysis even for a short period of time. They decided to transition to full comfort care and the patient underwent ventilator weaning and one way extubation per protocol. Patient was extubated at 9:29 PM on 11/03/2016 per documentation by respiratory therapist. Fentanyl was used for comfort at that time. Patient's vasopressors were discontinued. Patient passed comfortably at 9:40 PM on 10/17/2016. Hospital chaplain provided spiritual support the patient's family.  DIAGNOSES AT DEATH: 1. Acute hypoxic respiratory failure 2. Acute encephalopathy 3. Severe sepsis with septic shock 4. Metabolic & lactic acidosis 5. Acute oliguric renal failure 6. Right ventricular thrombus 7. Right lung pulmonary embolism 8. Acute upper gastrointestinal bleeding 9. Coagulopathy 10. Acute liver failure 11. Methicillin sensitive Staphylococcus aureus bacteremia 12. History of COPD 13. History of essential hypertension 14. Hepatic steatosis seen on abdominal ultrasound 15. History of hepatitis C viral infection 16. History of diabetes mellitus 17. Chronic pain

## 2016-11-17 DEATH — deceased

## 2018-07-10 IMAGING — CT CT ANGIO CHEST
3 of 7 series · 18 of 36 positions shown · IV contrast (APPLIED)
Comparison: Chest radiograph from earlier today. 10/23/2016 chest
CT.

CLINICAL DATA: Inpatient. Respiratory failure. Trauma. Clinical
high pretest probability for PE.

EXAM:
CT ANGIOGRAPHY CHEST WITH CONTRAST
TECHNIQUE: Multidetector CT imaging of the chest was performed using the
standard protocol during bolus administration of intravenous
contrast. Multiplanar CT image reconstructions and MIPs were
obtained to evaluate the vascular anatomy.
CONTRAST:  72 cc Isovue 370 IV.

[Series 8: lung · axial · 0.66mm/px · z∈[+1406,+1522]mm · 3 of 145 slices shown]
[im 29/145  mediastinal]
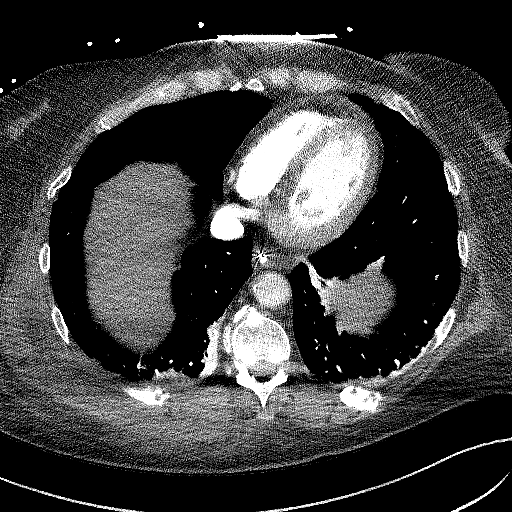
[im 58/145  mediastinal]
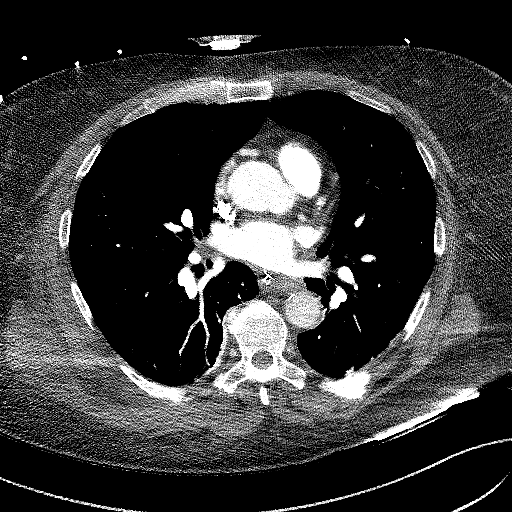
[im 87/145  mediastinal]
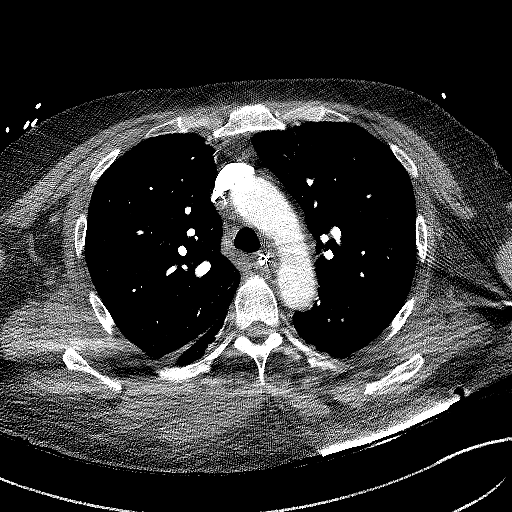

[Series 9: thins · axial · 0.66mm/px · z∈[+1367,+1618]mm · 14 of 414 slices shown]
[im 28/414  lung]
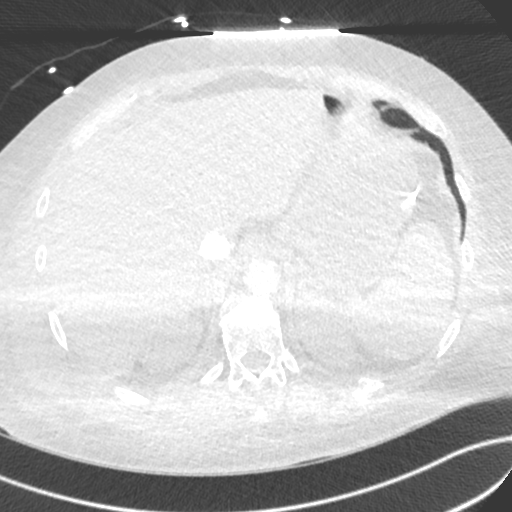
[im 56/414  mediastinal]
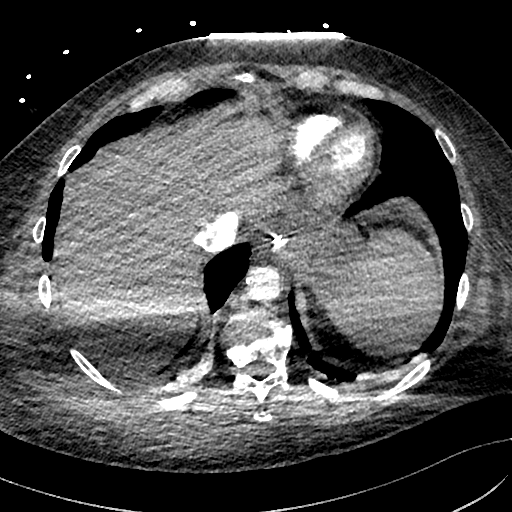
[im 83/414  lung]
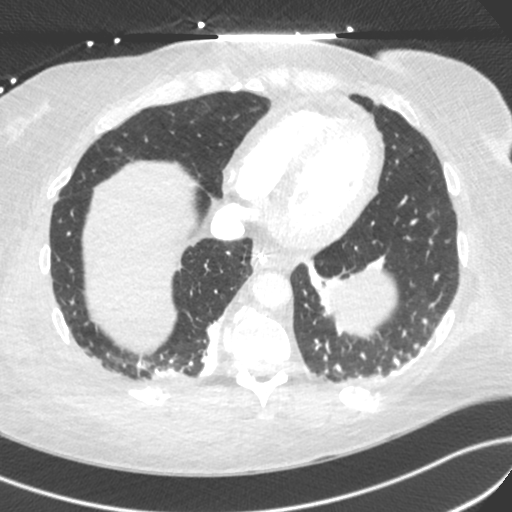
[im 111/414  mediastinal]
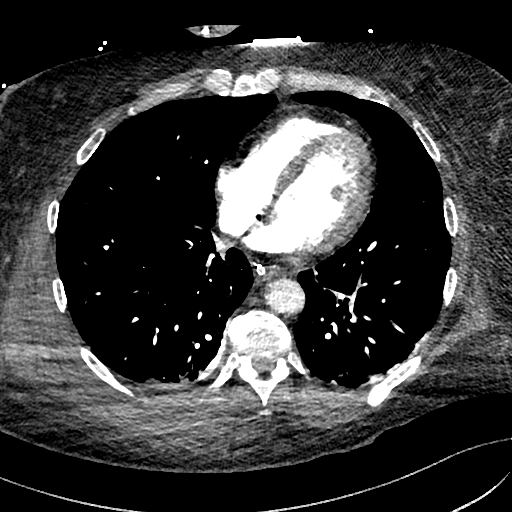
[im 138/414  lung]
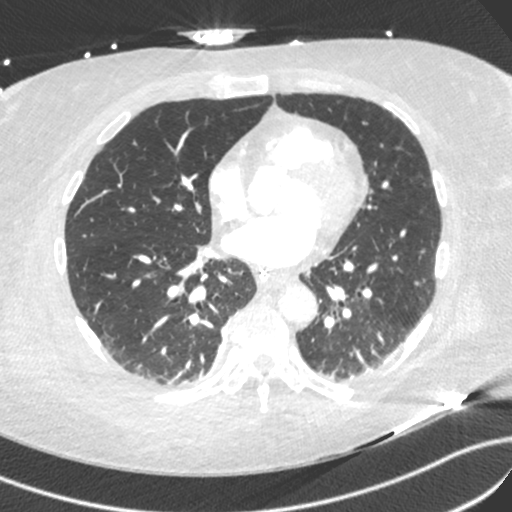
[im 166/414  mediastinal]
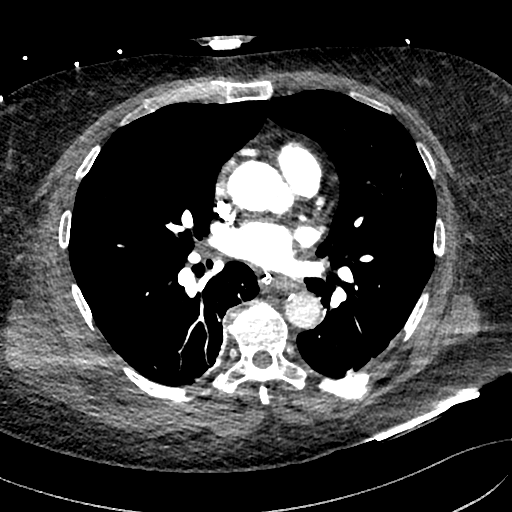
[im 193/414  lung]
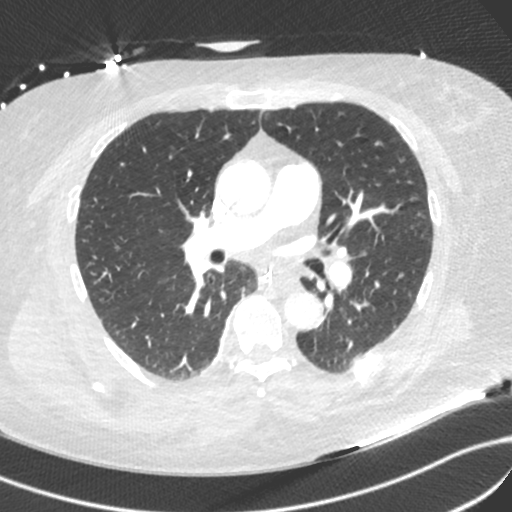
[im 221/414  mediastinal]
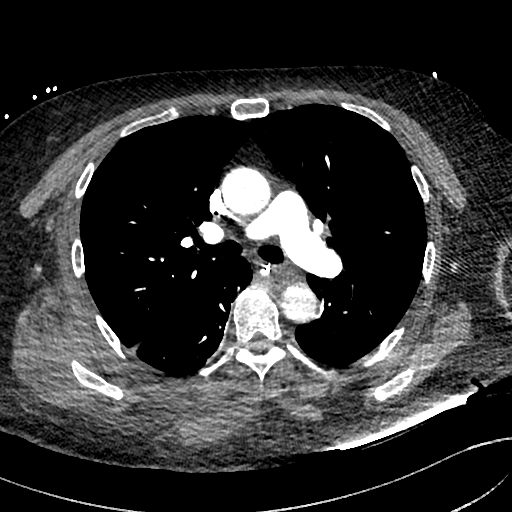
[im 248/414  lung]
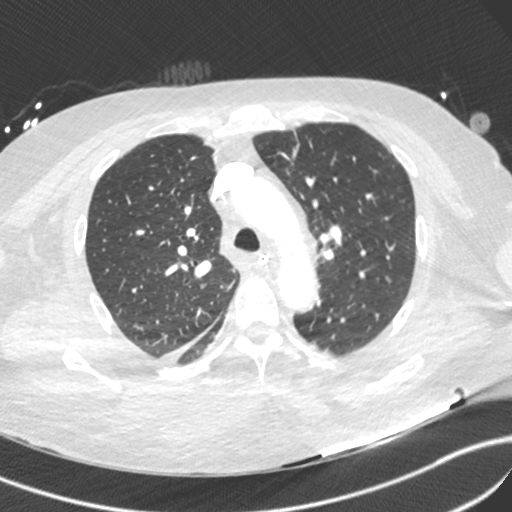
[im 276/414  mediastinal]
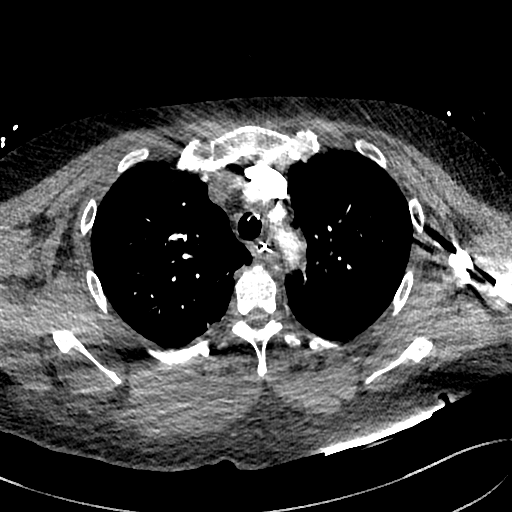
[im 303/414  lung]
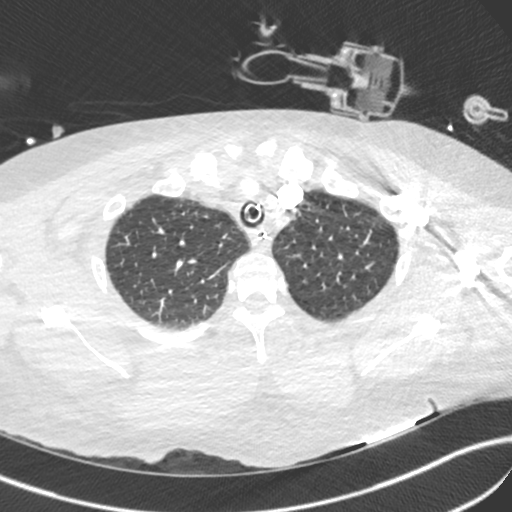
[im 331/414  mediastinal]
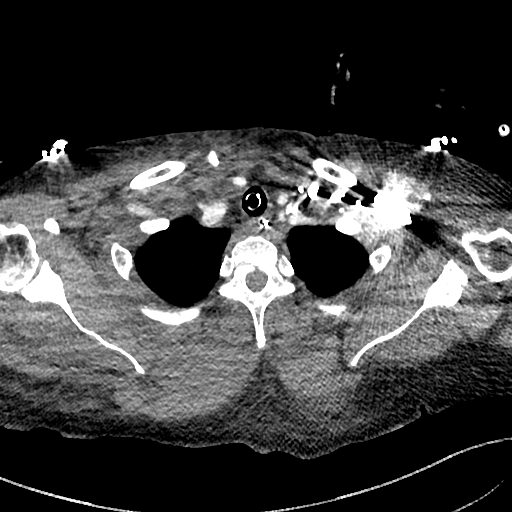
[im 358/414  lung]
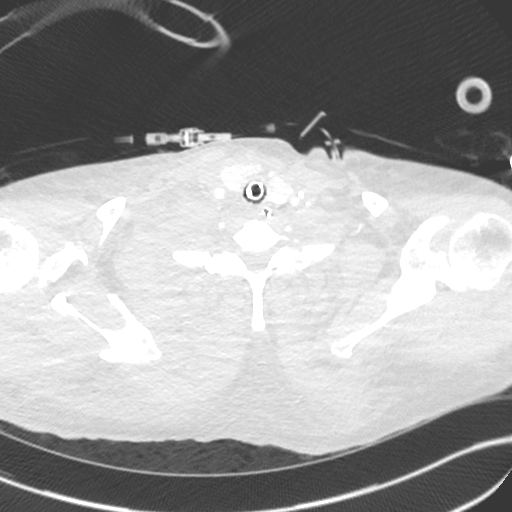
[im 386/414  mediastinal]
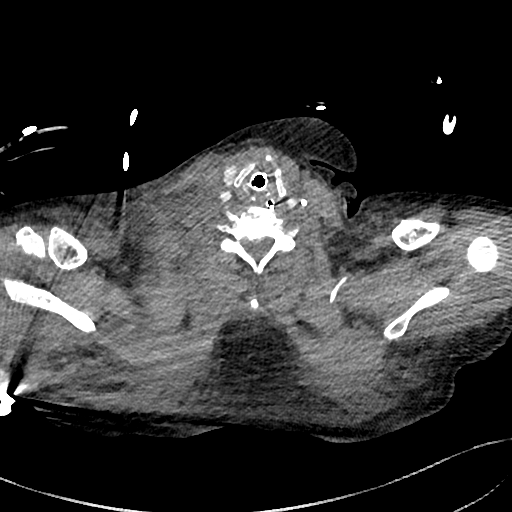

[Series 10: cor · coronal · 0.59mm/px · 1 of 146 slices shown]
[im 73/146  mediastinal]
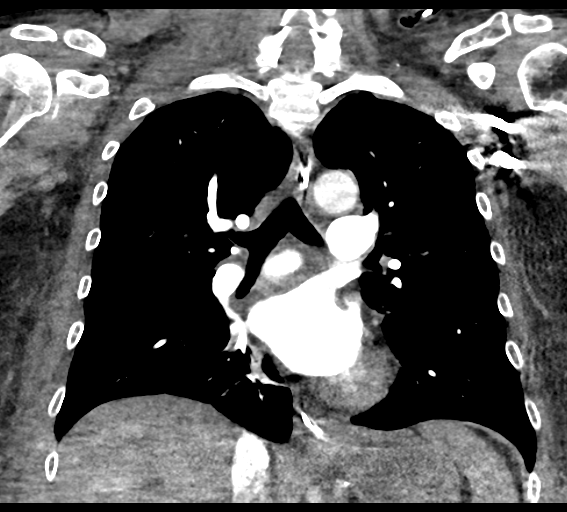

[18 of 36 positions shown; findings below may reference images not displayed]

FINDINGS: Cardiovascular: The study is high quality for the evaluation of
pulmonary embolism. There is an isolated subsegmental pulmonary
embolus in the right lower lobe (series 9/ image 314). Otherwise
normal pulmonary arterial tree with no central, lobar or segmental
pulmonary embolism. Mildly atherosclerotic nonaneurysmal thoracic
aorta. Normal caliber main pulmonary artery (2.9 cm diameter).
Normal heart size. No significant pericardial fluid/thickening. Left
internal jugular central venous catheter terminates in the upper
third of the superior vena cava.

Mediastinum/Nodes: No discrete thyroid nodules. Unremarkable
esophagus. Enteric tube terminates in the proximal body of the
stomach. No pathologically enlarged axillary, mediastinal or hilar
lymph nodes.

Lungs/Pleura: No pneumothorax. No pleural effusion. Endotracheal
tube tip is 3.5 cm above the carina. Mild atelectasis in the
dependent and basilar lungs bilaterally. Otherwise no acute
consolidative airspace disease, lung masses or significant pulmonary
nodules in the aerated portions of the lungs.

Upper abdomen: Unremarkable.

Musculoskeletal: No aggressive appearing focal osseous lesions.
Acute anterior right fifth rib fracture. Multiple healing fractures
in the posterior ribs bilaterally. Stable mild to moderate T3
vertebral compression fracture. Stable severe T11 vertebral
compression fracture. Mild thoracic spondylosis. Subcutaneous edema
at throughout the right greater the left supraclavicular regions
bilaterally and throughout the dependent back, right greater the
left.

Review of the MIP images confirms the above findings.
IMPRESSION: 1. Isolated acute subsegmental pulmonary embolism in the right lower
lobe.
2. Well-positioned support structures.
3. Mild atelectasis in the dependent and basilar lungs bilaterally.
4. Anterior right fifth rib acute fracture. Multiple healing
bilateral posterior rib fractures bilaterally. Stable T3 and T11
vertebral compression fractures.

Aortic Atherosclerosis ([id number]).

Critical Value/emergent results were called by telephone at the time
of interpretation on 10/24/2016 at [DATE] to Dr. IMACULADA, who
verbally acknowledged these results.
